# Patient Record
Sex: Male | Born: 1955 | Race: White | Hispanic: No | Smoking: Never smoker
Health system: Southern US, Community
[De-identification: ages and names within clinical notes are randomized; demographics above are authoritative.]

## PROBLEM LIST (undated history)

## (undated) DIAGNOSIS — I219 Acute myocardial infarction, unspecified: Secondary | ICD-10-CM

## (undated) DIAGNOSIS — T148XXA Other injury of unspecified body region, initial encounter: Secondary | ICD-10-CM

## (undated) DIAGNOSIS — J189 Pneumonia, unspecified organism: Secondary | ICD-10-CM

## (undated) DIAGNOSIS — I10 Essential (primary) hypertension: Secondary | ICD-10-CM

## (undated) DIAGNOSIS — I4891 Unspecified atrial fibrillation: Secondary | ICD-10-CM

## (undated) DIAGNOSIS — G4700 Insomnia, unspecified: Secondary | ICD-10-CM

## (undated) DIAGNOSIS — I2510 Atherosclerotic heart disease of native coronary artery without angina pectoris: Secondary | ICD-10-CM

## (undated) DIAGNOSIS — Z8719 Personal history of other diseases of the digestive system: Secondary | ICD-10-CM

## (undated) DIAGNOSIS — I509 Heart failure, unspecified: Secondary | ICD-10-CM

## (undated) DIAGNOSIS — R350 Frequency of micturition: Secondary | ICD-10-CM

## (undated) DIAGNOSIS — J45909 Unspecified asthma, uncomplicated: Secondary | ICD-10-CM

## (undated) DIAGNOSIS — K7460 Unspecified cirrhosis of liver: Secondary | ICD-10-CM

## (undated) DIAGNOSIS — Z950 Presence of cardiac pacemaker: Secondary | ICD-10-CM

## (undated) DIAGNOSIS — N183 Chronic kidney disease, stage 3 unspecified: Secondary | ICD-10-CM

## (undated) DIAGNOSIS — K529 Noninfective gastroenteritis and colitis, unspecified: Secondary | ICD-10-CM

## (undated) DIAGNOSIS — R3915 Urgency of urination: Secondary | ICD-10-CM

## (undated) DIAGNOSIS — M109 Gout, unspecified: Secondary | ICD-10-CM

## (undated) DIAGNOSIS — F329 Major depressive disorder, single episode, unspecified: Secondary | ICD-10-CM

## (undated) DIAGNOSIS — K649 Unspecified hemorrhoids: Secondary | ICD-10-CM

## (undated) DIAGNOSIS — R112 Nausea with vomiting, unspecified: Secondary | ICD-10-CM

## (undated) DIAGNOSIS — F32A Depression, unspecified: Secondary | ICD-10-CM

## (undated) DIAGNOSIS — G629 Polyneuropathy, unspecified: Secondary | ICD-10-CM

## (undated) DIAGNOSIS — N400 Enlarged prostate without lower urinary tract symptoms: Secondary | ICD-10-CM

## (undated) DIAGNOSIS — Z95810 Presence of automatic (implantable) cardiac defibrillator: Secondary | ICD-10-CM

## (undated) DIAGNOSIS — E785 Hyperlipidemia, unspecified: Secondary | ICD-10-CM

## (undated) DIAGNOSIS — Z9889 Other specified postprocedural states: Secondary | ICD-10-CM

## (undated) DIAGNOSIS — K219 Gastro-esophageal reflux disease without esophagitis: Secondary | ICD-10-CM

## (undated) DIAGNOSIS — E11610 Type 2 diabetes mellitus with diabetic neuropathic arthropathy: Secondary | ICD-10-CM

## (undated) DIAGNOSIS — K5909 Other constipation: Secondary | ICD-10-CM

## (undated) DIAGNOSIS — R0602 Shortness of breath: Secondary | ICD-10-CM

## (undated) DIAGNOSIS — I639 Cerebral infarction, unspecified: Secondary | ICD-10-CM

## (undated) DIAGNOSIS — K759 Inflammatory liver disease, unspecified: Secondary | ICD-10-CM

## (undated) DIAGNOSIS — Z87440 Personal history of urinary (tract) infections: Secondary | ICD-10-CM

## (undated) DIAGNOSIS — M1990 Unspecified osteoarthritis, unspecified site: Secondary | ICD-10-CM

## (undated) HISTORY — PX: OTHER SURGICAL HISTORY: SHX169

## (undated) HISTORY — PX: ESOPHAGOGASTRODUODENOSCOPY: SHX1529

## (undated) HISTORY — PX: CARDIAC CATHETERIZATION: SHX172

## (undated) HISTORY — PX: COLONOSCOPY: SHX174

## (undated) HISTORY — PX: TOE AMPUTATION: SHX809

---

## 1998-03-03 ENCOUNTER — Inpatient Hospital Stay (HOSPITAL_COMMUNITY): Admission: EM | Admit: 1998-03-03 | Discharge: 1998-03-08 | Payer: Self-pay | Admitting: Emergency Medicine

## 1998-03-10 ENCOUNTER — Other Ambulatory Visit: Admission: RE | Admit: 1998-03-10 | Discharge: 1998-03-10 | Payer: Self-pay

## 1998-04-08 ENCOUNTER — Other Ambulatory Visit: Admission: RE | Admit: 1998-04-08 | Discharge: 1998-04-08 | Payer: Self-pay

## 1998-04-27 ENCOUNTER — Other Ambulatory Visit: Admission: RE | Admit: 1998-04-27 | Discharge: 1998-04-27 | Payer: Self-pay

## 1998-05-23 ENCOUNTER — Inpatient Hospital Stay (HOSPITAL_COMMUNITY): Admission: EM | Admit: 1998-05-23 | Discharge: 1998-05-26 | Payer: Self-pay | Admitting: Emergency Medicine

## 1998-06-02 ENCOUNTER — Other Ambulatory Visit: Admission: RE | Admit: 1998-06-02 | Discharge: 1998-06-02 | Payer: Self-pay

## 1998-06-14 ENCOUNTER — Other Ambulatory Visit: Admission: RE | Admit: 1998-06-14 | Discharge: 1998-06-14 | Payer: Self-pay

## 1998-08-06 ENCOUNTER — Observation Stay (HOSPITAL_COMMUNITY): Admission: EM | Admit: 1998-08-06 | Discharge: 1998-08-07 | Payer: Self-pay | Admitting: *Deleted

## 1998-08-06 ENCOUNTER — Encounter: Payer: Self-pay | Admitting: *Deleted

## 1999-01-09 ENCOUNTER — Encounter: Payer: Self-pay | Admitting: Emergency Medicine

## 1999-01-09 ENCOUNTER — Inpatient Hospital Stay (HOSPITAL_COMMUNITY): Admission: EM | Admit: 1999-01-09 | Discharge: 1999-01-17 | Payer: Self-pay | Admitting: Emergency Medicine

## 1999-07-01 ENCOUNTER — Inpatient Hospital Stay (HOSPITAL_COMMUNITY): Admission: EM | Admit: 1999-07-01 | Discharge: 1999-07-06 | Payer: Self-pay | Admitting: Emergency Medicine

## 1999-07-01 ENCOUNTER — Encounter: Payer: Self-pay | Admitting: Cardiovascular Disease

## 1999-07-05 ENCOUNTER — Encounter: Payer: Self-pay | Admitting: Cardiovascular Disease

## 1999-09-07 ENCOUNTER — Inpatient Hospital Stay (HOSPITAL_COMMUNITY): Admission: EM | Admit: 1999-09-07 | Discharge: 1999-09-08 | Payer: Self-pay | Admitting: Emergency Medicine

## 1999-09-07 ENCOUNTER — Encounter: Payer: Self-pay | Admitting: Cardiovascular Disease

## 2000-07-23 ENCOUNTER — Encounter: Payer: Self-pay | Admitting: Cardiovascular Disease

## 2000-07-23 ENCOUNTER — Inpatient Hospital Stay (HOSPITAL_COMMUNITY): Admission: EM | Admit: 2000-07-23 | Discharge: 2000-07-28 | Payer: Self-pay | Admitting: Emergency Medicine

## 2001-12-12 ENCOUNTER — Inpatient Hospital Stay (HOSPITAL_COMMUNITY): Admission: EM | Admit: 2001-12-12 | Discharge: 2001-12-13 | Payer: Self-pay | Admitting: *Deleted

## 2003-05-04 ENCOUNTER — Inpatient Hospital Stay (HOSPITAL_COMMUNITY): Admission: EM | Admit: 2003-05-04 | Discharge: 2003-05-13 | Payer: Self-pay | Admitting: Emergency Medicine

## 2003-05-04 ENCOUNTER — Encounter: Payer: Self-pay | Admitting: Emergency Medicine

## 2003-05-05 ENCOUNTER — Encounter: Payer: Self-pay | Admitting: Internal Medicine

## 2003-06-14 ENCOUNTER — Inpatient Hospital Stay (HOSPITAL_COMMUNITY): Admission: EM | Admit: 2003-06-14 | Discharge: 2003-06-15 | Payer: Self-pay | Admitting: *Deleted

## 2003-10-22 ENCOUNTER — Emergency Department (HOSPITAL_COMMUNITY): Admission: EM | Admit: 2003-10-22 | Discharge: 2003-10-22 | Payer: Self-pay | Admitting: Emergency Medicine

## 2003-11-03 ENCOUNTER — Ambulatory Visit (HOSPITAL_COMMUNITY): Admission: RE | Admit: 2003-11-03 | Discharge: 2003-11-03 | Payer: Self-pay | Admitting: Gastroenterology

## 2004-06-06 ENCOUNTER — Inpatient Hospital Stay (HOSPITAL_COMMUNITY): Admission: EM | Admit: 2004-06-06 | Discharge: 2004-06-08 | Payer: Self-pay | Admitting: Emergency Medicine

## 2004-06-10 ENCOUNTER — Inpatient Hospital Stay (HOSPITAL_COMMUNITY): Admission: EM | Admit: 2004-06-10 | Discharge: 2004-06-14 | Payer: Self-pay | Admitting: Emergency Medicine

## 2004-08-02 ENCOUNTER — Inpatient Hospital Stay (HOSPITAL_COMMUNITY): Admission: EM | Admit: 2004-08-02 | Discharge: 2004-08-04 | Payer: Self-pay | Admitting: Emergency Medicine

## 2004-08-17 ENCOUNTER — Inpatient Hospital Stay (HOSPITAL_COMMUNITY): Admission: EM | Admit: 2004-08-17 | Discharge: 2004-08-18 | Payer: Self-pay | Admitting: Emergency Medicine

## 2005-02-22 ENCOUNTER — Encounter: Admission: RE | Admit: 2005-02-22 | Discharge: 2005-02-22 | Payer: Self-pay | Admitting: Cardiovascular Disease

## 2005-02-27 ENCOUNTER — Inpatient Hospital Stay (HOSPITAL_COMMUNITY): Admission: RE | Admit: 2005-02-27 | Discharge: 2005-02-28 | Payer: Self-pay | Admitting: *Deleted

## 2006-04-09 ENCOUNTER — Inpatient Hospital Stay (HOSPITAL_COMMUNITY): Admission: EM | Admit: 2006-04-09 | Discharge: 2006-04-11 | Payer: Self-pay | Admitting: Emergency Medicine

## 2007-01-25 ENCOUNTER — Ambulatory Visit (HOSPITAL_COMMUNITY): Admission: RE | Admit: 2007-01-25 | Discharge: 2007-01-25 | Payer: Self-pay | Admitting: Cardiovascular Disease

## 2007-07-29 ENCOUNTER — Inpatient Hospital Stay (HOSPITAL_COMMUNITY): Admission: EM | Admit: 2007-07-29 | Discharge: 2007-08-01 | Payer: Self-pay | Admitting: Emergency Medicine

## 2007-11-05 ENCOUNTER — Inpatient Hospital Stay (HOSPITAL_COMMUNITY): Admission: EM | Admit: 2007-11-05 | Discharge: 2007-11-08 | Payer: Self-pay | Admitting: Emergency Medicine

## 2007-11-30 ENCOUNTER — Inpatient Hospital Stay (HOSPITAL_COMMUNITY): Admission: EM | Admit: 2007-11-30 | Discharge: 2007-12-05 | Payer: Self-pay | Admitting: *Deleted

## 2009-10-20 ENCOUNTER — Inpatient Hospital Stay (HOSPITAL_COMMUNITY): Admission: EM | Admit: 2009-10-20 | Discharge: 2009-10-25 | Payer: Self-pay | Admitting: Emergency Medicine

## 2009-11-08 ENCOUNTER — Emergency Department (HOSPITAL_COMMUNITY): Admission: EM | Admit: 2009-11-08 | Discharge: 2009-11-08 | Payer: Self-pay | Admitting: Emergency Medicine

## 2010-04-18 ENCOUNTER — Encounter (INDEPENDENT_AMBULATORY_CARE_PROVIDER_SITE_OTHER): Payer: Self-pay | Admitting: Cardiovascular Disease

## 2010-04-19 ENCOUNTER — Encounter (INDEPENDENT_AMBULATORY_CARE_PROVIDER_SITE_OTHER): Payer: Self-pay | Admitting: Cardiovascular Disease

## 2010-10-09 ENCOUNTER — Inpatient Hospital Stay (HOSPITAL_COMMUNITY): Admission: EM | Admit: 2010-10-09 | Discharge: 2010-10-18 | Payer: Self-pay | Admitting: Emergency Medicine

## 2010-10-09 ENCOUNTER — Ambulatory Visit: Payer: Self-pay | Admitting: Internal Medicine

## 2010-11-06 ENCOUNTER — Inpatient Hospital Stay (HOSPITAL_COMMUNITY)
Admission: EM | Admit: 2010-11-06 | Discharge: 2010-11-08 | Payer: Self-pay | Attending: Cardiovascular Disease | Admitting: Cardiovascular Disease

## 2010-11-06 ENCOUNTER — Encounter (INDEPENDENT_AMBULATORY_CARE_PROVIDER_SITE_OTHER): Payer: Self-pay | Admitting: Cardiovascular Disease

## 2011-01-11 ENCOUNTER — Emergency Department (HOSPITAL_COMMUNITY): Payer: MEDICARE

## 2011-01-11 ENCOUNTER — Emergency Department (HOSPITAL_COMMUNITY)
Admission: EM | Admit: 2011-01-11 | Discharge: 2011-01-11 | Disposition: A | Discharge status: Home / Self Care | Payer: MEDICARE | Attending: Emergency Medicine | Admitting: Emergency Medicine

## 2011-01-11 DIAGNOSIS — R0609 Other forms of dyspnea: Secondary | ICD-10-CM | POA: Insufficient documentation

## 2011-01-11 DIAGNOSIS — J45909 Unspecified asthma, uncomplicated: Secondary | ICD-10-CM | POA: Insufficient documentation

## 2011-01-11 DIAGNOSIS — I252 Old myocardial infarction: Secondary | ICD-10-CM | POA: Insufficient documentation

## 2011-01-11 DIAGNOSIS — Z7982 Long term (current) use of aspirin: Secondary | ICD-10-CM | POA: Insufficient documentation

## 2011-01-11 DIAGNOSIS — R0602 Shortness of breath: Secondary | ICD-10-CM | POA: Insufficient documentation

## 2011-01-11 DIAGNOSIS — I2510 Atherosclerotic heart disease of native coronary artery without angina pectoris: Secondary | ICD-10-CM | POA: Insufficient documentation

## 2011-01-11 DIAGNOSIS — E119 Type 2 diabetes mellitus without complications: Secondary | ICD-10-CM | POA: Insufficient documentation

## 2011-01-11 DIAGNOSIS — I10 Essential (primary) hypertension: Secondary | ICD-10-CM | POA: Insufficient documentation

## 2011-01-11 DIAGNOSIS — Z794 Long term (current) use of insulin: Secondary | ICD-10-CM | POA: Insufficient documentation

## 2011-01-11 DIAGNOSIS — Z8673 Personal history of transient ischemic attack (TIA), and cerebral infarction without residual deficits: Secondary | ICD-10-CM | POA: Insufficient documentation

## 2011-01-11 DIAGNOSIS — R5381 Other malaise: Secondary | ICD-10-CM | POA: Insufficient documentation

## 2011-01-11 DIAGNOSIS — Z95810 Presence of automatic (implantable) cardiac defibrillator: Secondary | ICD-10-CM | POA: Insufficient documentation

## 2011-01-11 DIAGNOSIS — R0989 Other specified symptoms and signs involving the circulatory and respiratory systems: Secondary | ICD-10-CM | POA: Insufficient documentation

## 2011-01-11 DIAGNOSIS — Z79899 Other long term (current) drug therapy: Secondary | ICD-10-CM | POA: Insufficient documentation

## 2011-01-11 LAB — DIFFERENTIAL
Basophils Absolute: 0 10*3/uL (ref 0.0–0.1)
Eosinophils Absolute: 0.2 10*3/uL (ref 0.0–0.7)
Lymphocytes Relative: 22 % (ref 12–46)
Lymphs Abs: 1.3 10*3/uL (ref 0.7–4.0)
Monocytes Absolute: 0.5 10*3/uL (ref 0.1–1.0)
Neutro Abs: 3.9 10*3/uL (ref 1.7–7.7)
Neutrophils Relative %: 65 % (ref 43–77)

## 2011-01-11 LAB — CBC
HCT: 40.2 % (ref 39.0–52.0)
Hemoglobin: 13.8 g/dL (ref 13.0–17.0)
MCV: 84.1 fL (ref 78.0–100.0)
Platelets: 226 10*3/uL (ref 150–400)
RDW: 14 % (ref 11.5–15.5)

## 2011-01-11 LAB — GLUCOSE, CAPILLARY
Glucose-Capillary: 97 mg/dL (ref 70–99)
Glucose-Capillary: 112 mg/dL — ABNORMAL HIGH (ref 70–99)

## 2011-01-11 LAB — BASIC METABOLIC PANEL
CO2: 34 mEq/L — ABNORMAL HIGH (ref 19–32)
Chloride: 90 mEq/L — ABNORMAL LOW (ref 96–112)
GFR calc Af Amer: 29 mL/min — ABNORMAL LOW (ref >60)
GFR calc non Af Amer: 24 mL/min — ABNORMAL LOW (ref >60)
Potassium: 3.9 mEq/L (ref 3.5–5.1)

## 2011-01-11 LAB — CK TOTAL AND CKMB (NOT AT ARMC)
Relative Index: 2.8 — ABNORMAL HIGH (ref 0.0–2.5)
Total CK: 435 U/L — ABNORMAL HIGH (ref 7–232)

## 2011-01-11 LAB — BRAIN NATRIURETIC PEPTIDE: Pro B Natriuretic peptide (BNP): 314 pg/mL — ABNORMAL HIGH (ref 0.0–100.0)

## 2011-01-11 LAB — POCT CARDIAC MARKERS: Troponin i, poc: <0.05 ng/mL (ref 0.00–0.09)

## 2011-02-06 LAB — GLUCOSE, CAPILLARY
Glucose-Capillary: 136 mg/dL — ABNORMAL HIGH (ref 70–99)
Glucose-Capillary: 151 mg/dL — ABNORMAL HIGH (ref 70–99)
Glucose-Capillary: 167 mg/dL — ABNORMAL HIGH (ref 70–99)
Glucose-Capillary: 49 mg/dL — ABNORMAL LOW (ref 70–99)
Glucose-Capillary: 67 mg/dL — ABNORMAL LOW (ref 70–99)
Glucose-Capillary: 90 mg/dL (ref 70–99)
Glucose-Capillary: 95 mg/dL (ref 70–99)
Glucose-Capillary: 98 mg/dL (ref 70–99)

## 2011-02-06 LAB — CARDIAC PANEL(CRET KIN+CKTOT+MB+TROPI)
CK, MB: 3.4 ng/mL (ref 0.3–4.0)
CK, MB: 4.2 ng/mL — ABNORMAL HIGH (ref 0.3–4.0)
Relative Index: INVALID (ref 0.0–2.5)
Relative Index: INVALID (ref 0.0–2.5)
Total CK: 60 U/L (ref 7–232)
Total CK: 62 U/L (ref 7–232)
Troponin I: 0.03 ng/mL (ref 0.00–0.06)
Troponin I: 0.04 ng/mL (ref 0.00–0.06)

## 2011-02-06 LAB — COMPREHENSIVE METABOLIC PANEL
ALT: 20 U/L (ref 0–53)
Albumin: 3.7 g/dL (ref 3.5–5.2)
Alkaline Phosphatase: 45 U/L (ref 39–117)
BUN: 22 mg/dL (ref 6–23)
Chloride: 99 mEq/L (ref 96–112)
Glucose, Bld: 85 mg/dL (ref 70–99)
Potassium: 3.5 mEq/L (ref 3.5–5.1)
Sodium: 139 mEq/L (ref 135–145)
Total Bilirubin: 0.8 mg/dL (ref 0.3–1.2)

## 2011-02-06 LAB — MRSA PCR SCREENING: MRSA by PCR: NEGATIVE

## 2011-02-06 LAB — CBC
HCT: 37.3 % — ABNORMAL LOW (ref 39.0–52.0)
MCV: 89.7 fL (ref 78.0–100.0)
Platelets: 211 10*3/uL (ref 150–400)
RBC: 4.16 MIL/uL — ABNORMAL LOW (ref 4.22–5.81)
WBC: 5.6 10*3/uL (ref 4.0–10.5)

## 2011-02-06 LAB — BASIC METABOLIC PANEL
BUN: 27 mg/dL — ABNORMAL HIGH (ref 6–23)
CO2: 32 mEq/L (ref 19–32)
Calcium: 9.8 mg/dL (ref 8.4–10.5)
GFR calc non Af Amer: 37 mL/min — ABNORMAL LOW (ref >60)
Glucose, Bld: 162 mg/dL — ABNORMAL HIGH (ref 70–99)
Sodium: 137 mEq/L (ref 135–145)

## 2011-02-06 LAB — HEMOGLOBIN A1C: Mean Plasma Glucose: 131 mg/dL — ABNORMAL HIGH (ref <117)

## 2011-02-06 LAB — BRAIN NATRIURETIC PEPTIDE: Pro B Natriuretic peptide (BNP): 1642 pg/mL — ABNORMAL HIGH (ref 0.0–100.0)

## 2011-02-07 LAB — GLUCOSE, CAPILLARY
Glucose-Capillary: 101 mg/dL — ABNORMAL HIGH (ref 70–99)
Glucose-Capillary: 112 mg/dL — ABNORMAL HIGH (ref 70–99)
Glucose-Capillary: 114 mg/dL — ABNORMAL HIGH (ref 70–99)
Glucose-Capillary: 114 mg/dL — ABNORMAL HIGH (ref 70–99)
Glucose-Capillary: 120 mg/dL — ABNORMAL HIGH (ref 70–99)
Glucose-Capillary: 126 mg/dL — ABNORMAL HIGH (ref 70–99)
Glucose-Capillary: 136 mg/dL — ABNORMAL HIGH (ref 70–99)
Glucose-Capillary: 142 mg/dL — ABNORMAL HIGH (ref 70–99)
Glucose-Capillary: 143 mg/dL — ABNORMAL HIGH (ref 70–99)
Glucose-Capillary: 145 mg/dL — ABNORMAL HIGH (ref 70–99)
Glucose-Capillary: 169 mg/dL — ABNORMAL HIGH (ref 70–99)
Glucose-Capillary: 170 mg/dL — ABNORMAL HIGH (ref 70–99)
Glucose-Capillary: 184 mg/dL — ABNORMAL HIGH (ref 70–99)
Glucose-Capillary: 199 mg/dL — ABNORMAL HIGH (ref 70–99)
Glucose-Capillary: 242 mg/dL — ABNORMAL HIGH (ref 70–99)
Glucose-Capillary: 245 mg/dL — ABNORMAL HIGH (ref 70–99)
Glucose-Capillary: 283 mg/dL — ABNORMAL HIGH (ref 70–99)
Glucose-Capillary: 301 mg/dL — ABNORMAL HIGH (ref 70–99)
Glucose-Capillary: 59 mg/dL — ABNORMAL LOW (ref 70–99)
Glucose-Capillary: 77 mg/dL (ref 70–99)

## 2011-02-07 LAB — CBC
HCT: 32.6 % — ABNORMAL LOW (ref 39.0–52.0)
HCT: 32.9 % — ABNORMAL LOW (ref 39.0–52.0)
HCT: 34.1 % — ABNORMAL LOW (ref 39.0–52.0)
HCT: 34.2 % — ABNORMAL LOW (ref 39.0–52.0)
HCT: 36.7 % — ABNORMAL LOW (ref 39.0–52.0)
HCT: 36.8 % — ABNORMAL LOW (ref 39.0–52.0)
HCT: 37.9 % — ABNORMAL LOW (ref 39.0–52.0)
HCT: 39.3 % (ref 39.0–52.0)
Hemoglobin: 10.6 g/dL — ABNORMAL LOW (ref 13.0–17.0)
Hemoglobin: 11.1 g/dL — ABNORMAL LOW (ref 13.0–17.0)
Hemoglobin: 11.8 g/dL — ABNORMAL LOW (ref 13.0–17.0)
Hemoglobin: 12.8 g/dL — ABNORMAL LOW (ref 13.0–17.0)
Hemoglobin: 13.5 g/dL (ref 13.0–17.0)
MCH: 30.1 pg (ref 26.0–34.0)
MCH: 30.3 pg (ref 26.0–34.0)
MCH: 30.5 pg (ref 26.0–34.0)
MCH: 30.6 pg (ref 26.0–34.0)
MCH: 30.8 pg (ref 26.0–34.0)
MCH: 31.1 pg (ref 26.0–34.0)
MCH: 31.4 pg (ref 26.0–34.0)
MCHC: 33.1 g/dL (ref 30.0–36.0)
MCHC: 34 g/dL (ref 30.0–36.0)
MCHC: 34 g/dL (ref 30.0–36.0)
MCHC: 34.4 g/dL (ref 30.0–36.0)
MCHC: 34.4 g/dL (ref 30.0–36.0)
MCHC: 34.6 g/dL (ref 30.0–36.0)
MCHC: 34.9 g/dL (ref 30.0–36.0)
MCV: 87.6 fL (ref 78.0–100.0)
MCV: 89.1 fL (ref 78.0–100.0)
MCV: 89.3 fL (ref 78.0–100.0)
MCV: 90 fL (ref 78.0–100.0)
MCV: 90.9 fL (ref 78.0–100.0)
Platelets: 136 10*3/uL — ABNORMAL LOW (ref 150–400)
Platelets: 145 10*3/uL — ABNORMAL LOW (ref 150–400)
Platelets: 162 10*3/uL (ref 150–400)
RBC: 3.54 MIL/uL — ABNORMAL LOW (ref 4.22–5.81)
RBC: 3.8 MIL/uL — ABNORMAL LOW (ref 4.22–5.81)
RBC: 3.8 MIL/uL — ABNORMAL LOW (ref 4.22–5.81)
RBC: 4.52 MIL/uL (ref 4.22–5.81)
RDW: 13.5 % (ref 11.5–15.5)
RDW: 13.6 % (ref 11.5–15.5)
RDW: 13.8 % (ref 11.5–15.5)
RDW: 13.9 % (ref 11.5–15.5)
RDW: 14 % (ref 11.5–15.5)
RDW: 14.4 % (ref 11.5–15.5)
WBC: 5.1 10*3/uL (ref 4.0–10.5)
WBC: 7.4 10*3/uL (ref 4.0–10.5)

## 2011-02-07 LAB — BASIC METABOLIC PANEL
BUN: 18 mg/dL (ref 6–23)
BUN: 23 mg/dL (ref 6–23)
BUN: 24 mg/dL — ABNORMAL HIGH (ref 6–23)
BUN: 25 mg/dL — ABNORMAL HIGH (ref 6–23)
BUN: 33 mg/dL — ABNORMAL HIGH (ref 6–23)
CO2: 22 mEq/L (ref 19–32)
CO2: 25 mEq/L (ref 19–32)
CO2: 26 mEq/L (ref 19–32)
CO2: 27 mEq/L (ref 19–32)
CO2: 31 mEq/L (ref 19–32)
Calcium: 8.5 mg/dL (ref 8.4–10.5)
Calcium: 8.7 mg/dL (ref 8.4–10.5)
Calcium: 8.8 mg/dL (ref 8.4–10.5)
Calcium: 8.8 mg/dL (ref 8.4–10.5)
Calcium: 9 mg/dL (ref 8.4–10.5)
Calcium: 9.1 mg/dL (ref 8.4–10.5)
Calcium: 9.2 mg/dL (ref 8.4–10.5)
Calcium: 9.2 mg/dL (ref 8.4–10.5)
Chloride: 101 mEq/L (ref 96–112)
Chloride: 103 mEq/L (ref 96–112)
Chloride: 105 mEq/L (ref 96–112)
Chloride: 108 mEq/L (ref 96–112)
Creatinine, Ser: 1.46 mg/dL (ref 0.4–1.5)
Creatinine, Ser: 1.51 mg/dL — ABNORMAL HIGH (ref 0.4–1.5)
Creatinine, Ser: 1.6 mg/dL — ABNORMAL HIGH (ref 0.4–1.5)
Creatinine, Ser: 1.62 mg/dL — ABNORMAL HIGH (ref 0.4–1.5)
Creatinine, Ser: 1.63 mg/dL — ABNORMAL HIGH (ref 0.4–1.5)
GFR calc Af Amer: 46 mL/min — ABNORMAL LOW (ref >60)
GFR calc Af Amer: 54 mL/min — ABNORMAL LOW (ref >60)
GFR calc Af Amer: 58 mL/min — ABNORMAL LOW (ref >60)
GFR calc Af Amer: >60 mL/min (ref >60)
GFR calc non Af Amer: 38 mL/min — ABNORMAL LOW (ref >60)
GFR calc non Af Amer: 42 mL/min — ABNORMAL LOW (ref >60)
GFR calc non Af Amer: 45 mL/min — ABNORMAL LOW (ref >60)
GFR calc non Af Amer: 46 mL/min — ABNORMAL LOW (ref >60)
GFR calc non Af Amer: 50 mL/min — ABNORMAL LOW (ref >60)
Glucose, Bld: 113 mg/dL — ABNORMAL HIGH (ref 70–99)
Glucose, Bld: 130 mg/dL — ABNORMAL HIGH (ref 70–99)
Glucose, Bld: 148 mg/dL — ABNORMAL HIGH (ref 70–99)
Glucose, Bld: 148 mg/dL — ABNORMAL HIGH (ref 70–99)
Glucose, Bld: 150 mg/dL — ABNORMAL HIGH (ref 70–99)
Glucose, Bld: 157 mg/dL — ABNORMAL HIGH (ref 70–99)
Glucose, Bld: 191 mg/dL — ABNORMAL HIGH (ref 70–99)
Glucose, Bld: 87 mg/dL (ref 70–99)
Potassium: 3.8 mEq/L (ref 3.5–5.1)
Potassium: 3.8 mEq/L (ref 3.5–5.1)
Potassium: 4 mEq/L (ref 3.5–5.1)
Potassium: 4 mEq/L (ref 3.5–5.1)
Sodium: 136 mEq/L (ref 135–145)
Sodium: 137 mEq/L (ref 135–145)
Sodium: 139 mEq/L (ref 135–145)

## 2011-02-07 LAB — CK TOTAL AND CKMB (NOT AT ARMC)
CK, MB: 8.8 ng/mL (ref 0.3–4.0)
Total CK: 165 U/L (ref 7–232)

## 2011-02-07 LAB — HEPARIN LEVEL (UNFRACTIONATED)
Heparin Unfractionated: 0.14 IU/mL — ABNORMAL LOW (ref 0.30–0.70)
Heparin Unfractionated: 0.17 IU/mL — ABNORMAL LOW (ref 0.30–0.70)
Heparin Unfractionated: 0.2 IU/mL — ABNORMAL LOW (ref 0.30–0.70)
Heparin Unfractionated: 0.3 IU/mL (ref 0.30–0.70)
Heparin Unfractionated: 0.4 IU/mL (ref 0.30–0.70)
Heparin Unfractionated: <0.1 IU/mL — ABNORMAL LOW (ref 0.30–0.70)
Heparin Unfractionated: <0.1 IU/mL — ABNORMAL LOW (ref 0.30–0.70)
Heparin Unfractionated: <0.1 IU/mL — ABNORMAL LOW (ref 0.30–0.70)

## 2011-02-07 LAB — TROPONIN I: Troponin I: 0.12 ng/mL — ABNORMAL HIGH (ref 0.00–0.06)

## 2011-02-07 LAB — URINALYSIS, ROUTINE W REFLEX MICROSCOPIC
Ketones, ur: NEGATIVE mg/dL
Leukocytes, UA: NEGATIVE
Nitrite: NEGATIVE
Protein, ur: 30 mg/dL — AB

## 2011-02-07 LAB — COMPREHENSIVE METABOLIC PANEL
ALT: 22 U/L (ref 0–53)
BUN: 40 mg/dL — ABNORMAL HIGH (ref 6–23)
CO2: 30 mEq/L (ref 19–32)
Calcium: 9 mg/dL (ref 8.4–10.5)
Creatinine, Ser: 1.98 mg/dL — ABNORMAL HIGH (ref 0.4–1.5)
GFR calc non Af Amer: 35 mL/min — ABNORMAL LOW (ref >60)
Glucose, Bld: 215 mg/dL — ABNORMAL HIGH (ref 70–99)
Sodium: 140 mEq/L (ref 135–145)

## 2011-02-07 LAB — CARDIAC PANEL(CRET KIN+CKTOT+MB+TROPI)
CK, MB: 6.4 ng/mL (ref 0.3–4.0)
Total CK: 172 U/L (ref 7–232)
Troponin I: 0.44 ng/mL — ABNORMAL HIGH (ref 0.00–0.06)

## 2011-02-07 LAB — URINE CULTURE
Colony Count: NO GROWTH
Culture: NO GROWTH

## 2011-02-07 LAB — URINE MICROSCOPIC-ADD ON

## 2011-02-07 LAB — PLATELET INHIBITION P2Y12
P2Y12 % Inhibition: 0 %
Platelet Function  P2Y12: 365 [PRU] (ref 194–418)

## 2011-02-07 LAB — BRAIN NATRIURETIC PEPTIDE
Pro B Natriuretic peptide (BNP): 1753 pg/mL — ABNORMAL HIGH (ref 0.0–100.0)
Pro B Natriuretic peptide (BNP): 1897 pg/mL — ABNORMAL HIGH (ref 0.0–100.0)
Pro B Natriuretic peptide (BNP): 2576 pg/mL — ABNORMAL HIGH (ref 0.0–100.0)

## 2011-02-07 LAB — DIFFERENTIAL
Basophils Absolute: 0 10*3/uL (ref 0.0–0.1)
Basophils Absolute: 0 10*3/uL (ref 0.0–0.1)
Basophils Relative: 0 % (ref 0–1)
Eosinophils Absolute: 0.2 10*3/uL (ref 0.0–0.7)
Eosinophils Relative: 2 % (ref 0–5)
Lymphocytes Relative: 11 % — ABNORMAL LOW (ref 12–46)
Monocytes Absolute: 0.5 10*3/uL (ref 0.1–1.0)
Neutro Abs: 6.8 10*3/uL (ref 1.7–7.7)
Neutrophils Relative %: 84 % — ABNORMAL HIGH (ref 43–77)

## 2011-02-07 LAB — POTASSIUM: Potassium: 4.1 mEq/L (ref 3.5–5.1)

## 2011-02-07 LAB — PROTIME-INR
INR: 1.03 (ref 0.00–1.49)
INR: 1.06 (ref 0.00–1.49)
Prothrombin Time: 13.7 seconds (ref 11.6–15.2)

## 2011-02-07 LAB — MAGNESIUM
Magnesium: 1.6 mg/dL (ref 1.5–2.5)
Magnesium: 2 mg/dL (ref 1.5–2.5)

## 2011-02-14 ENCOUNTER — Observation Stay (HOSPITAL_COMMUNITY)
Admission: EM | Admit: 2011-02-14 | Discharge: 2011-02-18 | DRG: 251 | Disposition: A | Discharge status: Home / Self Care | Payer: Medicare Other | Source: Ambulatory Visit | Attending: Cardiovascular Disease | Admitting: Cardiovascular Disease

## 2011-02-14 ENCOUNTER — Inpatient Hospital Stay (HOSPITAL_COMMUNITY): Payer: Medicare Other

## 2011-02-14 ENCOUNTER — Emergency Department (HOSPITAL_COMMUNITY): Payer: Medicare Other

## 2011-02-14 DIAGNOSIS — I252 Old myocardial infarction: Secondary | ICD-10-CM | POA: Insufficient documentation

## 2011-02-14 DIAGNOSIS — I2582 Chronic total occlusion of coronary artery: Secondary | ICD-10-CM | POA: Insufficient documentation

## 2011-02-14 DIAGNOSIS — Z01810 Encounter for preprocedural cardiovascular examination: Secondary | ICD-10-CM | POA: Insufficient documentation

## 2011-02-14 DIAGNOSIS — R0602 Shortness of breath: Secondary | ICD-10-CM | POA: Insufficient documentation

## 2011-02-14 DIAGNOSIS — B1920 Unspecified viral hepatitis C without hepatic coma: Secondary | ICD-10-CM | POA: Insufficient documentation

## 2011-02-14 DIAGNOSIS — Z01818 Encounter for other preprocedural examination: Secondary | ICD-10-CM | POA: Insufficient documentation

## 2011-02-14 DIAGNOSIS — I4729 Other ventricular tachycardia: Principal | ICD-10-CM | POA: Insufficient documentation

## 2011-02-14 DIAGNOSIS — I2510 Atherosclerotic heart disease of native coronary artery without angina pectoris: Secondary | ICD-10-CM | POA: Insufficient documentation

## 2011-02-14 DIAGNOSIS — Z95810 Presence of automatic (implantable) cardiac defibrillator: Secondary | ICD-10-CM | POA: Insufficient documentation

## 2011-02-14 DIAGNOSIS — N183 Chronic kidney disease, stage 3 unspecified: Secondary | ICD-10-CM | POA: Insufficient documentation

## 2011-02-14 DIAGNOSIS — I2589 Other forms of chronic ischemic heart disease: Secondary | ICD-10-CM | POA: Insufficient documentation

## 2011-02-14 DIAGNOSIS — E785 Hyperlipidemia, unspecified: Secondary | ICD-10-CM | POA: Insufficient documentation

## 2011-02-14 DIAGNOSIS — R61 Generalized hyperhidrosis: Secondary | ICD-10-CM | POA: Insufficient documentation

## 2011-02-14 DIAGNOSIS — I129 Hypertensive chronic kidney disease with stage 1 through stage 4 chronic kidney disease, or unspecified chronic kidney disease: Secondary | ICD-10-CM | POA: Insufficient documentation

## 2011-02-14 DIAGNOSIS — I200 Unstable angina: Secondary | ICD-10-CM | POA: Insufficient documentation

## 2011-02-14 DIAGNOSIS — I472 Ventricular tachycardia, unspecified: Principal | ICD-10-CM | POA: Insufficient documentation

## 2011-02-14 DIAGNOSIS — Z8673 Personal history of transient ischemic attack (TIA), and cerebral infarction without residual deficits: Secondary | ICD-10-CM | POA: Insufficient documentation

## 2011-02-14 DIAGNOSIS — E876 Hypokalemia: Secondary | ICD-10-CM | POA: Insufficient documentation

## 2011-02-14 DIAGNOSIS — Z9861 Coronary angioplasty status: Secondary | ICD-10-CM | POA: Insufficient documentation

## 2011-02-14 DIAGNOSIS — Z79899 Other long term (current) drug therapy: Secondary | ICD-10-CM | POA: Insufficient documentation

## 2011-02-14 DIAGNOSIS — R079 Chest pain, unspecified: Secondary | ICD-10-CM | POA: Insufficient documentation

## 2011-02-14 DIAGNOSIS — E119 Type 2 diabetes mellitus without complications: Secondary | ICD-10-CM | POA: Insufficient documentation

## 2011-02-14 DIAGNOSIS — Z01812 Encounter for preprocedural laboratory examination: Secondary | ICD-10-CM | POA: Insufficient documentation

## 2011-02-14 LAB — DIFFERENTIAL
Basophils Absolute: 0 10*3/uL (ref 0.0–0.1)
Basophils Relative: 0 % (ref 0–1)
Eosinophils Relative: 5 % (ref 0–5)
Lymphocytes Relative: 13 % (ref 12–46)
Neutro Abs: 4.3 10*3/uL (ref 1.7–7.7)

## 2011-02-14 LAB — POCT CARDIAC MARKERS
CKMB, poc: 2.2 ng/mL (ref 1.0–8.0)
Myoglobin, poc: 224 ng/mL (ref 12–200)

## 2011-02-14 LAB — POCT I-STAT, CHEM 8
HCT: 38 % — ABNORMAL LOW (ref 39.0–52.0)
Hemoglobin: 12.9 g/dL — ABNORMAL LOW (ref 13.0–17.0)
Potassium: 3.4 mEq/L — ABNORMAL LOW (ref 3.5–5.1)
Sodium: 137 mEq/L (ref 135–145)
TCO2: 30 mmol/L (ref 0–100)

## 2011-02-14 LAB — COMPREHENSIVE METABOLIC PANEL
BUN: 48 mg/dL — ABNORMAL HIGH (ref 6–23)
CO2: 28 mEq/L (ref 19–32)
Calcium: 9.9 mg/dL (ref 8.4–10.5)
Creatinine, Ser: 2.45 mg/dL — ABNORMAL HIGH (ref 0.4–1.5)
GFR calc non Af Amer: 28 mL/min — ABNORMAL LOW (ref >60)
Glucose, Bld: 132 mg/dL — ABNORMAL HIGH (ref 70–99)
Total Protein: 7.2 g/dL (ref 6.0–8.3)

## 2011-02-14 LAB — GLUCOSE, CAPILLARY: Glucose-Capillary: 173 mg/dL — ABNORMAL HIGH (ref 70–99)

## 2011-02-14 LAB — CBC
HCT: 36.6 % — ABNORMAL LOW (ref 39.0–52.0)
Hemoglobin: 12.7 g/dL — ABNORMAL LOW (ref 13.0–17.0)
RDW: 14.4 % (ref 11.5–15.5)
WBC: 5.8 10*3/uL (ref 4.0–10.5)

## 2011-02-14 LAB — PROTIME-INR
INR: 1.08 (ref 0.00–1.49)
Prothrombin Time: 14.2 seconds (ref 11.6–15.2)

## 2011-02-14 LAB — MAGNESIUM: Magnesium: 1.6 mg/dL (ref 1.5–2.5)

## 2011-02-14 LAB — APTT: aPTT: 30 seconds (ref 24–37)

## 2011-02-15 LAB — CARDIAC PANEL(CRET KIN+CKTOT+MB+TROPI)
Relative Index: INVALID (ref 0.0–2.5)
Total CK: 88 U/L (ref 7–232)

## 2011-02-15 LAB — GLUCOSE, CAPILLARY
Glucose-Capillary: 124 mg/dL — ABNORMAL HIGH (ref 70–99)
Glucose-Capillary: 216 mg/dL — ABNORMAL HIGH (ref 70–99)
Glucose-Capillary: 187 mg/dL — ABNORMAL HIGH (ref 70–99)

## 2011-02-15 LAB — BASIC METABOLIC PANEL
BUN: 45 mg/dL — ABNORMAL HIGH (ref 6–23)
Calcium: 9.6 mg/dL (ref 8.4–10.5)
Creatinine, Ser: 2.19 mg/dL — ABNORMAL HIGH (ref 0.4–1.5)
GFR calc Af Amer: 38 mL/min — ABNORMAL LOW (ref >60)

## 2011-02-15 LAB — MRSA PCR SCREENING: MRSA by PCR: NEGATIVE

## 2011-02-16 LAB — BASIC METABOLIC PANEL
BUN: 38 mg/dL — ABNORMAL HIGH (ref 6–23)
Calcium: 9.5 mg/dL (ref 8.4–10.5)
Creatinine, Ser: 2.21 mg/dL — ABNORMAL HIGH (ref 0.4–1.5)
GFR calc non Af Amer: 31 mL/min — ABNORMAL LOW (ref >60)
Glucose, Bld: 197 mg/dL — ABNORMAL HIGH (ref 70–99)
Potassium: 3.5 mEq/L (ref 3.5–5.1)

## 2011-02-16 LAB — CBC
HCT: 37 % — ABNORMAL LOW (ref 39.0–52.0)
MCHC: 34.6 g/dL (ref 30.0–36.0)
MCV: 83.1 fL (ref 78.0–100.0)
Platelets: 158 10*3/uL (ref 150–400)
RDW: 14.3 % (ref 11.5–15.5)
WBC: 4.3 10*3/uL (ref 4.0–10.5)

## 2011-02-16 LAB — CARDIAC PANEL(CRET KIN+CKTOT+MB+TROPI)
CK, MB: 2.7 ng/mL (ref 0.3–4.0)
Relative Index: INVALID (ref 0.0–2.5)
Total CK: 47 U/L (ref 7–232)

## 2011-02-16 LAB — GLUCOSE, CAPILLARY: Glucose-Capillary: 165 mg/dL — ABNORMAL HIGH (ref 70–99)

## 2011-02-17 LAB — BASIC METABOLIC PANEL
BUN: 31 mg/dL — ABNORMAL HIGH (ref 6–23)
CO2: 28 mEq/L (ref 19–32)
Chloride: 99 mEq/L (ref 96–112)
Glucose, Bld: 167 mg/dL — ABNORMAL HIGH (ref 70–99)
Potassium: 3.8 mEq/L (ref 3.5–5.1)

## 2011-02-17 LAB — CBC
HCT: 34.4 % — ABNORMAL LOW (ref 39.0–52.0)
MCV: 82.7 fL (ref 78.0–100.0)
RBC: 4.16 MIL/uL — ABNORMAL LOW (ref 4.22–5.81)
WBC: 4.6 10*3/uL (ref 4.0–10.5)

## 2011-02-17 LAB — GLUCOSE, CAPILLARY
Glucose-Capillary: 227 mg/dL — ABNORMAL HIGH (ref 70–99)
Glucose-Capillary: 267 mg/dL — ABNORMAL HIGH (ref 70–99)

## 2011-02-17 LAB — HEPARIN LEVEL (UNFRACTIONATED)
Heparin Unfractionated: 0.26 IU/mL — ABNORMAL LOW (ref 0.30–0.70)
Heparin Unfractionated: 0.38 IU/mL (ref 0.30–0.70)

## 2011-02-18 LAB — CBC
MCH: 28.6 pg (ref 26.0–34.0)
MCHC: 33.9 g/dL (ref 30.0–36.0)
MCV: 84.3 fL (ref 78.0–100.0)
Platelets: 154 10*3/uL (ref 150–400)
RBC: 4.2 MIL/uL — ABNORMAL LOW (ref 4.22–5.81)
RDW: 14.6 % (ref 11.5–15.5)

## 2011-02-18 LAB — BASIC METABOLIC PANEL
BUN: 29 mg/dL — ABNORMAL HIGH (ref 6–23)
Chloride: 100 mEq/L (ref 96–112)
Creatinine, Ser: 1.93 mg/dL — ABNORMAL HIGH (ref 0.4–1.5)

## 2011-02-18 LAB — GLUCOSE, CAPILLARY: Glucose-Capillary: 113 mg/dL — ABNORMAL HIGH (ref 70–99)

## 2011-02-20 NOTE — H&P (Signed)
NAME:  [name], FEDEWA              ACCOUNT NO.:  [id number]  MEDICAL RECORD NO.:  [id number]           PATIENT TYPE:  E  LOCATION:  [facility]                         FACILITY:  [facility]  PHYSICIAN:  Breann Losano A. Alanda Amass, M.D.DATE OF BIRTH:  October 16, 1956  DATE OF ADMISSION:  02/14/2011 DATE OF DISCHARGE:                             HISTORY & PHYSICAL   CHIEF COMPLAINT:  ICD discharge.  HISTORY OF PRESENT ILLNESS:  [name] is a 55 year old male with known ischemic cardiomyopathy.  His EF is in the 15% range.  He has had coronary disease with multiple PCIs.  He had a large anterior MI in 1998.  His last PCI was in November 2011.  He has a Medtronic ICD.  In the past, he has had problems with atrial fibrillation and the ICD firing and he had go on amiodarone.  He had syncope in December 2011, but there were no ICD discharges at that time.  MI was ruled out and was felt at that time that he had acute on chronic volume overload.  He has done pretty well since then.  Today, he was standing at the sink when he suddenly became weak and sweaty.  He said he slumped over into the sink and then his ICD fired and ended up across the kitchen.  After that, he was anxious having chest pain, shortness of breath, and diaphoresis.  He was brought to the emergency room.  His device was interrogated by the nurse in the emergency room and it appears he did have one shock.  He is admitted now for observation.  CURRENT MEDICATIONS:  As best we can tell are, 1. Corgard 40 mg b.i.d. 2. Metformin 850 t.i.d. 3. Prilosec 40 mg a day. 4. Imdur 90 mg a day. 5. Plavix 75 mg a day. 6. He takes Lasix 80 mg in the morning and 40 in the evening. 7. Aspirin 325 mg a day. 8. Gabapentin 300 mg at bedtime. 9. Potassium 20 mEq a day. 10.TriCor 145 daily. 11.Amiodarone 200 mg b.i.d. 12.Hydralazine 25 mg t.i.d. 13.Lantus 45 units a day.  ALLERGIES:  HE IS ALLERGIC TO PENICILLIN, MORPHINE, AND CEPHALOSPORIN.  SOCIAL  HISTORY:  He lives with his brother.  He is a nonsmoker.  FAMILY HISTORY:  Remarkable as father died in his 43s of an MI.  He had a brother died in his 55s of an MI.  REVIEW OF SYSTEMS:  Essentially unremarkable except for noted above.  He denies any palpitations or tachycardia.  PHYSICAL EXAM:  VITAL SIGNS:  Blood pressure 107/58, pulse 70, respirations 16. GENERAL:  He is a well-developed and well-nourished male, in no acute distress. HEENT:  Normocephalic.  Extraocular movements are intact.  Sclerae are anicteric.  Lids and conjunctiva are within normal limits.  NECK: Without bruit or JVD. CHEST:  Clear to auscultation and percussion. CARDIAC:  Reveals regular rate and rhythm without murmur, rub, or gallop. ABDOMEN:  Nontender.  No hepatosplenomegaly. EXTREMITIES:  Without edema.  Distal pulses are 2+/4 bilaterally. NEURO:  Grossly intact.  He is awake, alert, oriented, and cooperative. Moves all extremities without obvious deficit. SKIN:  Cool and dry.  LABORATORY DATA:  Sodium 137, potassium 3.4, BUN 50, creatinine 2.6. White count 5.8, hemoglobin 12.7, hematocrit 36.6, platelets 168. Troponin is negative.  Chest x-ray shows cardiomegaly with low lung volumes.  EKG is paced.  IMPRESSION: 1. ICD discharge. 2. Known severe ischemic cardiomyopathy with an EF of 15%. 3. Coronary disease with multiple past PCIs and history of remote     large anterior wall myocardial infarction, his last catheterization     was in November 2011. 4. Type 2 insulin-dependent diabetes with neuropathy. 5. Stage III chronic renal insufficiency. 6. Treated hypertension. 7. History of hepatitis C. 8. History of dyslipidemia. 9. Medtronic BiV-ICD implant.  PLAN:  The patient was seen by Dr. Tresa Endo and myself in the emergency room.  Clinically, it sounds like an appropriate device shock.  We will go and admit him overnight for further observation.     [name],  P.A.   ______________________________ Pearletha Furl Alanda Amass, M.D.    Lenard Lance  D:  02/14/2011  T:  02/14/2011  Job:  [id number]  cc:   Gerlene Burdock A. Alanda Amass, M.D.  Electronically Signed by Corine Shelter P.A. on 02/15/2011 04:22:03 PM Electronically Signed by Susa Griffins M.D. on 02/20/2011 01:38:16 PM

## 2011-02-20 NOTE — Procedures (Signed)
NAME:  Brandon, [name] NO.:  [id number]  MEDICAL RECORD NO.:  [id number]           PATIENT TYPE:  LOCATION:                                 FACILITY:  PHYSICIAN:  Nicki Guadalajara, M.D.     DATE OF BIRTH:  10/29/1956  DATE OF PROCEDURE:  02/16/2011 DATE OF DISCHARGE:                           CARDIAC CATHETERIZATION   INDICATIONS:  [name] is a 55 year old gentleman who has a history of an ischemic cardiomyopathy.  In 1998, he suffered a large anterior wall myocardial infarction.  He has undergone multiple interventions.  He also has remote history of CVA in the 1990s.  The patient has a history of diabetes mellitus, stage C renal insufficiency, hypertension, remote history of hepatitis C, history of dyslipidemia. He is status post Medtronic BiV ICD implantation.  In November, he did have recurrent defibrillator discharges.  At that time, he ultimately was found to have an EF of approximately 15%.  Catheterization revealed diffuse in-stent restenosis in his LAD system as well as high-grade RCA stenosis in the PDA.  Subsequently, the patient did feel improved.  The patient had been doing fairly well.  On the day of admission, February 14, 2011, he did notice some increasing weakness, fatigue, and then all of a sudden became diaphoretic and sustained a defibrillator shock which bolted him across the room.  He presented to the emergency room where he was admitted.  Analysis did confirm VT-VF shock with restoration of normal-paced rhythm.  At the time of admission, his creatinine was 2.6. He was hypokalemic and received supplemental potassium.  Due to concerns for potential ischemia-mediated VT-VF leading to defibrillator discharge, following hydration he now is brought to the catheterization laboratory.  The patient's creatinine has stabilized to approximately 2.2.  PROCEDURE:  The patient was prepped and draped in usual fashion.  Right femoral artery was  punctured anteriorly and a 5-French sheath was inserted.  Diagnostic catheterization was done with limited contrast totaling approximately 30 mL with selective angiography into the left and right coronary systems.  The right catheter was used to cross the aortic valve to measure LVEDP.  Although initially the plan was to only perform diagnostic catheterization with minimal contrast load with the subtotal/total LAD occlusion proximally leading to the concern for high-grade ischemia contributing to his VT-VF episode with potential for ongoing ischemia if perfusion not improved.  The decision was made to open his totally occluded LAD presently.  Again, this was planned to be done with minimal contrast.  I did discuss this with [name] who agreed to pursue with the intervention recognizing that this potentially may cause some transient worsening renal function.  Of note, an echo Doppler study done late yesterday did suggest an EF in the 35% to 40% range with severe hypocontractility anteriorly and septally and severe hypo to akinesis apically.  The 5-French sheath was upgraded to a 6-French sheath.  A 6- [place] XB LAD 3.5 guide was used.  Bivalirudin was administered.  Since the patient had developed diffuse restenosis despite being on Plavix, decision was made to change his antiplatelet therapy.  Since he previously had  suffered a remote stroke, I made the decision to treat with Brilinta and he received  100 mg bolus with plans for 90 b.i.d. maintenance dosing.  Following therapeutic anticoagulation, Intuition hydrophilic wire was inserted and this was able to cross the total proximal occlusion and advanced to the distal vessel.  Scout angiography now showed very diffuse in-stent restenosis in the entire LAD stented segment as well as very high-grade stenoses in the very distal LAD beyond the last stented segment.  Initial dilatation was done utilizing a 2.5- x 15-mm apex which did  reestablish flow.  A 2.75- x 15-mm cutting balloon was then inserted.  Multiple dilatations were made throughout the entire stented region extending ostially to the mid LAD but this was not able to traverse further down into the mid LAD where there had been previously placed more distal 2.5 stent.  Following multiple dilatations up to 10 atmospheres proximally, a 2.25- x 15-mm cutting balloon was then inserted.  This was able to be advanced distally beyond the most distally placed stent and very low-level inflations at 2 atmospheres were made in the native LAD corresponding to less than 2.2 mm in this very small diffusely diseased distal LAD segment.  Low-level cutting balloon arthrotomy was performed diffusely in this entire region which was not felt to be stentable.  The cutting balloon was then inserted and positioned in the distal aspect of the most distally placed stent with dilatations up to 8-10 atmospheres at this site.  There was significant attention made to minimize contrast use during the intervention.  Since the proximal lesion still had moderate narrowing, a 3.25- x 20-mm noncompliant TREK was then used with dilatation done ostially throughout the entire stented segment up to 10-12 atmospheres proximally with lower levels of inflation up to 6 atmospheres near the distally placed stent. The patient did receive IC nitroglycerin throughout the procedure.  His blood pressure was low throughout the procedure at the start of the procedure, and he did also receive fluid at 150 mL an hour and had received a 200 mL bolus.  Following intervention throughout almost the entire LAD system, scout angiography confirmed an excellent angiographic result.  There was brisk TIMI 3 flow.  The patient was pain free.  He left the catheterization laboratory with stable hemodynamics with a systolic blood pressure of approximately 95-100 with brisk TIMI 3 flow and no evidence for dissection.  Plans  will be to continue the Angiomax drip until the bag runs out and at least approximately 1-2 hours.  The arterial sheath was sutured in place with plans for sheath removal, post Angiomax cessation.  HEMODYNAMIC DATA:  Central aortic pressure was 80/60.  Left ventricle pressure 80/7.  ANGIOGRAPHIC DATA:  Left main coronary artery was a short vessel which bifurcated into an LAD and left circumflex system.  The LAD was diffusely stented from the ostium all the way to the mid segment.  Stents range from 3.0 down to 2.25.  The LAD was totally occluded in the very proximal segment in the region of the very proximal septal perforating artery.  One other view did show this 99% stenosis and then a subsequent view again showed this 100% occluded proximally with TIMI 0 flow.  The circumflex vessel was a large codominant vessel that was free of significant disease and had mild 10% to 20% mid narrowing and in the posterolateral coronary artery.  The right coronary artery was moderate-sized vessel.  The previously placed PDA ostium stented segment was widely patent.  There was no significant RCA disease.  Following complex intervention at multiple sites throughout the entire LAD, the 100% proximally occluded segment was reduced to 0%.  The entireproximal, mid, and distal stented segments were all opened to approximately 0%.  As noted above, initially, there was diffuse 95 plus percent stenoses in the very small distal LAD beyond the most distally placed stent.  These were treated with very low inflation cutting balloon arthrotomy with the 2.25 cutting balloon.  These were reduced to 20% to 30%.  At the end of the procedure, the LAD wrapped around the apex.  There was brisk TIMI 3 flow.  There was no evidence for dissection.  Total contrast used during the procedure including diagnostic plus complex intervention was approximately 125 mL of contrast.  IMPRESSION: 1. Subtotal/total occlusion of  the proximal left anterior descending     with diffuse in-stent restenosis once the total occlusion was     opened and diffuse distal left anterior descending stenoses of 95%     beyond the previously most distally placed stented segment. 2. A 20% mid atrioventricular groove circumflex narrowing. 3. Normal right coronary artery with no restenosis of prior posterior     descending artery ostium intervention site.  [name] most likely suffered an ischemic-mediated VT-VF event leading to defibrillator discharge 2 days previously.  His left anterior descending is now opened.  The patient did have an ischemic cardiomyopathy with an EF of 15% noted in November which did improve to approximately 35% to 40%.  He will be continued with Brilinta 90 mg b.i.d., baby aspirin 81 mg in addition to his other cardiovascular pharmacotherapy.  He left the laboratory in stable condition.          ______________________________ Nicki Guadalajara, M.D.     TK/MEDQ  D:  02/16/2011  T:  02/17/2011  Job:  [id number]  cc:   Gerlene Burdock A. Alanda Amass, M.D.  Electronically Signed by Nicki Guadalajara M.D. on 02/20/2011 04:01:32 PM

## 2011-02-23 NOTE — Discharge Summary (Signed)
NAME:  BELTON, PEPLINSKI NO.:  [id number]  MEDICAL RECORD NO.:  [id number]           PATIENT TYPE:  I  LOCATION:  3730                         FACILITY:  [facility]  PHYSICIAN:  Nicki Guadalajara, M.D.     DATE OF BIRTH:  01-Apr-1956  DATE OF ADMISSION:  02/14/2011 DATE OF DISCHARGE:  02/18/2011                              DISCHARGE SUMMARY   DISCHARGE DIAGNOSES: 1. Implantable cardioverter defibrillator discharge secondary to     ventricular tachycardia and atrial fibrillation secondary to     ischemic episode. 2. Ischemic cardiomyopathy with previous ejection fraction of 15%, but     now by 2-D echo ejection fraction 35-40%. 3. Unstable angina with negative myocardial infarction but new     coronary disease undergoing.  Percutaneous coronary intervention of     the occluded left anterior descending from a very proximal segment     in the region of the very proximal septal perforating artery with     cutting-balloon atherotomy. 4. Previous coronary artery disease with multiple past percutaneous     coronary interventions and removed large anterior wall myocardial     infarction.  Last intervention in November 2011. 5. Type 2 diabetes mellitus, insulin dependent with neuropathy. 6. Stage III chronic renal insufficiency. 7. Treated hypertension. 8. History of hepatitis C. 9. History of dyslipidemia. 10.The patient has Medtronic biventricular implantable cardioverter     defibrillator.  DISCHARGE CONDITION:  Improved.  PROCEDURES:  Combined left heart catheterization with percutaneous coronary intervention, February 16, 2011 including atherotomy.  DISCHARGE INSTRUCTIONS: 1. No work until cleared by Dr. Alanda Amass. 2. Increase activity slowly.  May shower.  No lifting for 1 week.  No     driving for 1 week. 3. Low-sodium, heart-healthy diabetic diet. 4. Wash cath site with soap and water.  Call if any bleeding,     swelling, or drainage. 5. Follow up with Dr.  Alanda Amass, our office will call with date and     time.  DISCHARGE MEDICATIONS:  See medication reconciliation sheet from [facility] but he has been placed on carvedilol and his nadolol was discontinued, his Plavix was discontinued, and he was placed on Ticagrelor.  They also gave him a month of free medication with the card from the company.  HOSPITAL COURSE:  This 55 year old patient was brought by EMS to [facility] after his defibrillator went off while he was at home.  He had suddenly become weak and sweaty and he slumped over into the sink and his ICD fired and then he ended up across the kitchen.  He was having chest pain, shortness of breath, and diaphoretic.  He came to the emergency room.  His device was interrogated.  He had one shock for V- tach and V-fib.  He was admitted to the CCU unit and was seen by Dr. Tresa Endo in the emergency room.  With an appropriate device shock, Dr. Tresa Endo was concerned it was related to an ischemic episode.  His creatinine was elevated, he was given IV fluids for 2 days, and then planned for cardiac catheterization.  Dr. Tresa Endo also felt the Corgard to be changed  today to Coreg which he did do.  The patient continued to be stable and underwent cardiac catheterization as described.  The LAD was 100% occluded within the stents and he underwent cutting-balloon atherotomy and angioplasty with good results.  By the next day, the patient was stable.  We had him to start ambulating, continued to be stable, and by February 18, 2011, he was stable and ready for discharge home.  LABORATORY DATA:  Chemistry at discharge:  Sodium 136, potassium 3.8, chloride 99, CO2 28, glucose 167, BUN 31, creatinine 1.88, and calcium 8.8.  CKs ranged 88-47 with MBs 4.5-2.7.  Troponin I 0.04-0.02.  BNP was 48 on admission, it did climb to 337.  Stool for Hemoccult was negative. MRSA screen was negative.  Glucose average 173 ended at 272.  The patient had also been on heparin during  his wake time for his cardiac cath.  Hemoglobin was 11.8, hematocrit 34.4, WBC 4.6, and platelets 160.  RADIOLOGIC DATA:  Cardiomegaly and low lung volumes, no acute findings. The patient was AV pacing on EKGs.  At discharge, the patient had no complaints and will call the office to arrange for his outpatient appointment with Dr. Alanda Amass.     Darcella Gasman. Annie Paras, N.P.   ______________________________ Nicki Guadalajara, M.D.    LRI/MEDQ  D:  02/18/2011  T:  02/19/2011  Job:  [id number]  cc:   Gerlene Burdock A. Alanda Amass, M.D.  Electronically Signed by Nada Boozer N.P. on 02/21/2011 11:39:23 AM Electronically Signed by Nicki Guadalajara M.D. on 02/23/2011 05:04:31 PM

## 2011-02-28 LAB — CBC
HCT: 36.7 % — ABNORMAL LOW (ref 39.0–52.0)
MCV: 88.4 fL (ref 78.0–100.0)
Platelets: 181 10*3/uL (ref 150–400)
RDW: 13.4 % (ref 11.5–15.5)

## 2011-02-28 LAB — BASIC METABOLIC PANEL
BUN: 19 mg/dL (ref 6–23)
GFR calc non Af Amer: 59 mL/min — ABNORMAL LOW (ref >60)
Glucose, Bld: 243 mg/dL — ABNORMAL HIGH (ref 70–99)
Potassium: 4.2 mEq/L (ref 3.5–5.1)

## 2011-02-28 LAB — PROTIME-INR: Prothrombin Time: 13.7 seconds (ref 11.6–15.2)

## 2011-02-28 LAB — DIFFERENTIAL
Basophils Absolute: 0 10*3/uL (ref 0.0–0.1)
Basophils Relative: 1 % (ref 0–1)
Eosinophils Absolute: 0.3 10*3/uL (ref 0.0–0.7)
Eosinophils Relative: 7 % — ABNORMAL HIGH (ref 0–5)
Lymphocytes Relative: 29 % (ref 12–46)

## 2011-03-01 LAB — DIFFERENTIAL
Basophils Absolute: 0 10*3/uL (ref 0.0–0.1)
Basophils Relative: 0 % (ref 0–1)
Eosinophils Absolute: 0.2 10*3/uL (ref 0.0–0.7)
Eosinophils Absolute: 0.3 10*3/uL (ref 0.0–0.7)
Eosinophils Relative: 4 % (ref 0–5)
Lymphs Abs: 1.3 10*3/uL (ref 0.7–4.0)
Monocytes Absolute: 0.4 10*3/uL (ref 0.1–1.0)
Monocytes Absolute: 0.5 10*3/uL (ref 0.1–1.0)
Monocytes Relative: 6 % (ref 3–12)
Neutro Abs: 6.3 10*3/uL (ref 1.7–7.7)

## 2011-03-01 LAB — LIPID PANEL
HDL: 33 mg/dL — ABNORMAL LOW (ref >39)
Total CHOL/HDL Ratio: 3.2 RATIO
VLDL: 20 mg/dL (ref 0–40)

## 2011-03-01 LAB — GLUCOSE, CAPILLARY
Glucose-Capillary: 178 mg/dL — ABNORMAL HIGH (ref 70–99)
Glucose-Capillary: 188 mg/dL — ABNORMAL HIGH (ref 70–99)
Glucose-Capillary: 202 mg/dL — ABNORMAL HIGH (ref 70–99)
Glucose-Capillary: 221 mg/dL — ABNORMAL HIGH (ref 70–99)
Glucose-Capillary: 225 mg/dL — ABNORMAL HIGH (ref 70–99)
Glucose-Capillary: 235 mg/dL — ABNORMAL HIGH (ref 70–99)
Glucose-Capillary: 249 mg/dL — ABNORMAL HIGH (ref 70–99)
Glucose-Capillary: 253 mg/dL — ABNORMAL HIGH (ref 70–99)
Glucose-Capillary: 265 mg/dL — ABNORMAL HIGH (ref 70–99)
Glucose-Capillary: 272 mg/dL — ABNORMAL HIGH (ref 70–99)
Glucose-Capillary: 284 mg/dL — ABNORMAL HIGH (ref 70–99)
Glucose-Capillary: 308 mg/dL — ABNORMAL HIGH (ref 70–99)
Glucose-Capillary: 311 mg/dL — ABNORMAL HIGH (ref 70–99)
Glucose-Capillary: 338 mg/dL — ABNORMAL HIGH (ref 70–99)

## 2011-03-01 LAB — COMPREHENSIVE METABOLIC PANEL
ALT: 20 U/L (ref 0–53)
AST: 26 U/L (ref 0–37)
Alkaline Phosphatase: 92 U/L (ref 39–117)
CO2: 29 mEq/L (ref 19–32)
Chloride: 92 mEq/L — ABNORMAL LOW (ref 96–112)
Creatinine, Ser: 1.01 mg/dL (ref 0.4–1.5)
GFR calc Af Amer: >60 mL/min (ref >60)
GFR calc non Af Amer: >60 mL/min (ref >60)
Potassium: 3.7 mEq/L (ref 3.5–5.1)
Sodium: 133 mEq/L — ABNORMAL LOW (ref 135–145)
Total Bilirubin: 1.3 mg/dL — ABNORMAL HIGH (ref 0.3–1.2)

## 2011-03-01 LAB — CARDIAC PANEL(CRET KIN+CKTOT+MB+TROPI)
CK, MB: 2 ng/mL (ref 0.3–4.0)
Total CK: 29 U/L (ref 7–232)

## 2011-03-01 LAB — SEDIMENTATION RATE: Sed Rate: 59 mm/hr — ABNORMAL HIGH (ref 0–16)

## 2011-03-01 LAB — CBC
Hemoglobin: 12.3 g/dL — ABNORMAL LOW (ref 13.0–17.0)
MCHC: 35.3 g/dL (ref 30.0–36.0)
MCV: 86.7 fL (ref 78.0–100.0)
MCV: 87.2 fL (ref 78.0–100.0)
RBC: 3.84 MIL/uL — ABNORMAL LOW (ref 4.22–5.81)
RBC: 3.98 MIL/uL — ABNORMAL LOW (ref 4.22–5.81)
WBC: 6 10*3/uL (ref 4.0–10.5)

## 2011-03-01 LAB — CULTURE, BLOOD (ROUTINE X 2): Culture: NO GROWTH

## 2011-03-01 LAB — BASIC METABOLIC PANEL
CO2: 26 mEq/L (ref 19–32)
Chloride: 96 mEq/L (ref 96–112)
GFR calc Af Amer: >60 mL/min (ref >60)
Sodium: 131 mEq/L — ABNORMAL LOW (ref 135–145)

## 2011-03-01 LAB — TROPONIN I: Troponin I: 0.02 ng/mL (ref 0.00–0.06)

## 2011-03-01 LAB — CK TOTAL AND CKMB (NOT AT ARMC): CK, MB: 1.9 ng/mL (ref 0.3–4.0)

## 2011-04-11 NOTE — H&P (Signed)
[name], [name]             ACCOUNT NO.:  [id number]   MEDICAL RECORD NO.:  [id number]          PATIENT TYPE:  INP   LOCATION:  3737                         FACILITY:  [facility]   PHYSICIAN:  John C. Madilyn Fireman, M.D.    DATE OF BIRTH:  29-May-1956   DATE OF ADMISSION:  06/30/2009  DATE OF DISCHARGE:                              HISTORY & PHYSICAL   CHIEF COMPLAINT:  Abdominal pain, nausea, vomiting.   HISTORY OF PRESENT ILLNESS:  A 55 year old man with a past medical  history of CAD, status post stenting to RCA on December 26, 2007,  hypertension, hyperlipidemia, non-insulin-dependent diabetes mellitus,  mild left ventricular dysfunction with EF of 45%, prior history of  pancreatitis admitted to the [facility] because of abdominal pain,  nausea, and vomiting.  The patient states that the day before admission  he started to vomit.  Had 7-8 episodes.  Mostly food.  No blood.  The  patient also describes abdominal pain epigastric in the right upper  quadrant.  Describes sharp localized 9/10 in intensity, aggravated with  coughing.  Nothing alleviated pain.  Associated with cold sweats.  The  patient then on morning of admission developed chest pain.  The patient  denies fever, diarrhea.  Although, he does report some constipation.  The patient states that symptoms have resolved.  Lasted for 18-20 hours.   MEDICATIONS:  1. Aspirin 325 mg.  2. Metformin 1000 mg b.i.d.  3. Gabapentin.  4. Amlodipine 5 mg daily.  5. Glimepiride 4 mg p.o. daily.  6. Plavix 75 mg p.o. daily.  7. Fenofibrate 180 mg.  8. Ranexa 50 mg p.o. b.i.d.  9. Metoprolol 50 mg p.o. b.i.d.  10.Isosorbide 20 mg p.o. t.i.d.   ALLERGIES:  SIMVASTATIN.   SOCIAL HISTORY:  The patient lives in [place].  Divorced.  No  children.  Denies tobacco use.  Alcohol use only on holidays.   REVIEW OF SYSTEMS:  As per HPI.   PHYSICAL EXAMINATION:  VITAL SIGNS:  Temperature 98.3, blood pressure  106/69, heart rate 74,  respiratory rate 16, O2 saturation 95% on room  air.  GENERAL:  NAD.  HEENT:  EOMI, PERRLA.  No exudates.  NECK:  Supple.  RESPIRATORY:  Clear to auscultation bilaterally.  No wheezes, rhonchi,  or crackles.  CARDIOVASCULAR:  Regular rate and rhythm.  No murmurs, rubs, or gallops.  ABDOMEN:  Bowel sounds positive, soft, mild tender to palpation in the  left lower quadrant and right upper quadrant, nondistended, no guarding,  no rebound.  EXTREMITIES:  No edema.  NEUROLOGIC:  Alert and oriented x3.  Nonfocal.   LABORATORY DATA:  White blood cells 10.4, hemoglobin 15.8, hematocrit  45.1, platelets 286.  Sodium 129, potassium 4, chloride 92, bicarbonate  29, BUN 19, creatinine 1.03, glucose 184.  Total bilirubin 0.7, alk phos  88, AST 22, ALT 12, total protein 6.2, albumin 3.2.  Calcium 8.8.  BNP  50.  D-dimer 0.31, magnesium 2.1, amylase 38, lipase 19.  TSH 0.785, CK  total 31, CK-MB 2, troponin 0.03.  Abdominal ultrasound pancreas  obscured, otherwise no acute intraabdominal pathology.  ASSESSMENT AND PLAN:  1. Abdominal pain/nausea/vomiting.  Symptoms have resolved.  Most      likely secondary to gastroenteritis.  We will continue supportive      treatment.  No diarrhea.  Abdominal ultrasound unrevealing as well      as labs.  We will advance diet.  If symptoms recur we will consider      EGD.  The patient will be started on a bland diet and advance as      tolerated.  2. Chest pain resolved.  Cardiac enzymes negative x1.  A 12-lead EKG      revealed LVH with repolarization changes and J-point elevation      unchanged from his prior EKG.  On telemetry, the patient has been      normal sinus, will continue to monitor by PCP.  3. Hyponatremia may be due to decreased p.o. intake. We will consider      changing IV fluids to normal saline.  We will continue to monitor.      Danne Harbor, MD  Electronically Signed     ______________________________  Everardo All. Madilyn Fireman,  M.D.    RV/MEDQ  D:  07/01/2009  T:  07/02/2009  Job:  [id number]

## 2011-04-11 NOTE — Cardiovascular Report (Signed)
NAME:  [name], [name]              ACCOUNT NO.:  [id number]   MEDICAL RECORD NO.:  [id number]          PATIENT TYPE:  INP   LOCATION:  6532                         FACILITY:  [facility]   PHYSICIAN:  Richard A. Alanda Amass, M.D.DATE OF BIRTH:  1956-10-16   DATE OF PROCEDURE:  11/07/2007  DATE OF DISCHARGE:                            CARDIAC CATHETERIZATION   PROCEDURE:  Retrograde central aortic catheterization, selective  coronary angiography via Judkins technique pre-and-post IC nitroglycerin  administration, LV angiogram RAO, LAO projection, subselective LIMA left  subclavian injection, abdominal aortic angiogram hand injection  midstream PA projection.   DESCRIPTION OF PROCEDURE:  The patient brought to the second floor CP  lab without medication because of past difficulties with Valium.  The  right groin was prepped and draped in usual manner, 1% Xylocaine was  used for local anesthesia and RCFA was entered with single anterior  puncture using an 18 thin-wall needle and a 6-French short Daig sidearm  sheath was inserted without difficulty.  Catheterization was done with 6-  French 4-cm taper preformed Cordis coronary and pigtail catheters with a  Teflon-coated J-tip guidewire exchanged throughout the procedure.  IC  nitroglycerin 200 mcg was given at the left coronary where repeat  injections obtained, and IC nitroglycerin 200 was given at the right  coronary with repeat injections obtained.  The subselective LIMA and  left subclavian injection was done by hand which revealed a widely  patent left subclavian artery, antegrade left vertebral flow without  stenosis, and patent ungrafted LIMA.   Abdominal aortic angiogram in the midstream PA projection by hand  injection above the level of the renal artery so single widely patent  renal arteries with no evidence of any significant stenosis or immediate  infrarenal aortic disease.  Catheter was removed.  Side-arm sheath was  flushed,  pending review of the cine angiograms.   PRESSURES:  LV:  148/4; LVEDP 18 mHg; A equal 23-24 mHg  CA:  143/83 mHg. There was no gradient across the aortic valve on  catheter pullback.   Fluoroscopy demonstrated faintly the proximal LAD stent, and very well  the proximal-mid LAD stent recently placed September 2008.  There was +1  left coronary calcification.   LV angiogram in the RAO projection showed hypo-akinesis of a severe  degree from the proximal third of the anterolateral wall around the apex  to the distal third of the inferior wall.  There was akinesis of a small  area at the apex.  There is no significant MR present.   In the LAO projection there was hypokinesis of the septum from the  proximal third to the apex, but there was some preserved wall motion.   Fluoroscopy showed excellent position of the RV, RA, and his LV lead  which was in the mid posterolateral vein with good LV/RV separation.  The patient was BiV pacing during the procedure.   The left main coronary artery was short with no significant disease.   The bare metal stent in the proximal LAD just beyond the ostia appeared  to have about 60% fairly focal InStent restenosis in the junction  of the  mid-and-distal third.  There was some variability depending on views,  but there was good flow with no thrombus present.   There was a small diagonal without significant disease.  Beyond the  first diagonal and septal perforator proximal to the recently placed  DES/LAD stent was an area of about 60% narrowing possibly higher in some  projections, decreased dye density, but no thrombus seen.  The recently  placed DES Promus 2.5/23 stent was widely patent with no thrombus and  good flow.  Beyond the stent there was an area of approximately 70%  narrowing in the mid-LAD and another area of 50% narrowing in the distal  third of the LAD.  The LAD did look like fairly diffusely diseased  diabetic vessel that did bifurcate  at the apex.   The circumflex artery was nondominant, but much smaller larger.  It gave  off a small DX-1 that bifurcated from the proximal third.  There was a  30% to 40% narrowing beyond the atrial branch in the proximal third of  the AV groove circumflex.  A second OM branch from the mid circumflex  had no significant stenosis and a small OM-3 branch filled faintly  antegrade with a 99% ostial-and-proximal stenosis.  The distal  circumflex bifurcated.  It gave off several branches,. and was large  without significant stenosis.   The right coronary was a dominant vessel and very large.  There is 40%  segmental irregularity and narrowing after the proximal RV branch at the  junction of the proximal third.  There was a second and third RV branch  before the acute margin that were widely patent.  The posterolateral  bifurcated.  It was long, moderately large, and no significant disease.  The PDA branch had an 85% stenosis in the proximal third, fairly focal,  in the area of remote POBA.  He also had 50% to 60% disease of the  remainder of the PDA that was diffusely diseased with a relatively  moderate-to-small vessel.   DISCUSSION:  Linard's history is very complex, and well outlined in his  H&P of November 05, 2007.   Essentially he has IDDM, exogenous obesity, premature coronary disease.  He had a remote CVA without recurrence in 1996.  An AWMI in 1998 treated  with POBA with a reperfusion time of 12 hours.  He had subsequent LV  dysfunction that has been there chronically.  He had restenosis of the  ostial proximal LAD, treated with HSRA and bare metal crown stenting  June 09, 1997.  He had multiple studies following that, over the years,  that showed no restenosis; however, he did develop progression of  disease requiring remote POBA to the PDA several years ago.   He unfortunately presented with an AWMI July 30, 2007, with total  occlusion of the LAD beyond the first diagonal  representing a new area  of stenosis.  This was treated on an urgent basis by Dr. Allyson Sabal, and a  DES Promus 2.5/23 stent was placed in this area.  There was a size  mismatch beyond the stent, but good flow was restored; and the patient  was treated medically.  He is status post BiV ICD implant for symptoms  of heart failure and primary prevention on February 27, 2005.  He had been a  clinical responder to BiV pacing, and has not required recurrent  hospitalizations for heart failure.  He has been semi-compliant with his  medication, but has difficulty getting his  medicines because of costs.  He is disabled, and lives with his twin brother, and is divorced with no  children.  He is a nonsmoker.   He recently stopped his Plavix because he ran out of medicines, and did  not have money to buy it for 5 days prior to being seen in the office,  when he had recurrent episodes of chest pain compatible with angina, and  symptoms of heart failure.  He was admitted to the hospital November 05, 2007, myocardial infarction was ruled out with serial enzymes and EKGs.  He was kept on heparin, reloaded with Plavix, and kept on Plavix  therapy, and elective catheterization was done today   The patient has borderline restenosis of his non-DES/LAD stent.  He has  a lesion proximal to the recently placed DES stent in the LAD with 70%  disease beyond this, possibly greater.  He also has 85% restenosis from  a POBA site of the PDA with significant LV dysfunction.   There is no thrombus evident to suggest SAT (subacute thrombosis) and it  is difficult to tell the exact amount of stenosis.  Obviously, any  improvement in perfusion if there is any hemodynamically significant  stenosis might help his anterior wall.  He might also need to be  considered for redilatation of his PDA.  I have discussed and reviewed  the case with Dr. Nanetta Batty; and we felt since he is heparinized,  and he is on antiplatelet therapy,  that IVUS interrogation may be  helpful to determine if PPI would be needed to be considered in this  setting.   CATHETERIZATION DIAGNOSIS:  1. Unstable angina.  2. Recurrent congestive heart failure.  3. Off of Plavix x5 days with unstable angina, status post AWMI      treated with DES stenting July 30, 2007.  4. Remote LAD PTCA (POBA) associated with AWMI in 1998; restenosis      with HSRA and bare metal stenting June 09, 1997.  5. Remote POBA PDA in 2007.  6. IDDM.  7. Hyperlipidemia.  8. Past history of GI bleed with gastritis July 2005, no recurrence.  9. Hepatitis B under the care of Dr. Matthias Hughs, resolved 2006.  10.Remote CVA with focal seizures, none long-term 1996.  11.Chronic parietal lobe vessel abnormalities on last head CT February      2008.  12.Exogenous obesity.  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      Richard A. Alanda Amass, M.D.  Electronically Signed     RAW/MEDQ  D:  11/07/2007  T:  11/07/2007  Job:  [id number]   cc:   CP Lab  Avelino Leeds  Nanetta Batty, M.D.  Bernette Redbird, M.D.

## 2011-04-11 NOTE — Discharge Summary (Signed)
[name], [name] NO.:  [id number]   MEDICAL RECORD NO.:  [id number]           PATIENT TYPE:   LOCATION:                                 FACILITY:   PHYSICIAN:  Nanetta Batty, M.D.   DATE OF BIRTH:  [birth year]   DATE OF ADMISSION:  DATE OF DISCHARGE:                               DISCHARGE SUMMARY   DISCHARGE DIAGNOSES:  1. Coronary disease with prior right coronary artery Cypher stenting,      patent at catheterization this admission.  2. Chronic recanalized left anterior descending and chronic posterior      descending artery and posterolateral artery disease to be treated      medically.  3. Mild left ventricular dysfunction with an ejection fraction of 45%      at cath this admission.  4. Treated hypertension.  5. Dyslipidemia.  6. Non-insulin-dependant diabetes.  7. History of smoking.  8. Family history of coronary disease.   HOSPITAL COURSE:  The patient is a 55 year old male who has had known  coronary disease with prior RCA Cypher stenting in January 2009.  At  that time, he had a recanalized LAD and distal PDA and PLA disease.  He  presented on May 09, 2008, as a possible STEMI.  He was admitted by Dr.  Domingo Sep.  His initial troponins were negative.  He was put on IV  heparin.  Subsequently troponins were also negative.  He was set up for  diagnostic catheterization which was done on May 11, 2008.  This  revealed patent RCA stent with distal PDA and PLA disease which was  unchanged from previous catheterization.  He has normal left main,  normal circumflex, and normal OM with 95% recanalized LAD which was also  unchanged.  He has a 40% diagonal narrowing.  After review of the films,  it was decided to treat him medically.  The patient was seen by Dr.  Jacinto Halim on May 12, 2008, and felt to be stable for discharge.  He will  follow up with Dr. Allyson Sabal.   DISCHARGE MEDICATIONS:  1. Aspirin 81 mg b.i.d.  2. Metoprolol 50 mg b.i.d.  3. Norvasc  5 mg b.i.d.  4. Isosorbide dinitrate 20 mg t.i.d.  5. Glimepiride 4 mg daily.  6. Glucophage 1 gram b.i.d., this will be held until Friday after      discharge.  7. Plavix 75 mg a day.  8. Simvastatin 20 mg nightly.  9. Benazepril 5 mg a day.  10.Allegra 180 mg daily p.r.n.  11.KCl 10 mEq daily.  12.Multivitamin daily.  13.Fish oil 1000 mg capsule daily.   LABS:  White count 7.4, hemoglobin 50.6, hematocrit 44.1, and platelets  272,000.  Sodium 137, potassium 4.2, BUN 8, and creatinine 0.95.  Liver  functions were normal.  CK-MB and troponins were negative.  LDL was 51,  HDL 28, and magnesium 1.8.  H. pylori negative.  TSH 1.51.  CT angiogram  of the chest negative for acute PE or thoracic aortic dissection, there  were some small nodule opacities in the left upper lobe nonspecific,  possibly infectious.  Follow up CT scan is recommended in 12 months.  Chest x-ray shows no acute disease.  INR is 0.90.  EKG shows sinus  rhythm with poor anterior R-wave progression.   DISPOSITION:  The patient is discharged in stable condition and will  follow up with Dr. Allyson Sabal.  The patient will need a follow up CT scan of  his chest in 12 months.      Abelino Derrick, P.A.      Nanetta Batty, M.D.  Electronically Signed    LKK/MEDQ  D:  05/12/2008  T:  05/12/2008  Job:  [id number]   cc:   Nanetta Batty, M.D.

## 2011-04-11 NOTE — Cardiovascular Report (Signed)
NAME:  [name], [name] NO.:  [id number]   MEDICAL RECORD NO.:  [id number]          PATIENT TYPE:  INP   LOCATION:  2807                         FACILITY:  [facility]   PHYSICIAN:  Nanetta Batty, M.D.   DATE OF BIRTH:  Nov 12, 1956   DATE OF PROCEDURE:  DATE OF DISCHARGE:                            CARDIAC CATHETERIZATION   Mr. Mcdonnell is an unfortunate 55 year old moderately overweight white  male with a history of CAD status post inferior wall myocardial  infarction in 1998.  He has had probe of his PDA in April 2007 by Dr.  Jenne Campus.  He had severe LV dysfunction with ICD placed April 2006.  His  other problems include history of the hepatitis B, hypertension, insulin-  dependent diabetes.  He was admitted August 31 with chest pain.  His EKG  was paced.  His enzymes were negative.  He was placed on IV heparin and  nitroglycerin.  He presents now for diagnostic coronary angiography to  define his anatomy.   PROCEDURE DESCRIPTION:  Patient brought to the second floor [facility] in the postabsorptive state.  He was premedicated with  p.o. Valium as well as IV Valium in the lab.  His right groin was  prepped and shaved in the usual sterile fashion; 1% Xylocaine was used  for local anesthesia.  A 6-French sheath was inserted into the right  femoral artery using standard Seldinger technique.  A 6-French, Judkins  diagnostic catheter as well as pigtail catheter were used for selective  coronary angiography and left ventriculography, respectively.  Visipaque  dye was used for the entirety of the case.  Retrograde left ventricular  __________ pressures were recorded.   HEMODYNAMICS:  Aortic systolic pressure 152, diastolic pressure 92, left  ventricular systolic pressure 145, end-diastolic pressure 35.   SELECTIVE CORONARY ANGIOGRAPHY:  1. Left main normal.  2. LAD; LAD had a 60% proximal stenosis followed by a total occlusion      after the first septal  diagonal branch.  3. Left circumflex; free of significant disease.  4. Right coronary; 40% proximal stenosis.  This is a dominant vessel.      There is a 50-60% stenosis in the proximal PDA at the site of prior      __________ .  There are no right-to-left collaterals noted.  5. Left ventriculography; RAO left ventriculogram was performed using      25 mL of Visipaque dye at 12 mL per second.  The overall LVEF was      estimated at approximately 30% with anteroapical akinesia and      severe inferior hypokinesia.   IMPRESSION:  Mr. Fray has a new occluded proximal LAD with  anteroapical akinesia.  This was widely patent approximately 1 year ago.  I suspect this is the cause of his pain.  I am somewhat baffled at why  his enzymes were negative, however.  We will proceed with PCI and  stenting using drug-eluting stent and Angiomax.   PROCEDURE DESCRIPTION:  Existing 6-French sheath in right femoral artery  was exchanged over wire to 7-French sheath.  The patient was on aspirin  and Plavix an outpatient, received 4 baby aspirin and 300 mg of Plavix  in the lab prior to intervention.  He received Angiomax bolus with an  ACT of 400.   Using a 7-French, FL 3.5 guide catheter along 419 [employer] soft wire and a  2.5/12 Maverick for backup, the lesion was crossed with some difficulty  and verification of intraluminal position was obtained.  Following this  progressively increasing in size balloon inflations were performed of  the proximal LAD beginning at 1.5/20 Voyager, 2.0/20 and eventually  2.5/20 voyager.  Following this, a 2.5/23 P __________  jugular drug-  eluting stent was then carefully positioned under fluoroscopic  angiographic control and deployed at 14-15 atmospheres (2.75 mm)  resulting in reduction of the total occlusion to 0% residual.  There was  TIMI III flow at the end of the case.  It should be noted that there was  diffuse distal disease with a 70-80% stenosis distal to  stent  deployment.   IMPRESSION:  Successful PCI and stenting of the proximal occluded left  anterior descending with a dense wall motion abnormality using a drug-  eluting stent.  The Angiomax was discontinued.  Sheaths will be removed.  Patient will be hydrated carefully given his left ventricular end-  diastolic pressure, and his renal function will be reassessed.  He will  be treated with aspirin and Plavix and most likely discharged home in  the next 48-72 hours.  He left the lab in stable condition.      Nanetta Batty, M.D.  Electronically Signed     JB/MEDQ  D:  07/30/2007  T:  07/30/2007  Job:  [id number]   cc:   [facility]  [facility]  Richard A. Alanda Amass, M.D.

## 2011-04-11 NOTE — Discharge Summary (Signed)
Brandon, [name]             ACCOUNT NO.:  [id number]   MEDICAL RECORD NO.:  [id number]          PATIENT TYPE:  INP   LOCATION:  3737                         FACILITY:  [facility]   PHYSICIAN:  Brandon Mends, MD      DATE OF BIRTH:  Nov 15, 1956   DATE OF ADMISSION:  06/30/2009  DATE OF DISCHARGE:  07/03/2009                               DISCHARGE SUMMARY   DISCHARGE DIAGNOSES:  1. Nausea and vomiting, questionable etiology, viral versus      gastroparesis from his diabetes.  2. Chest pain with negative enzymes this admission, known coronary      artery disease, chest pain thought to be atypical and associated      with his nausea and vomiting problems.  He has a history of Cypher      stenting to his right coronary artery on December 26, 2007, was      known chronically occluded and recanalize mid left anterior      descending, also has posterior descending artery and posterolateral      artery disease.  These were small vessels and not amenable to      intervention.  3. Ischemic cardiomyopathy with an ejection fraction of 45% at the      time of cath in 2009.  4. Non-insulin-dependent diabetes mellitus, not well controlled.  His      hemoglobin A1c is 9 here.  Diabetes educator here recommended he be      put on Lantus insulin.  He follows with Dr. Rosiland Oz.  He says they      have gotten his hemoglobin A1c down to 6.9 without any insulin, and      he has been working on this; however, he has been sick from the      last few months.  Thus, we will have him follow up with Dr.      Rosiland Oz for further treatment of his diabetes and we will not put      him on insulin at this time.  5. Hypertension.  6. Hyperlipidemia.  7. Peripheral neuropathy.   CONSULT:  John C. Madilyn Fireman, MD   RADIOLOGY:  1. On July 01, 2009, he had an abdominal ultrasound.  His pancreas      was obscured, otherwise, no acute intraabdominal pathology.  2. On July 03, 2009, he had an abdominal CT scan without  contrast.      No acute abdominal process, coronary and/or aortic calcifications,      lumbar spine DJD.  He had no acute pelvis process.  3. Chest x-ray on June 30, 2009, showed stable chest radiograph      findings.  No acute disease.   LABORATORIES:  CK-MBs and troponins were all negative.  His hemoglobin  was 13.6, hematocrit 38.4, WBC 6.5, and platelets 250.  Hemoglobin A1c  was 9.  Urine had many bacteria, positive nitrites, and small  leukocytes.  BNP was 50.  D-dimer was 0.31.  Sodium 129, potassium 4.0,  chloride 92, CO2 of 29, glucose 184, BUN 19, and creatinine 1.03.  TSH  was 0.785.  Magnesium was 2.1.  Amylase was 38 and lipase was 19.  D-  dimer was 0.31.   HOSPITAL COURSE:  [name] was seen by Dr. Nanetta Batty on June 30, 2009, in our office.  He came in as a walk-in to be evaluated.  He  complained of abdominal pain, nausea, vomiting, and diaphoresis as well  as bilateral arm and chest pain.  This was thought to be all GI related,  but we admitted him to the hospital.  He was ruled out for an MI.  He  was placed on heparin to begin with.  A GI consult was called.  He was  seen by Dr. Madilyn Fireman.  Abdominal ultrasound was done, it was negative for  any pathology.  He then went on to have a CT of his abdomen and pelvis  without contrast.  This was also negative for any pathology.  Dr. Madilyn Fireman  recommended that he have an outpatient colonoscopy.  He felt he could be  discharged home and he would see him as an outpatient in 3-4 weeks.  He  was seen by Dr. Garen Lah on July 03, 2009.  He is considered stable for  discharge home.   DISCHARGE MEDICATIONS:  1. Aspirin 325 mg once a day.  2. Metformin 1000 mg twice per day.  3. Gabapentin 300 mg at bedtime.  4. Amlodipine 5 mg a day.  5. Glimepiride 4 mg every day.  6. Fexofenadine 180 mg every day.  7. Ranexa 1 g twice a day.  8. Metoprolol 50 mg twice a day.  9. Isosorbide 20 mg 3 times per day.  10.B-50 tablets  daily.  11.Cinnamon Plus Chromium 500 mg twice per day.  12.Potassium 99 mg a day.  13.Plavix 75 mg a day.      [name], N.P.      Brandon Mends, MD  Electronically Signed    BB/MEDQ  D:  07/03/2009  T:  07/03/2009  Job:  [id number]   cc:   Avelino Leeds

## 2011-04-11 NOTE — Discharge Summary (Signed)
NAME:  [name]              ACCOUNT NO.:  [id number]   MEDICAL RECORD NO.:  [id number]          PATIENT TYPE:  INP   LOCATION:  6532                         FACILITY:  [facility]   PHYSICIAN:  Richard A. Alanda Amass, M.D.DATE OF BIRTH:  04/04/1956   DATE OF ADMISSION:  11/05/2007  DATE OF DISCHARGE:  11/08/2007                               DISCHARGE SUMMARY   DISCHARGE DIAGNOSES:  1. Unstable angina, catheterization this admission.  Treated with      angioplasty to instent restenosis of the proximal left anterior      descending stent.  2. Known coronary disease with multiple left anterior descending      interventions, most recently drug-eluting stent placed September      2008.  3. Insulin-dependent diabetes with poor control with hemoglobin A1c      greater than 9.  4. Left ventricular dysfunction with an ejection fraction of less than      25%.  5. History of biventricular automatic implantable cardioverter      defibrillator.  6. Remote cerebrovascular accident.  7. Past history of hepatitis B.  8. History of gastrointestinal bleed in the past.  9. Treated hypertension.  10.Treated dyslipidemia.   HOSPITAL COURSE:  The patient is a 55 year old male well known to Dr.  Alanda Amass.  He has had several interventions in the past.  He had an  anterior MI in 1998.  Recently he was admitted from the office for  recurrent chest pain.  Symptoms were consistent with unstable angina.  The patient was admitted, started on IV heparin, Plavix and nitrates.  Enzymes were negative.  He is set up for diagnostic angiogram which was  done November 07, 2007.  This revealed a 40% RCA, 85% distal PDA  stenosis, 99% OM-3, 70% focal stenosis in the proximal LAD stent and no  restenosis of the mid LAD stent that was placed September 2008.  EF was  less than 25%.  He underwent IVUS to the proximal LAD stent site and  then angioplasty by Dr. Allyson Sabal.  He tolerated the procedure well.  We  feel he can  be discharged November 08, 2007.  We did put him on  antibiotics for bronchitis.  Plavix will be resumed.   DISCHARGE MEDICATIONS:  1. Zithromax 500 mg a day for 2 days.  2. Imdur 60 mg a day.  3. Nadolol 40 mg twice a day.  4. Pravastatin 20 mg a day.  5. Lasix 40 mg a day.  6. Lisinopril 20 mg a day.  7. Prilosec 20 mg twice a day.  8. Plavix 75 mg a day.  9. Aspirin 81 mg a day.  10.Mobic p.r.n.  11.Lantus insulin as directed by his primary care doctor, his most      recent dose was 100 units h.s.  12.Folic acid twice a day.  13.Tricor 145 daily.  14.Elavil 25 mg h.s. p.r.n.  15.Nitroglycerin sublingual p.r.n.   LABORATORY DATA:  White count 5.7, hemoglobin 15.1, hematocrit 134,  sodium 139, potassium 4.2, BUN 16, creatinine 1.26, troponins were  negative x 3, BNP is 118, hemoglobin A1c is  9.4, TSH 1.41, INR is 1.1.   DIAGNOSTICS:  1. Chest x-ray shows no acute disease.  2. EKG shows paced rhythm, bundle branch block.   DISPOSITION:  The patient is discharged in stable condition.   FOLLOW UP:  He will follow up with Dr. Alanda Amass after the first of the  year.      [name], P.A.      Richard A. Alanda Amass, M.D.  Electronically Signed    LKK/MEDQ  D:  11/08/2007  T:  11/08/2007  Job:  [id number]   cc:   Gerlene Burdock A. Alanda Amass, M.D.

## 2011-04-11 NOTE — Cardiovascular Report (Signed)
NAME:  [name], BOCHICCHIO NO.:  [id number]   MEDICAL RECORD NO.:  [id number]          PATIENT TYPE:  INP   LOCATION:  2921                         FACILITY:  [facility]   PHYSICIAN:  Nanetta Batty, M.D.   DATE OF BIRTH:  04-25-1956   DATE OF PROCEDURE:  DATE OF DISCHARGE:                            CARDIAC CATHETERIZATION   Mr. Quiroa is an unfortunate 55 year old mildly overweight single white  male, history of CAD status post multiple coronary interventions in the  past.  He was admitted on January 3 with unstable angina, after being  seen the day before by Dr. Lavonne Chick.  He is two weeks' status post  IVUS-guided proximal LAD PCI and cutting balloon atherectomy by myself.  He has had prior stents placed in his proximal mid-LAD.  He had a mid-  LAD lesion in addition, which was not addressed during his prior  procedure, as well as a segmental proximal PDA lesion with EF of  approximately 20% with anteroapical akinesis.  This is a bi-V ICD.  He  ruled out for myocardial infarction.  He presents now for diagnostic  coronary arteriography and potential multi-vessel PCI and stenting.   DESCRIPTION OF PROCEDURE:  The patient brought to the second floor [facility] cardiac cath lab in a postabsorptive state.  He was pre-medicated  with p.o. Valium, IV Versed and fentanyl in the cath lab.   His right groin was prepped and shaved in  the usual sterile fashion.  One percent Xylocaine was used for local anesthesia.  A 7-French sheath  was inserted to the right femoral artery, using standard Seldinger  technique.  A 6-French right and left Judkins' diagnostic catheters were  used for selective coronary angiography.  Left ventriculography was not  performed.  Visipaque dye was used for the entirety of the case.  Retrograde aortic pressures were monitored during the case.   HEMODYNAMIC RESULTS:  1. Aortic systolic pressure 138, diastolic pressure 81.   SELECTIVE CORONARY  ANGIOGRAPHY:  1. Left main normal.  2. LAD; proximal LAD stented segment looked somewhat hypodense and      mildly narrowed in the 40% to 50% range.  The mid-LAD stented      segment looked widely patent.  The LAD then tapered after the      stented segment to a long area of narrowing.  3. Left circumflex; free of significant disease.  4. Right coronary; large dominant vessel with long 50% to 70% lesion      in the proximal third of the PDA.   DESCRIPTION OF PROCEDURE:  Angiographic ultrasound was performed of the  entire LAD vessel.  Using XB30 7-French guide catheter, along with  014190 Asahi soft wire and a 3.2 [place] Galaxy IVUS catheter,  intravascular ultrasound was performed.  The patient was on aspirin and  Plavix.  He was on Integralin, as well.  ACT was measured at 190 and  additional 3000 units of heparin were administered, with an ending ACT  of 352.  He received an additional 3 mg of p.o. Plavix as well.  Visipaque dye was used entirety  of the case.  Retrograde aortic  pressures were monitored during the case.  The wire was placed in the  distal LAD.  IVUS was performed of the mid-LAD, back to the ostium.  The  luminal area of the mid-LAD measured 2.28 mm squared.  There was  moderate in-stent restenosis within the proximal stented LAD segment,  with significant fibro-intimal hyperplasia and a luminal area that has  narrowest point of  3.23 mm squared.  Following this, a 2.25 x 16 Taxus  drug-eluting stent was then placed in the mid LAD, just mildly  overlapping the distal edge of the mid-LAD stent and deployed a 12  atmospheres resulting in excellent angiographic results..  A 3.0-16  Promus stent was then deployed in the proximal LAD at 14 to 16  atmospheres, resulting in reduction of 50% to 60% lesion and 0%  residual.  Intracoronary coronary nitroglycerin 200 mg was administered,  and completion of angiogram was performed, revealing widely patent  proximal and mid-LAD  with excellent TIMI III flow.   Following this, a JR-4 7-French guide catheter was used to intubate the  right coronary, and the same of 014190 Asahi soft was then placed in the  distal PDA.  A 2 x 2.5 x 20 Taxus drug-eluting stent was then carefully  positioned fluoroscopically and angiographically and deployed at 12  atmospheres (2.43 mm) resulting in reduction of long segmental 60% to  70% lesion to 0% residual, excellent flow.  The patient tolerated the  procedure well.  There was no hemodynamically electrocardiographic  sequelae.   IMPRESSION:  Successful IVUS-guided mid and proximal LAD PCI stenting  and a PCI stenting of the proximal PDA using Taxus and Promus drug-  eluting stents.  The patient tolerated the procedure well.  The  guidewire and catheter were removed.  The sheath was then secured in  place with an ending ACT of approximately 243.  The patient left the lab  in stable condition.  He will be gently hydrated and his renal function  will be assessed.  He will be transferred out to the regular part of the  hospital on telemetry tomorrow and will ambulate and probably will be  hom within the next 48 to 72 hours.  Left lab in stable condition.      Nanetta Batty, M.D.  Electronically Signed     JB/MEDQ  D:  12/03/2007  T:  12/03/2007  Job:  [id number]   cc:   Patient Chart  2nd Floor [facility]  [facility]  Richard A. Alanda Amass, M.D.  Avelino Leeds

## 2011-04-11 NOTE — H&P (Signed)
NAME:  [name], [name]              ACCOUNT NO.:  [id number]   MEDICAL RECORD NO.:  [id number]          PATIENT TYPE:  EMS   LOCATION:  MAJO                         FACILITY:  [facility]   PHYSICIAN:  Richard A. Alanda Amass, M.D.DATE OF BIRTH:  01-Jan-1956   DATE OF ADMISSION:  07/28/2007  DATE OF DISCHARGE:                              HISTORY & PHYSICAL   CHIEF COMPLAINT:  Chest pain.   HISTORY OF PRESENT ILLNESS:  Mr. Hyson is a 55 year old male followed  by Dr. Alanda Amass with a history of coronary disease.  He had a remote  anterior MI treated with PTCA in 1998.  His last catheterization was in  May 2007.  He had patent LAD site and underwent PTCA to a PDA lesion.  He has left ventricular dysfunction with an EF of 25-35% by echo in  January 2006.  He had a BiV ICD implanted in April 2006.  He presents to  emergency room tonight with chest pain.  He said he has actually been  having this for a a couple of days, but tonight it got worse.  He  describes a chest pain that starts in his lower left chest that radiates  up to his mid sternum.  This evening he had some associated diaphoresis.  He did not take nitroglycerin.  He is currently pain free in the  emergency room.   PAST MEDICAL HISTORY:  1. Remarkable for prior CVA without residual.  2. Insulin-dependent diabetes.  3. He has treated hypertension.  4. Dyslipidemia.   CURRENT MEDICATIONS:  1. Lisinopril 20 mg a day.  2. Furosemide 40 mg a day.  3. Prilosec 20 mg b.i.d.  4. Imdur 60 mg a day.  5. Pravastatin 20 mg a day.  6. Plavix 75 mg a day.  7. Nadolol 40 mg b.i.d.  8. Aspirin 81 mg a day.  9. Lantus 70 units a day.  10.Folic acid daily.  11.Fish oil.   ALLERGIES:  MORPHINE, CEPHALOSPORINS, PENICILLIN.   SOCIAL HISTORY:  He is divorced.  He lives with his brother.  He is a  nonsmoker.   FAMILY HISTORY:  Both his brothers have coronary disease.   REVIEW OF SYSTEMS:  Essentially unremarkable except as noted above.   He  does have a history of hepatitis B in the past associated with some GI  bleeding.  He was seen by Dr. Matthias Hughs in 2005.  He has not had recent  melena or hematemesis.   PHYSICAL EXAMINATION:  VITAL SIGNS:  Blood pressure 138/72, pulse 75,  respirations 12.  GENERAL:  Well-developed, morbidly obese male in no acute distress.  HEENT:  Normocephalic.  Extraocular movements intact.  Sclerae  nonicteric.  NECK:  Without JVD and without bruit.  CHEST:  Clear to auscultation and percussion.  CARDIAC:  Regular rate and rhythm without murmur, rub, or gallop.  Normal S1 and S2.  ABDOMEN:  Morbidly obese.  Nontender.  EXTREMITIES:  Without edema.  Distal pulses are 3+/4.  NEUROLOGIC:  Grossly intact.  He is awake, alert, cooperative.  Moves  all extremities without obvious deficit.   LABORATORY DATA:  Sodium 133, potassium 4.2, BUN 17, creatinine 1.  White count 5.7, hemoglobin 15, hematocrit 42.7, platelets 163, troponin  negative.   IMPRESSION:  1. Unstable angina.  2. Known coronary artery disease with prior left anterior descending.  3. Percutaneous transluminal coronary angioplasty in 1998 with      subsequent restenosis, treated with percutaneous transluminal      coronary angioplasty and stenting. This site was patent in May 2007      and at that time he underwent a posterior descending artery      angioplasty.  4. Ischemic cardiomyopathy with ejection fraction of 25-35%, status      post BiV implantable cardioverter defibrillator April 2006.  5. Remote cerebrovascular accident without residual.  6. Insulin-dependent diabetes.  7. Treated hypertension.  8. Treated dyslipidemia.  9. Obesity.   PLAN:  The patient admitted to telemetry to rule out myocardial  infarction.  He was put on intravenous heparin and nitrates.      Abelino Derrick, P.A.      Richard A. Alanda Amass, M.D.  Electronically Signed    LKK/MEDQ  D:  07/29/2007  T:  07/29/2007  Job:  [id number]

## 2011-04-11 NOTE — Discharge Summary (Signed)
[name], [name]              ACCOUNT NO.:  [id number]   MEDICAL RECORD NO.:  [id number]          PATIENT TYPE:  INP   LOCATION:  2024                         FACILITY:  [facility]   PHYSICIAN:  Richard A. Alanda Amass, M.D.DATE OF BIRTH:  [birth year]   DATE OF ADMISSION:  11/30/2007  DATE OF DISCHARGE:  12/05/2007                               DISCHARGE SUMMARY   DISCHARGE DIAGNOSES:  1. Unstable angina, left anterior descending artery and posterior      descending artery.  Intervention with stenting this admission.  2. Known coronary artery disease with recent left anterior descending      artery atherectomy three weeks ago by Dr. Allyson Sabal.  3. Known ischemic cardiomyopathy with an ejection fraction of 20%.  4. History of bivalve automatic implantable cardioverter-defibrillator      (BiV AICD).  5. Non-insulin-dependent diabetes.  6. History of hepatitis C.   HOSPITAL COURSE:  Mr. [name] is a 55 year old male well known to Dr.  Alanda Amass with a long history of coronary disease.  He has had multiple  interventions in the past.  About three weeks ago, he had an LAD cutting  balloon by Dr. Allyson Sabal.  At that time, he had some residual moderate  disease in the PDA and LAD.  He was seen in the office by Dr. Elsie Lincoln on  November 29, 2007.  He had been having recurrent chest pain consistent  with unstable angina.  Dr. Elsie Lincoln suggested that the patient be admitted  on the 2nd for nitroglycerin, heparinization, and cath, but he deferred  and wanted to come in on the 3rd as a same-day admission.  He was seen  by Dr. Allyson Sabal on admission and started on heparin and nitrites.  Enzymes  are negative.   On the 4th, he had some transient hypotension requiring IV fluids.  There were some social issues during this admission regarding the  patient's brother and his power of attorney.  Ultimately, this was  sorted out.  He underwent catheterization on December 03, 2007 and  subsequent Taxus stenting to the mid  LAD and a Promus stent to the  proximal LAD and a Taxus stent to the PDA.  He tolerated this well.   Dr. Allyson Sabal feels he can discharge, and we sent him home on December 05, 2007.  He will follow up with Dr. Alanda Amass.   DISCHARGE MEDICATIONS:  1. Nadolol 40 mg twice daily.  2. Pravastatin 20 mg nightly.  3. Furosemide 40 mg a day.  4. Lisinopril 20 mg a day.  5. Prilosec 20 mg twice daily.  6. Plavix 75 mg a day.  7. Fish oil 1 gm b.i.d.  8. Vitamin B.  9. Aspirin 81 mg a day.  10.Cinnamon 1 gm a day.  11.Mobic 7.5 mg p.r.n.  12.Lantus 100 units daily.  13.Folic acid twice daily.  14.Tricor 80 daily.  15.Amitriptyline nightly p.r.n.  16.Glucophage 500 mg 2 tablets twice daily.  He will resume his      Glucophage on December 06, 2007.  17.Nitroglycerin sublingual p.r.n.   LABS:  White count 5.5, hemoglobin 13.6, hematocrit 39, platelets  142.  Sodium 135, potassium 3.7, BUN 17, creatinine 1.38.  LFTs are normal.  Troponins are negative.  LDL was 77, HDL 28.  Magnesium 2.2.  BNP is 84  at discharge.  TSH is 1.4.   CHEST X-RAY:  Cardiomegaly and pulmonary venous hypertension.   UA was unremarkable.  INR is 1.1.   Telemetry is AV-paced.   DISPOSITION:  Patient is discharged in stable condition and will follow  up with Dr. Alanda Amass in a couple of weeks in the office.  He knows not  to start his Glucophage until tomorrow.      Abelino Derrick, P.A.      Richard A. Alanda Amass, M.D.  Electronically Signed    LKK/MEDQ  D:  12/05/2007  T:  12/05/2007  Job:  [id number]

## 2011-04-11 NOTE — H&P (Signed)
[name], [name] NO.:  [id number]   MEDICAL RECORD NO.:  [id number]          PATIENT TYPE:  INP   LOCATION:  3733                         FACILITY:  [facility]   PHYSICIAN:  Nanetta Batty, M.D.   DATE OF BIRTH:  07-13-1956   DATE OF ADMISSION:  06/30/2009  DATE OF DISCHARGE:                              HISTORY & PHYSICAL   __________  [name] is a 55 year old mildly overweight Caucasian male who I last  saw in the office 2 months ago.  He has history of CAD, status post  Cypher stenting to his RCA on December 26, 2007, with a known chronically  occluded and recanalized mid LAD.  He also had PDA and PLA disease,  which was small and non-recanalizable.  His EF was 45% at the time of  that cath.  His last Myoview performed in 2005, showed scar in the LAD  territory.  His other problems include remote tobacco use abuse, having  stopped 1 year ago, hypertension, hyperlipidemia and insulin-dependent  diabetes mellitus.  I last saw him in May, and stented his proximal and  mid AV groove circumflex with Xience drug-eluting stents.  Over the last  couple days, he has noticed some abdominal pain, nausea, vomiting,  diaphoresis, as well as bilateral arm and chest pain.  He comes in today  as a walk-in to be evaluated.   MEDICATIONS:  1. Aspirin 325.  2. Metformin 1 gram b.i.d.  3. Gabapentin.  4. Amlodipine 5.  5. Glimepiride 4.  6. Plavix 75.  7. Fenofibrate 180.  8. Ranexa 50 b.i.d.  9. Metoprolol 50 b.i.d.  10.Isosorbide 20 t.i.d.   ALLERGIES:  SIMVASTATIN.   REVIEW OF SYSTEMS:  As 12-point review of systems except as already  noted.   PHYSICAL EXAMINATION:  VITAL SIGNS:  Blood pressure is 108/68 with a  pulse of 112.  His weight is down 7 pounds to 157.  GENERAL:  The  patient is alert and oriented, neurologically intact.  LUNGS:  Clear.  Carotids 2+ without bruits.  HEART:  Reveals a regular rate and rhythm, noted to be somewhat  tachycardiac without  murmurs, gallops or rubs.   A 12-lead EKG reveals LVH with repolarization changes and J-point  elevation, unchanged from his prior EKG.   IMPRESSION:  [name] has nausea, vomiting, diaphoresis and chest  pain of unclear etiology.  He has had pancreatitis in the past.  He was  seen at [facility] 2 weeks ago and was diagnosed with irritable  bowel  syndrome.  I am going to admit him, obtain routine lab work, as well as  an abdominal ultrasound to assess his gallbladder.  We will get the  hospitalist to be the attending physicians, we will consult.  He has  also requested Dr. Matthias Hughs as a gastroenterologist.      Nanetta Batty, M.D.  Electronically Signed     JB/MEDQ  D:  06/30/2009  T:  06/30/2009  Job:  [id number]

## 2011-04-11 NOTE — Cardiovascular Report (Signed)
NAME:  [name], [name] NO.:  [id number]   MEDICAL RECORD NO.:  [id number]          PATIENT TYPE:  INP   LOCATION:  6532                         FACILITY:  [facility]   PHYSICIAN:  Nanetta Batty, M.D.   DATE OF BIRTH:  1956/08/23   DATE OF PROCEDURE:  DATE OF DISCHARGE:                            CARDIAC CATHETERIZATION   CUTTING BALLOON ATHERECTOMY   Mr. Bienvenue is a unfortunate 55 year old gentleman, history of ischemic  cardiomyopathy, status post LAD stenting x2.  He has bilateral-V ICD,  insulin-dependent diabetes, hyperlipidemia and history of hepatitis B.  He was admitted by Dr. Alanda Amass on December 9, because of accelerating  angina, after stopping his Plavix as an outpatient.  He ruled out for  myocardial infarction.  He underwent diagnostic coronary arteriography  by Dr. Alanda Amass, revealing question of in-stent restenosis within the  proximal LAD stent, tapered distal LAD and diffuse PDA disease, EF of  about 25%.   PLAN:  Plan now is to perform IVUS-guided proximal LAD PCI.   PROCEDURE DESCRIPTION:  Existing 6-French sheath in the right femoral  artery was exchanged over wire for a 7-French sheath.  Using a 7-French  XB LAD 3.5 guide catheter, along with an 01.4190 Asahi Pro-water wire  and a IVUS catheter, and angiographic ultrasound was performed.  The  patient received 3,000 units of additional heparin with an ACT of  greater than 200.  He received an additional 300 mg of p.o. Plavix.  IVUS of the 60% hyperdense proximal LAD lesion, revealed the luminal  area to be approximately 0.5 mm squared with stent diameter measured  approximately 0.5 x 3.5.  Because of this cutting balloon atherectomy  was performed with a 3.25 x 10 cutter at 9 atmospheres, resulting in  reduction of 60% hypodense lesion to 0% residual.  The patient tolerated  the procedure well.  The guidewire and catheter were removed.  The  sheath was sewn securely in place.  The patient  left the lab in stable  condition.  Plans will be remove the sheath, once the ACT falls below  150.  Integralin will be continued for 19 hours.  The patient will  continue aspirin and Plavix.  He left the lab in stable condition.      Nanetta Batty, M.D.  Electronically Signed     JB/MEDQ  D:  11/07/2007  T:  11/07/2007  Job:  [id number]   cc:   Patient Chart  2nd Floor + [facility]  [facility]  Richard A. Alanda Amass, M.D.

## 2011-04-11 NOTE — Cardiovascular Report (Signed)
NAMEAYDIAN, DIMMICK NO.:  [id number]   MEDICAL RECORD NO.:  [id number]          PATIENT TYPE:  INP   LOCATION:  6532                         FACILITY:  [facility]   PHYSICIAN:  Nanetta Batty, M.D.   DATE OF BIRTH:  1956/09/15   DATE OF PROCEDURE:  05/11/2008  DATE OF DISCHARGE:                            CARDIAC CATHETERIZATION   Mr. Hellinger is a 55 year old white male with history of ischemic  cardiomyopathy with EF of 40%-45% range.  He was last catheterized in  January 2009 and had a Cypher stent to his RCA.  He had 95% LAD,  recanalized which was old.  Hypertension, hyperlipidemia, and diabetes  as well as tobacco abuse.  He developed progressively worse chest pain.  He was admitted with unstable angina.  He was ruled out for myocardial  infarction.  He presents now for diagnostic coronary arteriography to  define his anatomy and to rule out ischemic etiology.   DESCRIPTION OF PROCEDURE:  The patient was brought to the second floor  [facility] in postabsorptive state.  He was  premedicated with p.o. Valium.  His right groin was prepped and shaved  in the usual sterile fashion.  Xylocaine 1% was used for local  anesthesia.  A 6-French sheath was inserted into the right femoral  artery using standard Seldinger technique.  A 6-French right-left  Judkins diagnostic catheter as well as [place] pigtail catheter were used  for selective cholangiography, left ventriculography and subselective  left internal mammary artery angiography.  Visipaque dye was used for  the entirety of the case.  Thoracic aorta, left ventricular, and  pullback pressures were recorded.   HEMODYNAMIC RESULTS:  1. Aortic systolic pressure 173, diastolic pressure 90.  2. Ventricle systolic pressure 177, end-diastolic pressure 18.   SELECTIVE CORONARY ANGIOGRAPHY:  1. Left main normal.  2. LAD; the LAD had 95% recanalized long segmental lesion in the      midportion which was  apparently present during his previous cath      and is old.  3. Left circumflex; free of significant disease.  4. Right coronary; stent in the midportion is widely patent.  He did      have high-grade disease in his mid PDA and ostial PLA.  5. Left ventriculography; RAO left ventriculogram was performed using      25 mL of Visipaque dye at 12 mL per second.  The overall LVEF was      estimated at approximately 40%-45% with severe anteroapical      hypokinesia unchanged from the prior cath.  6. Left internal mammary artery; this vessel is subselectively      visualized and is widely patent.  It is suitable for use during      coronary bypass grafting if needed.   IMPRESSION:  Mr. Batterman stent is widely patent.  He does have  chronically high-grade segmental mid left anterior descending disease  and posterior descending artery and posterolateral artery disease and I  will review his prior angiogram to see if there is any change in his  anatomy, but at this point I  think medical therapy is recommended.   Sheath was removed and pressure was held on the groin to achieve  hemostasis.  The patient left the lab in stable condition.      Nanetta Batty, M.D.  Electronically Signed     JB/MEDQ  D:  05/11/2008  T:  05/12/2008  Job:  [id number]   cc:   [facility]  [facility]

## 2011-04-11 NOTE — Discharge Summary (Signed)
[name], [name]              ACCOUNT NO.:  [id number]   MEDICAL RECORD NO.:  [id number]          PATIENT TYPE:  INP   LOCATION:  2030                         FACILITY:  [facility]   PHYSICIAN:  Raymon Mutton, P.A. DATE OF BIRTH:  March 04, 1956   DATE OF ADMISSION:  07/29/2007  DATE OF DISCHARGE:  08/01/2007                               DISCHARGE SUMMARY   DISCHARGE DIAGNOSES:  1. Unstable angina status post catheterization during this admission      with swelling of the totally occluded left anterior descending,      performed by Dr. Allyson Sabal, drug-eluting stent.  2. A long history of coronary artery disease with previous      percutaneous coronary interventions.  3. Status post biventricular implantable cardioverter defibrillator      secondary to ischemic cardiomyopathy with ejection fraction less      than 30%.  4. Hypertension.  5. Insulin-dependent diabetes mellitus.  6. Hyperlipidemia.  7. Remote cerebrovascular accident.  8. Hepatitis B.  9. History of gastrointestinal bleed in 2005.  10.Obesity.   This is a 55 year old Caucasian gentleman, a patient of Dr. Alanda Amass,  who presented to [facility] with complaints of tightness in the  chest and increasing dyspnea on exertion, as well as diaphoresis.  He  was admitted to the hospital and rule out myocardial infarction.  His  enzymes were negative, but the patient was scheduled for coronary  angiography the following day.   Dr. Allyson Sabal performed coronary angiography, which revealed severely-  occluded proximal portion of the LAD, and he performed intervention  using the drug-eluting stent and reducing the lesion from 100 to 0% and  restoring the TIMI-3 flow.  The patient still has residual disease in  the LAD, 75% distally and 60% proximally to the stent and 40% stenosis  of the proximal RCA, as well as mild 60-70% stenosis in the previous  PTCA site.  The patient tolerated the procedure well.  We kept him in  the  hospital for observation for the next 48 hours.  He refused to  participate in cardiac rehab phase II.  On day August 01, 2007, Dr.  Lynnea Ferrier assessed the patient and deemed him to be stable for discharge  home.   DISCHARGE INSTRUCTIONS:  The patient is to stay on low-fat, low-  cholesterol diet.  Not to drive or lift weights greater than 5 pounds  for 3 days post cath.  He is to report to our office for any problems  with the groin puncture site, such as, swelling, oozing, increased pain,  difficulty with ambulating.   DISCHARGE MEDICATIONS:  1. Plavix 75 mg daily.  2. Aspirin 325 mg daily.  3. Lisinopril 20 mg daily.  4. Furosemide 40 mg daily.  5. Prilosec 20 mg b.i.d.  6. Nitroglycerin 0.4 mg sublingual p.r.n.  7. Pravastatin 20 mg daily.  8. Nadolol 20 mg b.i.d.  9. Lantus 70 units daily.  10.Florical daily.  11.Fish oil 1000 mg b.i.d.   The patient was instructed to discontinue taking Imdur.  He will be seen  by Dr. Alanda Amass in the office on  August 07, 2007, at 10 a.m.      Raymon Mutton, P.A.     MK/MEDQ  D:  08/01/2007  T:  08/01/2007  Job:  [id number]   cc:   Gerlene Burdock A. Alanda Amass, M.D.

## 2011-04-14 NOTE — Op Note (Signed)
NAME:  [name], [name]              ACCOUNT NO.:  [id number]   MEDICAL RECORD NO.:  [id number]          PATIENT TYPE:  INP   LOCATION:  2853                         FACILITY:  [facility]   PHYSICIAN:  Richard A. Alanda Amass, M.D.DATE OF BIRTH:  1956-02-04   DATE OF PROCEDURE:  02/27/2005  DATE OF DISCHARGE:                                 OPERATIVE REPORT   PROCEDURE:  Implantation of permanent DDD/RV biventricular implantable  cardioverter/defibrillator with passive fixation of RV, LV, and active  fixation atrial electrodes, coronary sinus angiogram, hand injection, left  upper extremity venogram, hand injection, VF induction via pacing, R on T  with internal defibrillation.   IMPLANTING PHYSICIANS:  Richard A. Alanda Amass, M.D., Janeece Riggers. Severiano Gilbert, M.D.   COMPLICATIONS:  None.   ESTIMATED BLOOD LOSS:  Approximately 30 cc.   ANESTHESIA:  Local Xylocaine 1%, Valium 5 mg p.o. premedication, vancomycin  1 gm IV, antibiotic prophylaxis, Versed 5 mg IV in divided doses, fentanyl  100 mcg IV in divided doses.   TOTAL CONTRAST:  20 cc.   PREOPERATIVE DIAGNOSES:  1.  Remote anterior wall myocardial infarction, treated with emergency      percutaneous transluminal coronary angioplasty (POBA) on March 23, 1997.      Reperfusion time 12 hours.  2.  Residual left ventricular dysfunction with chronic ejection fraction 25-      30% on optimal medical therapy.  3.  Class III congestive heart failure, congestive and low output symptoms,      symptomatic on optimal medical therapy.  4.  Left bundle branch block.  5.  Septal posterior wall dyssynchrony, moderately severe.  Documented in 2D      echocardiogram on December 08, 2004.  6.  Left anterior descending artery ISR, treated with HSRA and left anterior      descending artery bare-metal stent on June 09, 1997.  7.  No restenosis, left anterior descending artery, on multiple follow-up      studies with noncritical residual coronary disease.  8.   Adult-onset diabetes mellitus, insulin-dependent therapy at present.  9.  Hyperlipidemia therapy.  10. Remote cerebrovascular accident in 1996 with good recovery.  No      sequelae.  Intracerebral abnormality on prior scans, not felt to be      related.  (See remote CT report).  11. Upper gastrointestinal bleed related to small varicosities and gastritis      on June 02, 2004 without recurrence.  12. Past hepatitis B, resolved.  Under the care of Dr. Matthias Hughs.   POSTOPERATIVE DIAGNOSIS:  Same as above.   PROCEDURE:  Patient was brought to the second floor EP lab in the post-  absorptive state after 5 mg of Valium p.o. premedication and 1 gm of Ancef  IV antibiotic prophylaxis.  The left chest was prepped and draped in the  usual manner.  Xylocaine 1% was used for local anesthesia.  A left  infraclavicular transverse incision angled somewhat inferiorly was  performed.  This was brought down to the prepectoral fascia using  electrocautery dissection to control hemostasis.  A pulse generator pocket  was then formed  in the inferomedial direction to accommodate the device in  the prepectoral plane.  A left upper extremity venogram was done to assess  the location of the extrathoracic left subclavian vein.  Under road-mapping  and fluoroscopic control, using an 18 thin-walled needle, the left extra-  thoracic axillary vein/subclavian vein was entered with single anterior  puncture.  Three separate tandem punctures were performed with two #7 French  Safe Sheaths used in the two medial puncture sites, a #9 Database administrator  was utilized in the lateral puncture site for coronary sinus lead placement.  The atrial lead was advanced through the most medial sheath.  The sheath was  pulled back, and the lead was positioned in the right atrial appendage and  active fixation secured under fluoroscopic control.  The sheath was peeled  away.  The RV/ICD lead was then passed through the second 7 [place]  Safe  Sheath and positioned well out into the RV apex with good anatomic position,  and the Safe Sheath removed.  Atrial ventricular and RV threshold testing  was then performed.  The third and most lateral 8 French Safe Sheath was  used for the CS lead.  A Medtronic __________  delivery system with an MB-2  guiding catheter was used through the 8 Database administrator.  The coronary  sinus was intubated under fluoroscopic control.  Hand injections for  coronary sinus angiography were then performed, delineating two good  posterolateral veins.  There was a very small posterolateral vein coming  from the main coronary sinus, but this was felt to be too small for lead  placement.  The guiding catheter was slightly pulled back.  We then were  able to pass the CS lead, Medtronic bipolar lead through the guided catheter  and was using an [employer]ahi 0.014 inch angioplasty guidewire.  The lead was  positioned in the very posterolateral position, confirmed by fluoroscopy in  multiple projections.  LV threshold testing was then performed.  The 8  French Safe Sheath was then pulled away under fluoroscopic control.  The  guiding catheter was pulled back out of the coronary sinus under  fluoroscopic control, and the guiding catheter was then removed, using the  standard sheath-slitting technique.  The lead remained stable.  Repeat  threshold testing of the RV, RA, and LV leads was then performed.  There was  no diaphragmatic stimulation of either electrode with 10 volt output using  the PSA.  Each lead was secured with two interrupted #1 silk sutures that  were in a silicone __________  for each electrode.  The pocket was irrigated  with kanamycin solution, and hemostasis was secured with electrocautery.  The generator was then hooked into proper sequence, identifying each lead.  The single __________  was tightened for each electrode.  The electrodes were looped behind, and the generator was delivered into  the pocket.  It was  then covered with a sterile towel.  DF induction was done with a pacing  train of 400 milliseconds, coupling interval of 188 milliseconds with R on T  shock at 1 joule.  VF was induced and was properly sensed through the ICD,  and the patient was promptly defibrillated with a single internal shock of  20 joules.  Charge time was 3.35 seconds.  High voltage shock impedance was  47 ohm with a B-AX configuration for shock vector.  The patient was  adequately sedated through this and maintained good hemodynamic stability.  The ICD was then  turned off.  Slight electrocautery had to be used for some  superficial bleeders.  The sponge count was correct.  There was good  fluoroscopic position of all leads with no pneumothorax.  The subcutaneous  tissue was closed with two separate running layers of 2-0 Vicryl suture, and  the skin was closed with 4-0 subcuticular Vicryl sutures.  Steri-Strips were  then applied.  The patient was transferred to the holding area for  postoperative programming and was in stable condition.   Atrial electrode, Medtronic steroid eluting 5076-52CM active fixation, screw-  in; ZO:[id number].   RV electrode:  Medtronic [id number]; WJ:[id number] V.   LV-coronary sinus lead; Medtronic 4194-88 cm; AO:[id number] V.   Pulse generator:  InSync Maximo [id number]; ON:[id number].   BIPOLAR THRESHOLDS:  1.  Right atrial threshold:  1 volt at 0.5 milliseconds.  Impedance 696 ohm.      P wave 3.3 mV.  Slew rate 0.8.  V/SEC.  2.  RV threshold bipolar:  0.6 volts at 0.5 milliseconds.  Impedance 764      ohm.  R wave 11.5 mV.  Slew rate 4.  3.  Left ventricle bipolar:  Threshold 1.5 volts at 0.5 milliseconds.      Impedance 1010 ohm.  R 20.6 mV.  Slew rate 4.      RAW/MEDQ  D:  02/27/2005  T:  02/27/2005  Job:  [id number]   cc:   Avelino Leeds  [address]  [place]  [place] [address]  Fax: [phone]   Janeece Riggers. Severiano Gilbert, M.D.  Fax: [phone]   [facility] Lab/Pacing Lab   Dr. Kandis Cocking office

## 2011-04-14 NOTE — Consult Note (Signed)
NAME:  [name], [name] NO.:  [id number]   MEDICAL RECORD NO.:  [id number]                   PATIENT TYPE:  INP   LOCATION:  5533                                 FACILITY:  [facility]   PHYSICIAN:  Bernette Redbird, M.D.                DATE OF BIRTH:  [birth year]   DATE OF CONSULTATION:  05/04/2003  DATE OF DISCHARGE:                                   CONSULTATION   Dr. Lora Havens of the [facility] asked me to see this  55 year old gentleman because of abdominal  pain in the setting of acute  hepatitis.   Mr. Canterbury has multiple medical problems, but had been basically problem-  free from the GI tract standpoint apart from gastroesophageal reflux  disease.   With that background, however, a couple of months ago he began feeling poor  and two weeks ago was hospitalized at [facility] and apparently to  have acute hepatitis B, although at the moment we have only scattered  records.   In any event, he was hospitalized for about four days being released about  10 days ago, during which time a CT scan of the abdomen was performed which  reportedly showed two spots on his pancreas of particular interest given  the fact that his mother recently died of pancreatic cancer.   The patient's chief complaint at this time is diffuse low and mid abdominal  pain across his abdomen more or loss constant in character, not related to  meals or defecation.  I do not believe there is any frank radiation of the  pain.   Concerning the hepatitis, he is apparently positive for hepatitis B although  I do not see confirmation of that on the limited hospital records we have  available at this time.  His transaminases on  May 26 where in the 11 to  1300 range and his bilirubin was 15.5.  When rechecked three days ago at  [facility], they were in the 1300 range with the bilirubin having  risen to 21.6.  The patient's ammonia level was normal  while at the  hospital.   The patient was seen consultatively by Dr. Chales Abrahams, a gastroenterologist in  [place].  Apparently, a CT-directed biopsy of the pancreatic spots was  contemplated but apparently the patient's blood pressure was not  satisfactory for doing the procedure so it was never performed.   The patient, because of his pain, was seen again at [facility]  several evenings ago.  He was treated with pain medication and slept for  several hours and was released.   Because of the absence of a specific diagnosis, the patient was seeking  further care and had himself brought by ambulance from [place] to [facility] last night for further evaluation.   The patient has had a poor appetite in recent months.  Initially, he was  trying to lose weight on the [facility] Diet and lost about 15 pounds but  over the past several weeks he has continued to lose an additional 20 pounds  without trying for a total weight loss of about 35 pounds.   Risk factors for hepatitis B are basically absent.  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  He denies homosexual  activity, IV drug abuse, recent blood transfusions, exposure to blood  products.   In addition to his hepatitis B, the patient has possible risk factors for  fatty infiltration of the liver including hypertriglyceridemia, diabetes of  about eight years duration (poorly controlled with a hemoglobin A1c of 11.5  when checked here at this hospital about a year and a 1/2 ago).  I do not  have baseline liver chemistries on our computer for comparison from that  hospitalization.   PAST MEDICAL HISTORY:  Allergy to PENICILLIN (unknown reaction as a child)  and  MORPHINE (makes me leave this world).   OUTPATIENT MEDICATIONS IMMEDIATELY PRIOR TO ADMISSION:  1. Nexium.  2. Lantus.   Prior to his acute  hepatitis, the patient was on multiple medications  including Avandia (has never been on Resulin) which was switched to Amaryl  and then switched to Lantus when the hepatitis took hold.  He was also on  Lasix, Toprol XL, amitriptyline for restless leg syndrome, Nexium for  gastroesophageal reflux disease, Zocor and also Tricor in the past, Allegra  and at one time Coumadin because of an apparent mural thrombus in his heart  by his report.   OPERATION:  None, other than a percutaneous transluminal coronary  angioplasty approximately seven years ago.   Medical illnesses are numerous and include:  Coronary disease status post  four heart attacks most recently several years ago and also status post  percutaneous transluminal coronary angioplasty as noted above;  cerebrovascular accident affecting his right body in 1995 or 1996, now with  minimal residual and diabetes which was diagnosed about eight years ago at  the time of his cerebrovascular accident.  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  As far as I know, there is no history of hypertension but there is  a history of asthma.   HABITS:  Nonsmoker, nondrinker.   FAMILY HISTORY:  Mother died of pancreatic cancer.  Gallstones in his  sister.  No family history of colon cancer.  Two paternal aunts have  cirrhosis, but I believe both cases were in association with excessive  ethanol consumption.   SOCIAL HISTORY:  The patient lives alone.  He is currently on social  security disability, but is able to work on a very limited part-time basis  as a Marine scientist.   REVIEW OF SYSTEMS:  As noted, there has been significant involuntary weight  loss of probably 20 pounds or so over the past month or so. At baseline, he has a tendency for gastroesophageal reflux disease which is well controlled   by Nexium.  No dysphagia.  No chronic abdominal  pain.  Ordinarily, he has  one regular bowel movement a day but since the hepatitis, and especially  recently he has been having loose stools typically after each meal.  I do  not believe there has been any problem with rectal bleeding or dark stools.  No nausea or vomiting.  He does have restless legs and has felt somewhat  weak recently.  There has also been pruritus associated with the jaundice.   PHYSICAL EXAMINATION:  GENERAL:  This is a jaundiced, but otherwise healthy-  appearing Caucasian male in no evident distress at the time of my  evaluation.  He had some Dilaudid earlier.  He is without frank pallor.  I  did not note overt stigmata of chronic liver disease.  CHEST:  Has a few inspiratory wheezes at both bases. No rales.  HEART:  Normal without gallops, rubs, murmurs, clicks or arrhythmias.  ABDOMEN:  Some left upper quadrant tenderness without rigidity or rebound  and no real guarding.  Liver span by scratch test is actually normal,  probably 8 or 9 cm (although ultrasound shows liver to be enlarged). There  is no right upper quadrant tenderness of any significance at this time.  Overall, this is a fairly benign abdomen.  No obvious ascites although there  is some flank dullness present.  EXTREMITIES:  Have perhaps just a hint of edema.  RECTAL:  By Dr. Beverely Pace in the emergency room  was not repeated and showed  light brown stool.  NEUROLOGIC:  The patient is completely alert, appropriate and coherent at  this time without any obvious encephalopathy.  He seems quite oriented and  appropriate.  He has no obvious focal motor or cranial nerve deficits.   LABORATORY DATA:  White count 6000 with 77 polys, 14 lymphs, hemoglobin 15.1  with an MCV of 88.  Platelets 218,000.  INR is 1.3.  Chemistry panel shows a  potassium of 3.2, BUN 11, creatinine 1.3, glucose 302, total bilirubin in  our lab is 34, up substantially from several days  ago at [facility].  Alk phos 162, AST 1198, ALT 1089, albumin 2.0, lipase normal at 44, CK  normal.   Ultrasound:  Hepatomegaly with roughly 15-cm liver, gallbladder sludge with  a thin wall and no biliary ductal dilatation, small amount of perihepatic  ascites.   IMPRESSION:  1. Nonspecific rather diffuse persistent low to mid abdominal pain.  2. Jaundice and elevated transaminases, apparent acute hepatitis reportedly     due to hepatitis B although we do not have a copy of his hepatitis B     serologies to confirm that diagnosis at the moment.  3. Family history of pancreatic cancer with questionable spots  on the     pancreas on recent CT scan at [facility].  4. Diarrhea and pruritus, possibly related to jaundice and hepatic     dysfunction.  5. Multiple significant medical problems including coronary disease status     post  stenting and apparent previous myocardial infarctions and possible    mural thrombus.  History of asthma.  History of diabetes, restless leg     syndrome.  6. Hypokalemia, mild.  7. Significant weight loss over the past month or so (20 pounds).   DISCUSSION:  The whole picture is somewhat confusing.  First of all, he has  no obvious reason for hepatitis B although given the elevation of his  transaminases and the persistence of their elevation over the past two weeks  or so one would have to assume that this is viral hepatitis or less likely a  sustained drug reaction.  Shock liver, which could cause this degree of  enzyme abnormality, would  not give such persistent abnormalities.   This unfortunate patient has had a large number of medical problems to  content with and as mentioned above is at risk for fatty liver in view of  his history of diabetes and hyperlipidemia.  The degree  to which that  explained in his current hepatitic dysfunction is unknown but probably it is  not a significant factor.   The patient does not have evidence of  cirrhosis in terms of  thrombocytopenia.  The low albumin is probably primarily due to acute  hepatic dysfunction and or poor oral intake with weight loss recently.   I suspect the pain is probably due to referred pain from hepatic capsular  stretching or less likely from pancreatic neoplasia.   PLAN:  1. CT scan of abdomen and pelvis to evaluate pain and the history of     spots on the pancreas.  2. CA-19-9 level as further evidence for or against the presence of     pancreatic cancer.  3. Symptomatic management of abdominal  pain.  4. Atarax for pruritus.  5. Potassium supplementation as you are doing to correct the hypokalemia.  6. For now, I am going to give some additional hydration to the patient     since he has not been eating or drinking much today and since he is     diabetic to reduce risk of nephrotoxicity from the dye during his     abdominal CT scan.  After that, it would probably be best to try to     restrict fluid somewhat.  7. Dr. Julio Sicks of the [facility] hospitalist has kindly consented to admit     this patient with multiple medical problems to the hospitalist service     and I will continue to follow him consultatively while he is in     inpatient.  Thereafter, I would encourage to return to his primary     gastroenterologist in [place].                                                 Bernette Redbird, M.D.    RB/MEDQ  D:  05/04/2003  T:  05/04/2003  Job:  [id number]   cc:   Avelino Leeds  [address]  [place]  [place] [address]  Fax: [phone]   Richard A. Alanda Amass, M.D.  [phone], [address]  [place]  [place] [address]  Fax: [phone]

## 2011-07-17 ENCOUNTER — Inpatient Hospital Stay (HOSPITAL_COMMUNITY): Payer: Medicare Other

## 2011-07-17 ENCOUNTER — Inpatient Hospital Stay (HOSPITAL_COMMUNITY)
Admission: AD | Admit: 2011-07-17 | Discharge: 2011-07-19 | DRG: 617 | Disposition: A | Discharge status: Skilled Nursing Facility | Payer: Medicare Other | Source: Ambulatory Visit | Attending: Family Medicine | Admitting: Family Medicine

## 2011-07-17 DIAGNOSIS — Z8673 Personal history of transient ischemic attack (TIA), and cerebral infarction without residual deficits: Secondary | ICD-10-CM

## 2011-07-17 DIAGNOSIS — E1142 Type 2 diabetes mellitus with diabetic polyneuropathy: Secondary | ICD-10-CM | POA: Diagnosis present

## 2011-07-17 DIAGNOSIS — M9080 Osteopathy in diseases classified elsewhere, unspecified site: Secondary | ICD-10-CM | POA: Diagnosis present

## 2011-07-17 DIAGNOSIS — S98139A Complete traumatic amputation of one unspecified lesser toe, initial encounter: Secondary | ICD-10-CM

## 2011-07-17 DIAGNOSIS — Z794 Long term (current) use of insulin: Secondary | ICD-10-CM

## 2011-07-17 DIAGNOSIS — M86679 Other chronic osteomyelitis, unspecified ankle and foot: Secondary | ICD-10-CM | POA: Diagnosis present

## 2011-07-17 DIAGNOSIS — Z9861 Coronary angioplasty status: Secondary | ICD-10-CM

## 2011-07-17 DIAGNOSIS — E1149 Type 2 diabetes mellitus with other diabetic neurological complication: Secondary | ICD-10-CM | POA: Diagnosis present

## 2011-07-17 DIAGNOSIS — I5022 Chronic systolic (congestive) heart failure: Secondary | ICD-10-CM | POA: Diagnosis present

## 2011-07-17 DIAGNOSIS — I2510 Atherosclerotic heart disease of native coronary artery without angina pectoris: Secondary | ICD-10-CM | POA: Diagnosis present

## 2011-07-17 DIAGNOSIS — Z7982 Long term (current) use of aspirin: Secondary | ICD-10-CM

## 2011-07-17 DIAGNOSIS — E1169 Type 2 diabetes mellitus with other specified complication: Principal | ICD-10-CM | POA: Diagnosis present

## 2011-07-17 DIAGNOSIS — N184 Chronic kidney disease, stage 4 (severe): Secondary | ICD-10-CM | POA: Diagnosis present

## 2011-07-17 DIAGNOSIS — I2589 Other forms of chronic ischemic heart disease: Secondary | ICD-10-CM | POA: Diagnosis present

## 2011-07-17 DIAGNOSIS — K219 Gastro-esophageal reflux disease without esophagitis: Secondary | ICD-10-CM | POA: Diagnosis present

## 2011-07-17 DIAGNOSIS — I252 Old myocardial infarction: Secondary | ICD-10-CM

## 2011-07-17 DIAGNOSIS — E785 Hyperlipidemia, unspecified: Secondary | ICD-10-CM | POA: Diagnosis present

## 2011-07-17 DIAGNOSIS — I129 Hypertensive chronic kidney disease with stage 1 through stage 4 chronic kidney disease, or unspecified chronic kidney disease: Secondary | ICD-10-CM | POA: Diagnosis present

## 2011-07-17 DIAGNOSIS — I509 Heart failure, unspecified: Secondary | ICD-10-CM | POA: Diagnosis present

## 2011-07-17 DIAGNOSIS — Z95810 Presence of automatic (implantable) cardiac defibrillator: Secondary | ICD-10-CM

## 2011-07-17 LAB — COMPREHENSIVE METABOLIC PANEL
Albumin: 3.8 g/dL (ref 3.5–5.2)
BUN: 28 mg/dL — ABNORMAL HIGH (ref 6–23)
Calcium: 9.8 mg/dL (ref 8.4–10.5)
Chloride: 97 mEq/L (ref 96–112)
Creatinine, Ser: 1.86 mg/dL — ABNORMAL HIGH (ref 0.50–1.35)
Total Bilirubin: 0.6 mg/dL (ref 0.3–1.2)
Total Protein: 7.5 g/dL (ref 6.0–8.3)

## 2011-07-17 LAB — APTT: aPTT: 31 seconds (ref 24–37)

## 2011-07-17 LAB — PROTIME-INR
INR: 1.1 (ref 0.00–1.49)
Prothrombin Time: 14.4 seconds (ref 11.6–15.2)

## 2011-07-17 LAB — GLUCOSE, CAPILLARY: Glucose-Capillary: 68 mg/dL — ABNORMAL LOW (ref 70–99)

## 2011-07-17 LAB — MAGNESIUM: Magnesium: 2.2 mg/dL (ref 1.5–2.5)

## 2011-07-17 LAB — SEDIMENTATION RATE: Sed Rate: 25 mm/hr — ABNORMAL HIGH (ref 0–16)

## 2011-07-17 LAB — MRSA PCR SCREENING: MRSA by PCR: NEGATIVE

## 2011-07-18 ENCOUNTER — Inpatient Hospital Stay (HOSPITAL_COMMUNITY): Payer: Medicare Other

## 2011-07-18 ENCOUNTER — Other Ambulatory Visit: Payer: Self-pay

## 2011-07-18 LAB — DIFFERENTIAL
Basophils Absolute: 0 10*3/uL (ref 0.0–0.1)
Eosinophils Relative: 6 % — ABNORMAL HIGH (ref 0–5)
Lymphocytes Relative: 19 % (ref 12–46)
Lymphs Abs: 0.6 10*3/uL — ABNORMAL LOW (ref 0.7–4.0)
Monocytes Absolute: 0.4 10*3/uL (ref 0.1–1.0)
Monocytes Relative: 12 % (ref 3–12)

## 2011-07-18 LAB — CBC
HCT: 32.3 % — ABNORMAL LOW (ref 39.0–52.0)
MCH: 28.6 pg (ref 26.0–34.0)
MCHC: 34.1 g/dL (ref 30.0–36.0)
MCV: 84.1 fL (ref 78.0–100.0)
RDW: 14 % (ref 11.5–15.5)

## 2011-07-18 LAB — BASIC METABOLIC PANEL
BUN: 29 mg/dL — ABNORMAL HIGH (ref 6–23)
Calcium: 9.7 mg/dL (ref 8.4–10.5)
Creatinine, Ser: 2.07 mg/dL — ABNORMAL HIGH (ref 0.50–1.35)
GFR calc Af Amer: 40 mL/min — ABNORMAL LOW (ref >60)
GFR calc non Af Amer: 33 mL/min — ABNORMAL LOW (ref >60)
Glucose, Bld: 121 mg/dL — ABNORMAL HIGH (ref 70–99)

## 2011-07-18 LAB — C-REACTIVE PROTEIN: CRP: 0.7 mg/dL — ABNORMAL HIGH (ref <0.6)

## 2011-07-18 LAB — GLUCOSE, CAPILLARY: Glucose-Capillary: 214 mg/dL — ABNORMAL HIGH (ref 70–99)

## 2011-07-19 LAB — CBC
Hemoglobin: 11 g/dL — ABNORMAL LOW (ref 13.0–17.0)
MCH: 28.9 pg (ref 26.0–34.0)
Platelets: 137 10*3/uL — ABNORMAL LOW (ref 150–400)
RBC: 3.8 MIL/uL — ABNORMAL LOW (ref 4.22–5.81)
WBC: 4.8 10*3/uL (ref 4.0–10.5)

## 2011-07-19 LAB — DIFFERENTIAL
Basophils Relative: 0 % (ref 0–1)
Eosinophils Absolute: 0.2 10*3/uL (ref 0.0–0.7)
Lymphs Abs: 0.5 10*3/uL — ABNORMAL LOW (ref 0.7–4.0)
Monocytes Relative: 12 % (ref 3–12)
Neutro Abs: 3.5 10*3/uL (ref 1.7–7.7)
Neutrophils Relative %: 74 % (ref 43–77)

## 2011-07-19 LAB — GLUCOSE, CAPILLARY: Glucose-Capillary: 210 mg/dL — ABNORMAL HIGH (ref 70–99)

## 2011-07-19 LAB — BASIC METABOLIC PANEL
CO2: 30 mEq/L (ref 19–32)
Calcium: 9.2 mg/dL (ref 8.4–10.5)
Chloride: 98 mEq/L (ref 96–112)
Glucose, Bld: 156 mg/dL — ABNORMAL HIGH (ref 70–99)
Sodium: 136 mEq/L (ref 135–145)

## 2011-07-19 NOTE — Op Note (Signed)
NAMEDELFORD, WINGERT NO.:  [id number]  MEDICAL RECORD NO.:  [id number]  LOCATION:  5029                         FACILITY:  [facility]  PHYSICIAN:  Alvan Dame, D.P.M. DATE OF BIRTH:  [birth year]  DATE OF PROCEDURE:  07/18/2011 DATE OF DISCHARGE:                              OPERATIVE REPORT   SURGEON:  Alvan Dame, DPM  PREOPERATIVE DIAGNOSES:  Suspect chronic osteomyelitis, soft tissue mass or osteolysis, second metatarsophalangeal joint of the left foot, cannot rule out abscess or cellulitis of that toe.  POSTOPERATIVE DIAGNOSES:  Suspect chronic osteomyelitis, soft tissue mass or osteolysis, second metatarsophalangeal joint of the left foot, cannot rule out abscess or cellulitis of that toe.  OPERATIVE PROCEDURE:  Amputation of second toe and second metatarsal head left foot with intraoperative cultures to be taken.  INDICATIONS FOR SURGERY:  The patient has had a several-month history of increasing swelling and discomfort, and enlargement of second toe.  X- rays have confirmed radical osteolysis, degeneration of the MTP joint with complete dislocation and destruction of the metatarsal head.  Based on clinical history and radiographic findings, again there is history possibly the dog bite to that toe some months ago.  At this time, the patient has been intermittently on p.o. antibiotics and per the patient request, my recommendation and surgery proceed as scheduled for appropriate amputation.  ANESTHESIA:  Managed anesthesia care with IV sedation and a regional block or local block total of 10 mL, 50:50 mixture of 2% Xylocaine plain, 0.5% Marcaine plain in a Mayo like fashion.  HEMOSTASIS:  Left ankle tourniquet 250 mmHg.  FINDINGS AND PROCEDURES:  The patient was brought into the OR and placed on the table in a supine position.  IV sedation was established.  Local block was administered.  The foot was prepped with a Betadine prep and appropriate  scrub was performed and at this time, the following procedure was carried out, amputation of second toe and MTP joint to the left foot.  Attention was directed to the dorsum of the left foot overlying the second toe, which was grossly enlarged and deviated.  At this time, a tennis racket-type incision was made circumferentially around the base of the digit and extending along the dorsal surface of the metatarsal shaft.  The incision was deepened via blunt and sharp dissection with hemostasis being acquired as necessary.  Vessels were encountered were Bovied and ligated and at this time, there was good amount of fatty tissue underlying this, overlying the capsule of the second MTP joint.  The large amount of fatty and no necrotic tissue, no purulence, no discharge, no drainage, no odor was encountered during this entire area.  The capsule of the second MTP was left intact and not violated during this amputation.  At this time, the capsule was dissected free from the surrounding subcu tissue and plantar fat pad and the dorsal incision over the capsule of the second metatarsal neck was carried out in a linear fashion.  The capsular tissue reflected medially and laterally and a periosteum window was opened utilizing power instrumentation.  A through-and-through osteotomy was carried out from dorsal, distal to plantar proximal orientation.  A slight oblique cut was  made in the proximal to the metatarsal neck.  At this time, the entirety second MTP joint capsule and second digit was excised from the site in toto, submitted for both pathology and cultures.  At this time, deep culture, swabs of the wound were also carried out for both aerobic and anaerobic cultures.  Again, the tissues were appeared to somewhat yellow fatty in nature.  No purulent discharge, no malodor, no severe necrotic tissue was identified anywhere in the site.  Deeply, this encapsulated within the joint itself, which was not  violated during the procedure.  At this time, the site was cleansed thoroughly with a pulse lavage utilizing gentamicin solution and pulse lavage was carried out. At least a liter or two of solution was utilized.  We thoroughly lavaged the area.  Any loose, necrotic tissue and fibrous tissue was resected from the site.  All vessels and extended tendons were debrided back as far back in the site if possible.  Vascular structures were bovied and at this time, the deep capsular closure was carried out with closure of subcu tissue and the deep capsule over the metatarsal neck utilizing 3-0 Vicryl and then closure of the skin was accomplished utilizing 3-0 nylon in a continuous running interlocking fashion and simple interrupted fashion in a T-fashion.  Closure was accomplished.  A 0.25-inch Penrose drain was placed to help of void the hematoma and minimal void space was encountered once resection of the digit was performed.  Betadine and a dry sterile compressive dressing was applied over the right foot closing over the Penrose drain.  Once the dressing was left in place, ankle tourniquet was deflated.  After initial deflation, it was noted there was a consider amount of bleed noted from the site.  Ankle tourniquet was reinflated and a redressing was applied.  The Penrose drain was repositioned and dry sterile compressive dressing was reapplied to the right forefoot with a Coban as well as an Ace wrap being applied. Tourniquet was again deflated after about 2 minutes of redressing time and it was noted that minimal bleed was noted following that redressing. At this time, the patient was returned from the OR to recovery in satisfactory condition.  Discharge per anesthesia to the fifth floor in Service of Trial Hospitalist team 6, Dr. Janee Morn and the patient will be followed appropriately for at least 24-hour.  The plan is to initiate new IV antibiotic starting postoperatively and possibly send  the patient to his Rehab Postop Facility and IV antibiotics are appropriate, p.o. antibiotics based on culture findings.          ______________________________ Alvan Dame, D.P.M.     RS/MEDQ  D:  07/18/2011  T:  07/19/2011  Job:  [id number]  Electronically Signed by Alvan Dame D.P.M. on 07/19/2011 09:10:46 AM

## 2011-07-20 NOTE — Discharge Summary (Signed)
NAMEABDUR, HOGLUND NO.:  [id number]  MEDICAL RECORD NO.:  [id number]  LOCATION:  5029                         FACILITY:  [facility]  PHYSICIAN:  Pleas Koch, MD        DATE OF BIRTH:  09-03-1956  DATE OF ADMISSION:  07/17/2011 DATE OF DISCHARGE:                              DISCHARGE SUMMARY   DISCHARGE DIAGNOSES: 1. Chronic left foot ulcer, now status post amputation. 2. Extensive coronary artery disease, history of biventricular ICD,     and recent PCI. 3. Diabetes mellitus type 2, A1c pending. 4. Hyperlipidemia. 5. Hypertension. 6. Gastroesophageal reflux disease. 7. History of hepatitis C. 8. History of paroxysmal atrial fibrillation. 9. History of cerebrovascular accident in 1996.  DISCHARGE MEDICATIONS: 1. Magnesium oxide 400 b.i.d. 2. Amiodarone 200 mg 1 tablet b.i.d. 3. Gabapentin 300 one cap t.i.d. 4. Carvedilol 6.25 one and a half tablets b.i.d. with meals. 5. Metoprolol 50 mg 1 tablet b.i.d. 6. Hydralazine 25 mg 1 tablet t.i.d. 7. TriCor 145 mg at bedtime. 8. Doxycycline discontinued. 9. Pravachol 20 mg 1 tablet at bedtime. 10.Lantus 45 units q.a.m. 11.Novolin N 116 units per specialized sliding scale. 12.Lasix 1 tablet b.i.d. 40 mg. 13.Imdur 60 mg one half tablet at bedtime. 14.Nitroglycerin SL 0.4 q.5 p.r.n. below tongue. 15.Vicodin 5/500 one tablet b.i.d. p.r.n. 16.Aspirin 325 mg. 17.Aspirin 81 mg daily. 18.Ticagrelor 90 mg 1 tablet b.i.d. 19.Prilosec 40 mg 1 tablet b.i.d. 20.Cinnamon 1000 mg b.i.d. 21.Potassium chloride 1 tablet b.i.d.  I have discontinued Bactrim. 22.Metolazone 5 mg 1 tablet daily. 23.Vitamin B12 1000 International units 1 tablet daily. 24.Vitamin D5 50 over-the-counter 1 tablet daily. 25.Vitamin D3 1000 units 1 tablet q.a.m.  PERTINENT CONSULTS:  Abelino Derrick, PA, of [employer] for Sherry Ruffing who knows him well, as well as Alvan Dame, DPM, the patient's actual podiatrist.  PROCEDURES DONE:   Amputation of second toe and second metatarsal head. Left foot with intraoperative cultures taken, done on July 18, 2011.  BRIEF HISTORY AND PHYSICAL:  The patient is a 55 year old with multiple medical illnesses who presented as a direct admission for amputation of left second toe secondary to chronic osteomyelitis.  He had foot swelling and pain through since March 2012, and was on Bactrim, subsequently placed on doxycycline, and had an endorsement of chills, but no fever.  He had no nausea, no vomiting, no chest pain, and no other issues.  He was otherwise in stable condition.  The patient has an extensive medical history.  Please see dictation [phone].  HOSPITAL COURSE: 1. The patient was admitted and underwent cardiology clearance for     amputation of the foot which ultimately was done on July 19, 2011.  I personally spoke with Dr. Ralene Cork who mentioned that when     he did the raquette-shaped incision, he did not penetrate the     capsule of the second metatarsophalangeal joint and it was left     intact.  As such, the patient will not need IV or oral antibiotics     as the source of the sepsis has gone.  He has had an ESR and CRP     prior to  surgery, although it would be reasonable to follow up     anaerobic as well as wound cultures.  The source of the infection     once again has gone and the patient will not need antibiotics. 2. He was transiently kept on levofloxacin and Flagyl. 3. Extensive coronary artery disease.  The patient was asymptomatic     and Cardiology followed him while in the hospital. 4. Type 2 diabetes mellitus.  The patient was on half of Lantus prior     to surgery and we will resume the regular dose. 5. Hyperlipidemia.  This is stable during hospital. 6. Chronic kidney disease stage IV.  This was stable during     hospitalization as well. 7. Hypertension.  The patient continue his chronic medications. 8. The patient will be seen in Dr. Dionne Bucy  office tomorrow morning     for drain removal, and at the request of the patient who is     insisting on being discharged today, we have attempted to     coordinate his discharge to the facility.  I have explained to him     the risks and benefits of the same, and I have also explained to     him the rationale behind no antibiotics.  The patient understands     this fully.  I spent less than 30 minutes in discharge process.  It     was a pleasure taking care of the patient.  PHYSICAL EXAMINATION:  VITAL SIGNS:  Temperature 98.4, pulse 76, blood pressure 121-107/66-65, O2 sat 97% on room air. HEENT:  Eyes, extraocular movements intact.  No pallor.  No icterus. NECK:  Soft.  There was no JVD.  No bruit. RESPIRATORY:  CTAB. CARDIOVASCULAR:  S1 and S2.  No murmurs, rubs, or gallops.  He was in sinus rhythm. ABDOMEN:  Soft, nontender, and nondistended. EXTREMITIES:  The left lower extremity was wrapped with an Ace bandage and was examined by a podiatrist earlier today.          ______________________________ Pleas Koch, MD     JS/MEDQ  D:  07/19/2011  T:  07/19/2011  Job:  [id number]  cc:   Alvan Dame, D.P.M. Nanetta Batty, M.D. Thurmon Fair, MD  Electronically Signed by Pleas Koch MD on 07/20/2011 12:34:03 PM

## 2011-07-21 LAB — TISSUE CULTURE
Culture: NO GROWTH
Gram Stain: NONE SEEN

## 2011-07-21 LAB — WOUND CULTURE

## 2011-07-23 LAB — ANAEROBIC CULTURE: Gram Stain: NONE SEEN

## 2011-08-03 NOTE — H&P (Signed)
NAMEMarland Kitchen  MAKAVELI, HOARD NO.:  [id number]  MEDICAL RECORD NO.:  [id number]  LOCATION:  5029                         FACILITY:  [facility]  PHYSICIAN:  Ramiro Harvest, MD    DATE OF BIRTH:  1956-03-22  DATE OF ADMISSION:  07/17/2011 DATE OF DISCHARGE:                             HISTORY & PHYSICAL   PRIMARY CARE PHYSICIAN:  Dr. Ardelle Park, MD, [facility], [place], [place].  PATIENT'S PODIATRIST:  Alvan Dame, DPM  PATIENT'S CARDIOLOGIST:  Nanetta Batty, MD, [facility].  PATIENT'S ELECTROPHYSIOLOGIST:  Dr. Royann Shivers, [facility].  CHIEF COMPLAINT:  Admission for amputation for chronic osteomyelitis.  HISTORY OF PRESENT ILLNESS:  [name] is a pleasant 55 year old Caucasian gentleman with history of multiple medical problems include an extensive cardiac history with recent ICD discharge secondary to V-tach and AFib, secondary to ischemic episode, status post PCTI to the LAD in March 2012, history of coronary artery disease status post multiple interventions in the past, history of insulin-dependent diabetes mellitus, history of chronic kidney disease stage 3, history of hypertension, status post Medtronic biventricular ICD, who presents as a direct admission for amputation of the left second toe secondary to chronic osteomyelitis per his podiatrist.  The patient states that he has had left foot swelling, erythema, and pain since March 2012, has been on Bactrim from March 2012 through June 30, 2011 and subsequently placed on doxycycline from June 30, 2011 up until present.  The patient does endorse some chills 1 week prior to admission.  Denies any fever. No nausea.  No vomiting.  No chest pain.  No abdominal pain.  No diarrhea.  No dysuria.  No focal neurological symptoms.  The patient does endorse some constipation and some fatigue as well.  The patient is otherwise in stable  condition.  ALLERGIES:  PENICILLIN causes rash and nausea.  MORPHINE causes nausea and a significant drop in blood pressure per patient.  CEPHALOSPORINS causes tongue swelling and probable angioedema.  PAST MEDICAL HISTORY: 1. History of ICD, discharged secondary to ventricular tachycardia and     AFib secondary to ischemic episode on February 14, 2011. 2. Ischemic cardiomyopathy with prior EF of 15% but last echo done on     February 15, 2011 with an EF of 35-40%. 3. History of unstable angina with negative MI but a new coronary     disease undergoing, a percutaneous coronary intervention of     occluded LAD from a very proximal segment in the region of the very     proximal septal perforating artery with a cutting balloon     arthrotomy in March 2012. 4. Prior coronary artery disease with multiple past percutaneous     coronary interventions and remote large anterior wall MI.  Last     intervention in November 2011. 5. Type 2 diabetes, insulin-dependent with neuropathy. 6. Chronic kidney disease stage III. 7. Hypertension. 8. History of dyslipidemia. 9. History of Medtronic biventricular implantable cardioverter     defibrillator. 10.History of systolic heart failure. 11.History of remote stroke in 1997. 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 13.History of asthma. 14.History  of GI bleed in the past in 2005. 15.History of portal hypertension with small esophageal varices. 16.Gastroesophageal reflux disease. 17.History of acute hepatitis B infection with recovery. 18.History of AVM. 19.History of esophageal erosions and duodenal ulcers per EGD. 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 21.History of MRSA of the left upper extremity in 2005 per patient. 22.Questionable history of  any gout or arthritis.  HOME MEDICATIONS: 1. Vitamin B50 one tablet daily. 2. Doxycycline 100 mg p.o. daily, started on June 30, 2011. 3. Metoprolol 50 mg p.o. b.i.d. 4. Metolazone 5 mg p.o. daily. 5. Magnesium oxide 400 mg p.o. b.i.d. 6. Vitamin D3 1000 units p.o. q.a.m. 7. Vitamin B12 one tablet p.o. daily. 8. TriCor 145 mg p.o. nightly. 9. ________ 90 mg p.o. b.i.d. 10.Prilosec 40 mg p.o. b.i.d. 11.Pravachol 20 mg p.o. nightly. 12.Potassium chloride 20 mEq p.o. b.i.d. 13.Novolin-N sliding scale insulin. 14.Lantus 45 units q.a.m. 15.Nitroglycerin sublingual 0.4 mg q.5 minutes x3 p.r.n. 16.Isosorbide mononitrate 60 mg 1-1/2 tablet p.o. nightly. 17.Hydralazine 25 mg p.o. t.i.d. 18.Gabapentin 300 mg p.o. t.i.d. 19.Lasix 40 mg p.o. b.i.d. 20.Cinnamon 1000 mg p.o. b.i.d. 21.Coreg 6.25 mg 1-1/2 tablets p.o. b.i.d. 22.Aspirin 325 mg p.o. daily. 23.Amiodarone 200 mg p.o. b.i.d.  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  The patient is divorced, however, ex- wife is in the room with the patient and aiding with his history.  No alcohol use.  No tobacco use.  No IV drug use. FAMILY HISTORY:  Father deceased at age 31 from an acute MI.  Mother deceased at age 23s from a pancreatic cancer.  Has a brother who is alive, who had an MI at age 51 and is status post CABG x6.  REVIEW OF SYSTEMS:  As per HPI, otherwise negative.  PHYSICAL EXAMINATION:  VITAL SIGNS:  Temperature 97.6, pulse of 70, blood pressure 116/67, respirations 20, and satting 100% on room air. GENERAL:  The patient is a well-developed, well-nourished gentleman in no acute cardiopulmonary distress. HEENT:  Normocephalic and atraumatic.  Pupils equal, round, and reactive to light and accommodation.  Extraocular movements intact.  Oropharynx is clear.  No lesions.  No exudates. NECK:  Supple.  No lymphadenopathy. RESPIRATORY:  Lungs are clear to auscultation  bilaterally.  No wheezes, crackles, or rhonchi. CARDIOVASCULAR:  Regular rhythm.  No murmurs, rubs, or gallops. ABDOMEN:  Soft, nontender, nondistended.  Positive bowel sounds. EXTREMITIES:  No clubbing, cyanosis, or edema.  Bilateral lower extremity with changes of chronic venous stasis changes, left second great toe is swollen with no erythema, no warmth, and no drainage and nontender to palpation. NEUROLOGIC:  The patient is alert and oriented x3.  Cranial nerves II- XII are grossly intact with no focal deficits.  ASSESSMENT AND PLAN:  Mr. [name] is a 55 year old gentleman history of insulin-dependent diabetes, history of extensive coronary artery disease, admitted as a direct admit for chronic diabetic left foot osteomyelitis for probable amputation. 1. Chronic diabetic left foot osteomyelitis.  We will admit the     patient to regular floor.  Check CBC with diff.  Check CMET.  Check     coags.  Check magnesium.  Check UA, cultures, and sensitivities.     We will hold off  on any antibiotics at this time until amputation     has been done and cultures and biopsies were done after which we     will place the patient on IV Vancomycin, Levaquin, and Flagyl.  We     will consult Cardiology for preop clearance.  Per his podiatrist     request, took an EKG and we will follow. 2. Extensive coronary artery disease, status post biventricular ICD     and recurrent PCI.  The patient is currently asymptomatic.     However, has multiple complex cardiac issues.  We will resume his     home regimen and consult with Cardiology to manage his cardiac     issues and to follow while the patient is in this hospital. 3. Type 2 diabetes, hemoglobin A1c.  Check a CBG a.c. and h.s.  Place     on half home dose Lantus and anticipation of n.p.o. after midnight     and surgery tomorrow.  Place on sliding scale insulin. 4. Hyperlipidemia.  Check a fasting lipid panel.  Continue home dose      Pravachol. 5. Chronic kidney disease stage V, check a CMET and follow, stable. 6. Hypertension.  Continue home meds. 7. Gastroesophageal reflux disease, proton pump inhibitor. 8. Prophylaxis, Protonix for GI prophylaxis.  Lovenox for DVT     prophylaxis.  It has been a pleasure taking care of Mr. [name].     Ramiro Harvest, MD     DT/MEDQ  D:  07/17/2011  T:  07/17/2011  Job:  [id number]  cc:   Nanetta Batty, M.D. Alvan Dame, D.P.M. Dr. Tilden Dome, MD  Electronically Signed by Ramiro Harvest MD on 08/03/2011 12:23:13 PM

## 2011-08-16 LAB — COMPREHENSIVE METABOLIC PANEL
AST: 22
AST: 28
Albumin: 3.2 — ABNORMAL LOW
Albumin: 3.3 — ABNORMAL LOW
Alkaline Phosphatase: 46
BUN: 13
BUN: 15
BUN: 21
CO2: 26
Calcium: 8.9
Chloride: 104
Chloride: 97
Creatinine, Ser: 1.3
Creatinine, Ser: 1.34
GFR calc Af Amer: >60
GFR calc Af Amer: >60
GFR calc non Af Amer: 58 — ABNORMAL LOW
Glucose, Bld: 490 — ABNORMAL HIGH
Potassium: 4.1
Total Bilirubin: 0.9
Total Bilirubin: 1.3 — ABNORMAL HIGH
Total Protein: 6
Total Protein: 6.2

## 2011-08-16 LAB — CBC
HCT: 38.6 — ABNORMAL LOW
HCT: 39.6
HCT: 42.8
Hemoglobin: 13.9
Hemoglobin: 14.2
MCHC: 36
MCV: 86.7
MCV: 87.7
MCV: 88.1
Platelets: 141 — ABNORMAL LOW
Platelets: 142 — ABNORMAL LOW
Platelets: 159
Platelets: 175
RBC: 4.42
RBC: 4.54
RBC: 4.58
RBC: 4.93
RDW: 13.5
RDW: 13.6
WBC: 5.4
WBC: 5.4
WBC: 5.5
WBC: 5.5
WBC: 5.7

## 2011-08-16 LAB — TROPONIN I: Troponin I: 0.04

## 2011-08-16 LAB — LIPID PANEL
Cholesterol: 139
HDL: 28 — ABNORMAL LOW
Total CHOL/HDL Ratio: 5
VLDL: 34

## 2011-08-16 LAB — BASIC METABOLIC PANEL
Calcium: 8.3 — ABNORMAL LOW
Calcium: 9
Chloride: 101
Creatinine, Ser: 1.38
GFR calc Af Amer: >60
GFR calc Af Amer: >60
GFR calc Af Amer: >60
GFR calc non Af Amer: 53 — ABNORMAL LOW
GFR calc non Af Amer: >60
Glucose, Bld: 194 — ABNORMAL HIGH
Glucose, Bld: 291 — ABNORMAL HIGH
Potassium: 3.3 — ABNORMAL LOW
Potassium: 3.7
Potassium: 3.7
Sodium: 131 — ABNORMAL LOW
Sodium: 135
Sodium: 139

## 2011-08-16 LAB — HEPARIN LEVEL (UNFRACTIONATED)
Heparin Unfractionated: 0.35
Heparin Unfractionated: 0.35

## 2011-08-16 LAB — URINALYSIS, ROUTINE W REFLEX MICROSCOPIC
Bilirubin Urine: NEGATIVE
Glucose, UA: >1000 — AB
Ketones, ur: NEGATIVE
Leukocytes, UA: NEGATIVE
Specific Gravity, Urine: 1.026
pH: 6.5

## 2011-08-16 LAB — PROTIME-INR
INR: 1
INR: 1.1
Prothrombin Time: 14

## 2011-08-16 LAB — POCT CARDIAC MARKERS
CKMB, poc: 2.4
Operator id: 285491
Troponin i, poc: <0.05

## 2011-08-16 LAB — DIFFERENTIAL
Basophils Absolute: 0
Lymphocytes Relative: 25
Neutro Abs: 3.2
Neutrophils Relative %: 59

## 2011-08-16 LAB — B-NATRIURETIC PEPTIDE (CONVERTED LAB)
Pro B Natriuretic peptide (BNP): 122 — ABNORMAL HIGH
Pro B Natriuretic peptide (BNP): 129 — ABNORMAL HIGH
Pro B Natriuretic peptide (BNP): 84

## 2011-08-16 LAB — CARDIAC PANEL(CRET KIN+CKTOT+MB+TROPI)
CK, MB: 2.6
CK, MB: 2.6
CK, MB: 3.2
CK, MB: 3.4
Relative Index: INVALID
Relative Index: INVALID
Relative Index: INVALID
Relative Index: INVALID
Total CK: 122
Total CK: 58
Total CK: 61
Total CK: 78
Troponin I: 0.03
Troponin I: 0.03
Troponin I: 0.04
Troponin I: 0.04
Troponin I: 0.05

## 2011-08-16 LAB — URINE MICROSCOPIC-ADD ON

## 2011-08-16 LAB — CK TOTAL AND CKMB (NOT AT ARMC): CK, MB: 3.8

## 2011-08-16 LAB — HEMOGLOBIN A1C
Hgb A1c MFr Bld: 9.2 — ABNORMAL HIGH
Mean Plasma Glucose: 250

## 2011-08-16 LAB — APTT: aPTT: 80 — ABNORMAL HIGH

## 2011-08-27 NOTE — Consult Note (Signed)
NAMEMarland Kitchen  Brandon, [name] NO.:  [id number]  MEDICAL RECORD NO.:  [id number]  LOCATION:  5029                         FACILITY:  [facility]  PHYSICIAN:  Nanetta Batty, M.D.   DATE OF BIRTH:  1956/10/25  DATE OF CONSULTATION: DATE OF DISCHARGE:                                CONSULTATION   HISTORY OF PRESENT ILLNESS:  [name] is a 55 year old male well- known to our group.  He sees Dr. Allyson Sabal and Dr. Royann Shivers in our office. He has a history of coronary disease and ischemic cardiomyopathy.  He had an anterior MI in 1998.  He had an LAD and PDA intervention in 2009. He had in-stent restenosis with a staged intervention to the sites in November 2011.  His last intervention was March 2012, when he presented with V-tach.  He was found to have an occluded LAD, the PDA was patent. He underwent intervention with cutting balloon and angioplasty at multiple sites in the LAD.  Ultimately, Dr. Tresa Endo achieved a good result.  He does have an ischemic cardiomyopathy and had a Medtronic defibrillator placed in 2006 and this was later upgraded to a Bi-V ICD at [facility] in November 2011.  His last ejection fraction had improved to 35% to 40%, this was by echocardiogram in March 2012.  He has had some problems with defibrillator discharging in the past, some of this was felt to be secondary to atrial fibrillation.  He has chronic liver disease, but ultimately was decided to proceed with amiodarone therapy and he is tolerating this well.  He is admitted now for elective toe amputation for chronic osteomyelitis.  We are asked to see him for preoperative clearance.  Since his last intervention, he has not had unusual chest pain or shortness of breath.  He just saw Dr. Royann Shivers in the office August 30, 2011, see his office note for complete details. Basically, this was an ICD check.  Dr. Royann Shivers did stop his Coreg and put him on metoprolol as the patient was having some orthostatics  type symptoms.  PAST MEDICAL HISTORY:  Otherwise remarkable for type 2 insulin-dependent diabetes, peripheral neuropathy, stage III chronic renal insufficiency, treated hypertension, hepatitis C, prior CVA in 1996, prior left fourth toe amputation in November 2010.  HOME MEDICATIONS: 1. Nitroglycerin p.r.n. 2. Omeprazole 40 mg b.i.d. 3. Lasix 40 mg b.i.d. 4. Metolazone 10 mg alternating with 5 mg every other day. 5. Tradjenta 5 mg daily. 6. Lantus 45 units q.a.m. 7. Novolin sliding scale. 8. Apresoline 25 mg t.i.d. 9. Metoprolol 50 mg b.i.d. 10.Imdur 90 mg daily. 11.Potassium 20 mEq b.i.d. 12.Magnesium oxide 400 mg b.i.d. 13.Neurontin 300 mg b.i.d. with 150 mg at bedtime.  He also takes, 1. Brilinta 90 mg b.i.d. 2. Amiodarone 200 mg b.i.d. 3. Aspirin 325 mg a day. 4. Pravastatin 20 mg a day. 5. TriCor 145 mg a day. 6. Doxycycline was started 100 mg a day on August 3.  ALLERGIES:  Include PENICILLIN, MORPHINE intolerance, and CEPHALOSPORINS.  SOCIAL HISTORY:  He is a nonsmoker.  He is divorced, he lives with his brother in [place].  He works in [employer].  FAMILY HISTORY:  Remarkable for his father dying in  his 19s of an MI, he has one brother that had an MI in his 16s.  REVIEW OF SYSTEMS:  Essentially unremarkable except for above.  PHYSICAL EXAMINATION:  VITAL SIGNS:  Blood pressure 116/67, pulse 70, temperature 97.6. GENERAL:  He is a well-developed, well-nourished male in no acute distress. HEENT:  Normocephalic.  Extraocular movements are intact.  Sclerae are anicteric. NECK:  Without obvious JVD or bruit. CHEST:  Clear lung fields bilaterally. CARDIAC:  Reveals regular rate and rhythm with soft systolic murmur at the aortic valve area and left sternal border. ABDOMEN:  Obese, nontender. EXTREMITIES:  No significant edema, he has multiple excoriations secondary to his dog scratching him.  He has had a prior left fourth toe amputation and his left  second toe is swollen and discolored. NEUROLOGIC:  Grossly intact. SKIN:  Cool and dry.  LABORATORY DATA:  Labs are pending.  IMPRESSION: 1. Preoperative clearance. 2. Known coronary artery disease, last intervention was March 2012 to     in-stent restenosis in LAD. 3. Ischemic cardiomyopathy with an EF of 35% to 40% by echocardiogram     in March 2012. 4. History of severe ischemic cardiomyopathy, ICD implant in 2006,     upgraded to a Bi-V ICD in January 2011 with an improvement in     ejection fraction of 35% to 40%. 5. 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 6. Stage III chronic renal insufficiency. 7. Treated hypertension. 8. History of hepatitis C. 9. History of paroxysmal atrial fibrillation in the past. 10.History of cerebrovascular accident in 1996. 11.Prior left fourth toe amputation, now admitted for left second toe     chronic osteomyelitis.  PLAN:  I discussed Mr. Baumler' situation with Dr. Allyson Sabal on the phone, Dr. Allyson Sabal knows him well.  He feels that he is at relatively low risk for toe amputation from a cardiac standpoint.  We would suggest continuing the Brilinta.  We will cut his aspirin back to 81 mg a day. We will follow him in the perioperative period.     [name], P.A.   ______________________________ Nanetta Batty, M.D.    [name]  D:  07/17/2011  T:  07/17/2011  Job:  [id number]  cc:   Nanetta Batty, M.D.  Electronically Signed by Corine Shelter P.A. on 07/24/2011 09:37:28 AM Electronically Signed by Nanetta Batty M.D. on 08/27/2011 11:40:38 AM

## 2011-09-04 LAB — COMPREHENSIVE METABOLIC PANEL
ALT: 26
BUN: 13
Calcium: 9.3
Glucose, Bld: 283 — ABNORMAL HIGH
Sodium: 135
Total Protein: 7.2

## 2011-09-04 LAB — BASIC METABOLIC PANEL
BUN: 16
BUN: 17
CO2: 25
CO2: 28
CO2: 32
Calcium: 8.6
Calcium: 8.7
Chloride: 101
Chloride: 105
Chloride: 98
Creatinine, Ser: 1.25
Creatinine, Ser: 1.26
Creatinine, Ser: 1.29
GFR calc Af Amer: >60
GFR calc Af Amer: >60
GFR calc Af Amer: >60
GFR calc non Af Amer: >60
GFR calc non Af Amer: >60
Glucose, Bld: 175 — ABNORMAL HIGH
Glucose, Bld: 93
Potassium: 3.3 — ABNORMAL LOW
Potassium: 4.2
Sodium: 135
Sodium: 139
Sodium: 139

## 2011-09-04 LAB — CBC
HCT: 43.3
Hemoglobin: 15.5
Hemoglobin: 16.1
MCHC: 34.6
MCHC: 35.7
MCV: 86.1
Platelets: 134 — ABNORMAL LOW
Platelets: 156
Platelets: 187
RBC: 4.8
RBC: 4.86
RBC: 5.07
RDW: 14.2
WBC: 4.2
WBC: 4.7
WBC: 5.7

## 2011-09-04 LAB — B-NATRIURETIC PEPTIDE (CONVERTED LAB)
Pro B Natriuretic peptide (BNP): 118 — ABNORMAL HIGH
Pro B Natriuretic peptide (BNP): 426 — ABNORMAL HIGH

## 2011-09-04 LAB — HEMOGLOBIN A1C
Hgb A1c MFr Bld: 9.4 — ABNORMAL HIGH
Mean Plasma Glucose: 257

## 2011-09-04 LAB — CARDIAC PANEL(CRET KIN+CKTOT+MB+TROPI)
CK, MB: 3.2
CK, MB: 5.7 — ABNORMAL HIGH
Relative Index: INVALID
Relative Index: INVALID
Relative Index: INVALID
Total CK: 75
Total CK: 78
Troponin I: 0.03
Troponin I: 0.03
Troponin I: 0.03
Troponin I: 0.05

## 2011-09-04 LAB — HEPARIN LEVEL (UNFRACTIONATED)
Heparin Unfractionated: 0.58
Heparin Unfractionated: 0.61
Heparin Unfractionated: 0.74 — ABNORMAL HIGH

## 2011-09-04 LAB — PROTIME-INR
INR: 1
INR: 1.1
Prothrombin Time: 13.8

## 2011-09-04 LAB — LIPID PANEL
Cholesterol: 126
HDL: 30 — ABNORMAL LOW
LDL Cholesterol: 69

## 2011-09-08 LAB — CBC
HCT: 39.9
HCT: 40.3
HCT: 42.7
HCT: 43.3
Hemoglobin: 14.2
Hemoglobin: 15.1
Hemoglobin: 15.1
MCHC: 35.3
MCHC: 35.4
MCV: 86.2
MCV: 86.4
Platelets: 139 — ABNORMAL LOW
Platelets: 144 — ABNORMAL LOW
RBC: 4.61
RBC: 4.65
RBC: 4.92
RDW: 13.4
RDW: 13.6
WBC: 5.8
WBC: 6

## 2011-09-08 LAB — URINALYSIS, ROUTINE W REFLEX MICROSCOPIC
Glucose, UA: >1000 — AB
Ketones, ur: NEGATIVE
Leukocytes, UA: NEGATIVE
Nitrite: NEGATIVE
Protein, ur: NEGATIVE

## 2011-09-08 LAB — PROTIME-INR
INR: 1
INR: 1.1
Prothrombin Time: 13.8
Prothrombin Time: 14.2

## 2011-09-08 LAB — LIPID PANEL
HDL: 28 — ABNORMAL LOW
Triglycerides: 185 — ABNORMAL HIGH
VLDL: 37

## 2011-09-08 LAB — I-STAT 8, (EC8 V) (CONVERTED LAB)
Chloride: 99
Glucose, Bld: 387 — ABNORMAL HIGH
Potassium: 4.2
pH, Ven: 7.39 — ABNORMAL HIGH

## 2011-09-08 LAB — DIFFERENTIAL
Basophils Absolute: 0
Eosinophils Relative: 8 — ABNORMAL HIGH
Lymphocytes Relative: 29
Lymphs Abs: 1.5
Neutrophils Relative %: 54

## 2011-09-08 LAB — COMPREHENSIVE METABOLIC PANEL
BUN: 11
BUN: 16
CO2: 27
CO2: 30
Calcium: 9.1
Chloride: 104
Chloride: 97
Creatinine, Ser: 1.06
Creatinine, Ser: 1.08
GFR calc non Af Amer: >60
GFR calc non Af Amer: >60
Glucose, Bld: 315 — ABNORMAL HIGH
Total Bilirubin: 0.8
Total Bilirubin: 1.1

## 2011-09-08 LAB — BASIC METABOLIC PANEL
BUN: 11
BUN: 16
CO2: 30
Calcium: 8.9
Chloride: 103
Creatinine, Ser: 0.99
GFR calc non Af Amer: >60
Glucose, Bld: 254 — ABNORMAL HIGH
Potassium: 4.3

## 2011-09-08 LAB — POCT I-STAT CREATININE
Creatinine, Ser: 1
Operator id: 277751

## 2011-09-08 LAB — URINE MICROSCOPIC-ADD ON

## 2011-09-08 LAB — CARDIAC PANEL(CRET KIN+CKTOT+MB+TROPI)
CK, MB: 3.4
CK, MB: 3.9
Relative Index: INVALID
Total CK: 52
Total CK: 60
Troponin I: 0.01

## 2011-09-08 LAB — HEPARIN LEVEL (UNFRACTIONATED)
Heparin Unfractionated: 0.51
Heparin Unfractionated: 0.68

## 2011-09-08 LAB — CK TOTAL AND CKMB (NOT AT ARMC)
CK, MB: 5.2 — ABNORMAL HIGH
Total CK: 97

## 2011-09-08 LAB — POCT CARDIAC MARKERS
CKMB, poc: 2.1
Troponin i, poc: <0.05

## 2011-09-08 LAB — TSH: TSH: 2.281

## 2011-09-08 LAB — APTT: aPTT: 30

## 2011-09-08 LAB — TROPONIN I: Troponin I: 0.02

## 2011-09-30 ENCOUNTER — Inpatient Hospital Stay (HOSPITAL_COMMUNITY): Payer: Medicare Other

## 2011-09-30 ENCOUNTER — Inpatient Hospital Stay (HOSPITAL_COMMUNITY)
Admission: AD | Admit: 2011-09-30 | Discharge: 2011-09-30 | DRG: 310 | Discharge status: Against Medical Advice | Payer: Medicare Other | Source: Other Acute Inpatient Hospital | Attending: Internal Medicine | Admitting: Internal Medicine

## 2011-09-30 DIAGNOSIS — I4729 Other ventricular tachycardia: Principal | ICD-10-CM | POA: Diagnosis present

## 2011-09-30 DIAGNOSIS — I472 Ventricular tachycardia, unspecified: Principal | ICD-10-CM | POA: Diagnosis present

## 2011-09-30 DIAGNOSIS — Z95810 Presence of automatic (implantable) cardiac defibrillator: Secondary | ICD-10-CM

## 2011-09-30 LAB — GLUCOSE, CAPILLARY

## 2011-09-30 LAB — CBC
HCT: 33.2 % — ABNORMAL LOW (ref 39.0–52.0)
Hemoglobin: 11.3 g/dL — ABNORMAL LOW (ref 13.0–17.0)
MCH: 29.8 pg (ref 26.0–34.0)
MCHC: 34 g/dL (ref 30.0–36.0)
MCV: 87.6 fL (ref 78.0–100.0)
Platelets: 109 K/uL — ABNORMAL LOW (ref 150–400)
RBC: 3.79 MIL/uL — ABNORMAL LOW (ref 4.22–5.81)
RDW: 13.8 % (ref 11.5–15.5)
WBC: 2.7 K/uL — ABNORMAL LOW (ref 4.0–10.5)

## 2011-09-30 LAB — BASIC METABOLIC PANEL WITH GFR
BUN: 31 mg/dL — ABNORMAL HIGH (ref 6–23)
CO2: 26 meq/L (ref 19–32)
Calcium: 9.4 mg/dL (ref 8.4–10.5)
Chloride: 101 meq/L (ref 96–112)
Creatinine, Ser: 2.19 mg/dL — ABNORMAL HIGH (ref 0.50–1.35)
GFR calc Af Amer: 37 mL/min — ABNORMAL LOW
GFR calc non Af Amer: 32 mL/min — ABNORMAL LOW
Glucose, Bld: 138 mg/dL — ABNORMAL HIGH (ref 70–99)
Potassium: 3.3 meq/L — ABNORMAL LOW (ref 3.5–5.1)
Sodium: 140 meq/L (ref 135–145)

## 2011-09-30 LAB — CARDIAC PANEL(CRET KIN+CKTOT+MB+TROPI)
CK, MB: 5.7 ng/mL — ABNORMAL HIGH (ref 0.3–4.0)
Relative Index: 3.1 — ABNORMAL HIGH (ref 0.0–2.5)
Total CK: 181 U/L (ref 7–232)
Troponin I: <0.3 ng/mL

## 2011-09-30 LAB — MAGNESIUM: Magnesium: 1.9 mg/dL (ref 1.5–2.5)

## 2011-09-30 MED ORDER — FENOFIBRATE 160 MG PO TABS
160.0000 mg | ORAL_TABLET | Freq: Every day | ORAL | Status: DC
  Filled 2011-09-30 (×2): qty 1

## 2011-09-30 MED ORDER — HYDRALAZINE HCL 25 MG PO TABS
25.0000 mg | ORAL_TABLET | Freq: Three times a day (TID) | ORAL | Status: DC
  Filled 2011-09-30 (×5): qty 1

## 2011-09-30 MED ORDER — DOXYCYCLINE HYCLATE 100 MG PO TABS
100.0000 mg | ORAL_TABLET | Freq: Two times a day (BID) | ORAL | Status: DC
  Filled 2011-09-30 (×3): qty 1

## 2011-09-30 MED ORDER — VITAMIN D3 25 MCG (1000 UNIT) PO TABS
1000.0000 [IU] | ORAL_TABLET | Freq: Every day | ORAL | Status: DC
  Filled 2011-09-30: qty 1

## 2011-09-30 MED ORDER — POTASSIUM CHLORIDE CRYS ER 20 MEQ PO TBCR: 20.0000 meq | EXTENDED_RELEASE_TABLET | Freq: Two times a day (BID) | ORAL | Status: DC

## 2011-09-30 MED ORDER — PRAVASTATIN SODIUM 40 MG PO TABS
20.0000 mg | ORAL_TABLET | Freq: Every day | ORAL | Status: DC
  Filled 2011-09-30 (×2): qty 0.5

## 2011-09-30 MED ORDER — ASPIRIN EC 325 MG PO TBEC: 325.0000 mg | DELAYED_RELEASE_TABLET | Freq: Every day | ORAL | Status: DC

## 2011-09-30 MED ORDER — GABAPENTIN 600 MG PO TABS: 300.0000 mg | ORAL_TABLET | Freq: Three times a day (TID) | ORAL | Status: DC

## 2011-09-30 MED ORDER — ISOSORBIDE MONONITRATE ER 60 MG PO TB24
60.0000 mg | ORAL_TABLET | Freq: Every day | ORAL | Status: DC
  Filled 2011-09-30: qty 1

## 2011-09-30 MED ORDER — ACETAMINOPHEN 325 MG PO TABS: 650.0000 mg | ORAL_TABLET | ORAL | Status: DC | PRN

## 2011-09-30 MED ORDER — AMIODARONE HCL 200 MG PO TABS
200.0000 mg | ORAL_TABLET | Freq: Two times a day (BID) | ORAL | Status: DC
  Filled 2011-09-30 (×3): qty 1

## 2011-09-30 MED ORDER — ALUM & MAG HYDROXIDE-SIMETH 200-200-20 MG/5ML PO SUSP: 15.0000 mL | ORAL | Status: DC | PRN

## 2011-09-30 MED ORDER — GABAPENTIN 300 MG PO CAPS
300.0000 mg | ORAL_CAPSULE | Freq: Three times a day (TID) | ORAL | Status: DC
  Filled 2011-09-30 (×4): qty 1

## 2011-09-30 MED ORDER — PANTOPRAZOLE SODIUM 40 MG PO TBEC: 40.0000 mg | DELAYED_RELEASE_TABLET | Freq: Two times a day (BID) | ORAL | Status: DC

## 2011-09-30 MED ORDER — FUROSEMIDE 40 MG PO TABS
40.0000 mg | ORAL_TABLET | Freq: Two times a day (BID) | ORAL | Status: DC
  Filled 2011-09-30 (×4): qty 1

## 2011-09-30 MED ORDER — VITAMIN B-12 1000 MCG PO TABS
1000.0000 ug | ORAL_TABLET | Freq: Every day | ORAL | Status: DC
  Filled 2011-09-30: qty 1

## 2011-09-30 MED ORDER — TICAGRELOR 90 MG PO TABS
90.0000 mg | ORAL_TABLET | Freq: Two times a day (BID) | ORAL | Status: DC
  Filled 2011-09-30 (×3): qty 1

## 2011-09-30 MED ORDER — ASPIRIN EC 81 MG PO TBEC
81.0000 mg | DELAYED_RELEASE_TABLET | Freq: Every day | ORAL | Status: DC
  Filled 2011-09-30: qty 1

## 2011-09-30 MED ORDER — NITROGLYCERIN 0.4 MG SL SUBL
0.4000 mg | SUBLINGUAL_TABLET | SUBLINGUAL | Status: DC | PRN
Start: 2011-09-30 — End: 2011-09-30

## 2011-09-30 MED ORDER — METOLAZONE 5 MG PO TABS
5.0000 mg | ORAL_TABLET | ORAL | Status: DC
  Filled 2011-09-30 (×2): qty 1

## 2011-09-30 MED ORDER — ZOLPIDEM TARTRATE 5 MG PO TABS: 5.0000 mg | ORAL_TABLET | Freq: Every evening | ORAL | Status: DC | PRN

## 2011-09-30 MED ORDER — CLINDAMYCIN HCL 300 MG PO CAPS
300.0000 mg | ORAL_CAPSULE | Freq: Three times a day (TID) | ORAL | Status: DC
  Filled 2011-09-30 (×4): qty 1

## 2011-09-30 MED ORDER — INSULIN ASPART 100 UNIT/ML ~~LOC~~ SOLN: 0.0000 [IU] | Freq: Three times a day (TID) | SUBCUTANEOUS | Status: DC

## 2011-09-30 MED ORDER — CARVEDILOL 3.125 MG PO TABS
9.3750 mg | ORAL_TABLET | Freq: Two times a day (BID) | ORAL | Status: DC
  Filled 2011-09-30 (×4): qty 3

## 2011-09-30 MED ORDER — INSULIN GLARGINE 100 UNIT/ML ~~LOC~~ SOLN: 45.0000 [IU] | SUBCUTANEOUS | Status: DC

## 2011-09-30 MED ORDER — HYDROCODONE-ACETAMINOPHEN 5-325 MG PO TABS: 1.0000 | ORAL_TABLET | Freq: Two times a day (BID) | ORAL | Status: DC

## 2011-10-04 NOTE — Discharge Summary (Signed)
NAMEDUSTON, SMOLENSKI NO.:  [id number]  MEDICAL RECORD NO.:  [id number]  LOCATION:  4704                         FACILITY:  [facility]  PHYSICIAN:  Dr. Zoila Shutter      DATE OF BIRTH:  [birth year]  DATE OF ADMISSION:  09/30/2011 DATE OF DISCHARGE:  09/30/2011                              DISCHARGE SUMMARY   DISCHARGE DIAGNOSES: 1. Multiple implantable cardioverter-defibrillator shocks.  Three     according to the patient. 2. Ischemic cardiomyopathy with ejection fraction 35-40%. 3. Hypokalemia. 4. Diabetes mellitus. 5. Hyperlipidemia. 6. Hypertension. 7. History of gastroesophageal reflux disease. 8. History of paroxysmal atrial fibrillation. 9. History of hepatitis C. 10.Cerebrovascular accident in 1996. 11.Coronary artery disease with recent percutaneous coronary     intervention in March 2012.  HOSPITAL COURSE:  [name] is a 55 year old obese Caucasian male with a history of ischemic cardiomyopathy.  He has had numerous appropriate defibrillator discharges in the past for ventricular tachycardia and ventricular fibrillation.  His history also includes diabetes mellitus, peripheral neuropathy, stage III chronic renal insufficiency, hypertension, hepatitis C, cerebrovascular accident in 1996, left 4th toe amputation in November 2010.  He presented originally to [facility] after his ICD discharged.  The patient stated that he had become weak all of the sudden and then felt like he was going to pass out, then within seconds after that felt his ICD shock him.  Reported that he had noticed a 10-pound weight gain in the last 10 days.  The patient was admitted to telemetry unit.  Cycles cardiac enzymes.  Also check a PA and lateral x-ray of the chest and BNP.  He is hypokalemic, so we replete his potassium.  I was called of Medtronic patent interrogate his ICD device.  Following day his admission, the patient stated that he really wanted to go home.   Medtronic had been called to interrogate his device and just after that device was interrogated, the patient left the hospital.  One rounding on the patient earlier that day the patient had gotten very tearful.  He did not disclose what the issue well as but stated that he really needed to leave.  He called his family approximately 3 o'clock in the afternoon and they the arrived approximately 4 o'clock and took him home.  DISCHARGE LABS:  WBC 2.7, hemoglobin 11.3, hematocrit 33.2, platelets 109, sodium 140, potassium 3.3, chloride 101, carbon dioxide side 26, glucose 138, BUN 31, creatinine 2.19, calcium 9.4, magnesium 9.1, creatine kinase total 181, CK-MB is 5.7, relative index of 3.1. Troponin less than 0.30 and a BNP of 503.4.  STUDIES/PROCEDURES: 1. Chest x-ray showed cardiomegaly without CHF or pneumonia. 2. Interrogation of Medtronic devices showed 1 ventricular     fibrillation episode treated with 30 joule shock, rate was 273/200     beats per minute with successful.  DISCHARGE MEDICATIONS: 1. Lantus 45 units every morning. 2. Carvedilol 6.25 mg 1.5 caps twice daily. 3. Clindamycin 300 mg 2 times a day. 4. Doxycycline 100 mg twice daily. 5. Cinnamon 1000 mg over-the-counter twice daily. 6. Ticagrelor 90 mg 1 tab twice daily. 7. Vitamin D3 1000 units 1 tab every morning. 8. Vitamin D 50 over-the-counter  1 tab daily. 9. Vitamin B12 1000 international units over-the-counter daily. 10.Vicodin 5/500, 1 tab twice daily. 11.TriCor 145 mg daily. 12.Prilosec 40 mg 1 tab twice daily. 13.Pravachol 20 mg 1 tab daily at bedtime. 14.Potassium chloride 20 mEq 1 tab twice daily. 15.Novolin 1-16 units daily. 16.Nitroglycerin sublingual 0.4 mg 1 tab every 5 minutes as needed, up     to 3 doses for chest pain. 17.Imdur 60 mg 1/2 tablets daily at bedtime. 18.Metolazone 5 mg 1 tab every morning. 19.Hydralazine 25 mg 1 tab 2 times a day. 20.Gabapentin 300 mg 1 capsule 3 times a  day. 21.Furosemide 40 mg 1 tab twice daily. 22.Aspirin 81 mg 1 tab daily. 23.Amiodarone 200 mg 1 tab twice daily.  DISPOSITION:  [name] left against medical advice.  We will have the office call him and set up with outpatient follow up.    ______________________________ Wilburt Finlay, PA   ______________________________ Dr. Zoila Shutter    BH/MEDQ  D:  10/04/2011  T:  10/04/2011  Job:  [id number]  cc:   [facility].

## 2011-11-09 IMAGING — CR DG CHEST 2V
2 series · 2 of 2 positions shown · non-contrast
Comparison: 10/10/2010

CLINICAL DATA: ICD firing

CHEST - 2 VIEW

[w chest pa]
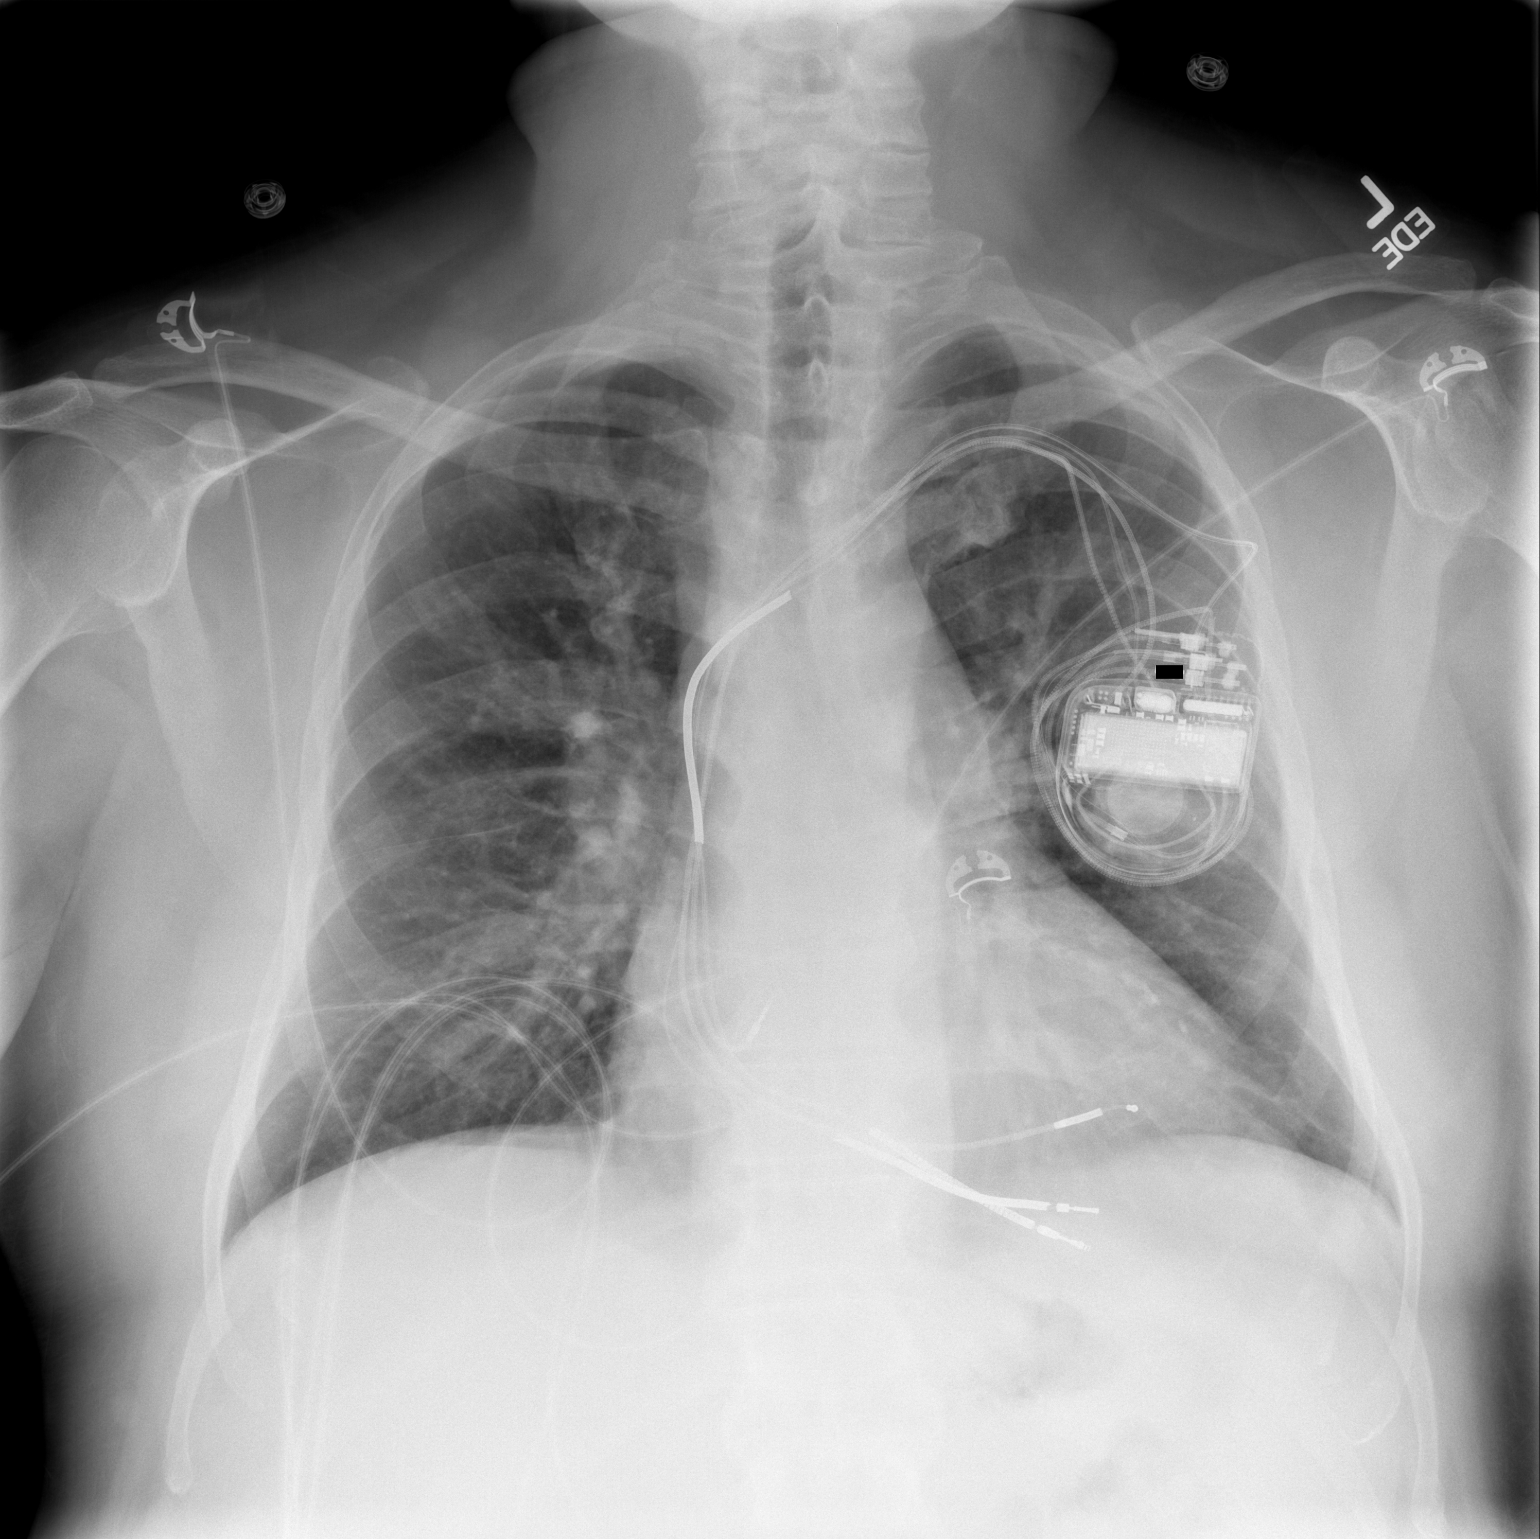

[w chest lat]
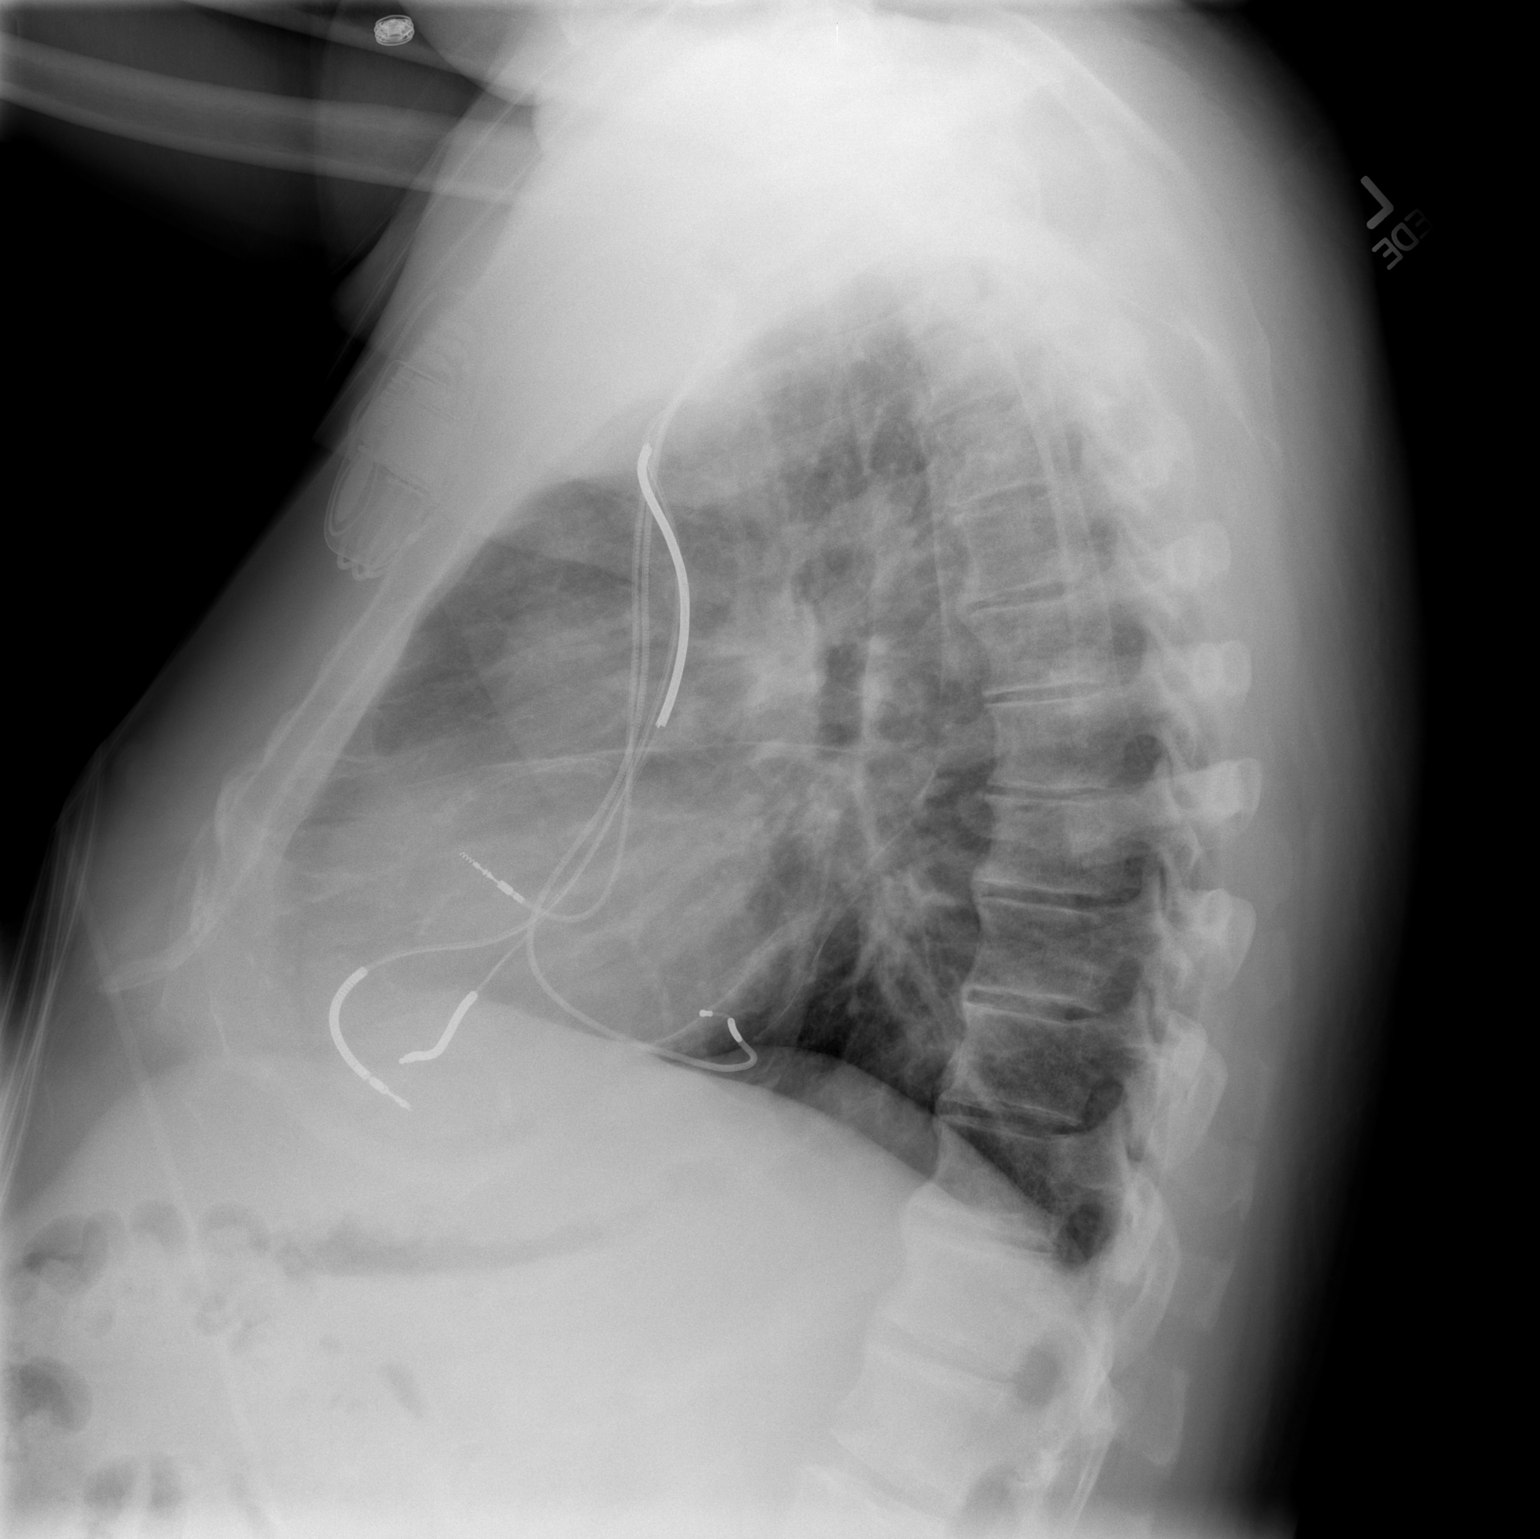

[2 of 2 positions shown; findings below may reference images not displayed]

FINDINGS: Left subclavian AICD stable position.  Heart size upper
limits normal.  Coronary calcifications noted.  Lungs are clear.
Spurring in the lower thoracic spine.
IMPRESSION: Stable chest.  No acute disease.

## 2012-03-14 ENCOUNTER — Encounter (HOSPITAL_COMMUNITY): Payer: Self-pay | Admitting: *Deleted

## 2012-03-14 ENCOUNTER — Emergency Department (HOSPITAL_COMMUNITY)
Admission: EM | Admit: 2012-03-14 | Discharge: 2012-03-15 | Disposition: A | Discharge status: Home / Self Care | Payer: Medicare Other | Attending: Emergency Medicine | Admitting: Emergency Medicine

## 2012-03-14 ENCOUNTER — Emergency Department (HOSPITAL_COMMUNITY): Payer: Medicare Other

## 2012-03-14 DIAGNOSIS — E785 Hyperlipidemia, unspecified: Secondary | ICD-10-CM | POA: Insufficient documentation

## 2012-03-14 DIAGNOSIS — E119 Type 2 diabetes mellitus without complications: Secondary | ICD-10-CM | POA: Insufficient documentation

## 2012-03-14 DIAGNOSIS — M79672 Pain in left foot: Secondary | ICD-10-CM

## 2012-03-14 DIAGNOSIS — I129 Hypertensive chronic kidney disease with stage 1 through stage 4 chronic kidney disease, or unspecified chronic kidney disease: Secondary | ICD-10-CM | POA: Insufficient documentation

## 2012-03-14 DIAGNOSIS — Z79899 Other long term (current) drug therapy: Secondary | ICD-10-CM | POA: Insufficient documentation

## 2012-03-14 DIAGNOSIS — Z794 Long term (current) use of insulin: Secondary | ICD-10-CM | POA: Insufficient documentation

## 2012-03-14 DIAGNOSIS — N189 Chronic kidney disease, unspecified: Secondary | ICD-10-CM | POA: Insufficient documentation

## 2012-03-14 DIAGNOSIS — M25579 Pain in unspecified ankle and joints of unspecified foot: Secondary | ICD-10-CM | POA: Insufficient documentation

## 2012-03-14 HISTORY — DX: Essential (primary) hypertension: I10

## 2012-03-14 LAB — DIFFERENTIAL
Basophils Absolute: 0 10*3/uL (ref 0.0–0.1)
Basophils Relative: 1 % (ref 0–1)
Eosinophils Absolute: 0.2 10*3/uL (ref 0.0–0.7)
Monocytes Relative: 12 % (ref 3–12)
Neutro Abs: 2.3 10*3/uL (ref 1.7–7.7)
Neutrophils Relative %: 67 % (ref 43–77)

## 2012-03-14 LAB — CBC
MCH: 29.6 pg (ref 26.0–34.0)
MCHC: 34.9 g/dL (ref 30.0–36.0)
Platelets: 168 10*3/uL (ref 150–400)
RDW: 14.6 % (ref 11.5–15.5)

## 2012-03-14 LAB — GLUCOSE, CAPILLARY: Glucose-Capillary: 213 mg/dL — ABNORMAL HIGH (ref 70–99)

## 2012-03-14 MED ORDER — ONDANSETRON HCL 4 MG/2ML IJ SOLN: 4.0000 mg | INTRAMUSCULAR | Status: DC | PRN

## 2012-03-14 MED ORDER — MORPHINE SULFATE 4 MG/ML IJ SOLN
6.0000 mg | Freq: Once | INTRAMUSCULAR | Status: DC
  Filled 2012-03-14: qty 2

## 2012-03-14 NOTE — ED Notes (Signed)
Patient transported to X-ray 

## 2012-03-14 NOTE — ED Notes (Signed)
Provider at bedside

## 2012-03-14 NOTE — ED Notes (Signed)
Patient with left foot swelling since Friday 4/12 and saw his MD on Monday.  Patient was recommended a CT scan but he cannot since he has a pacemaker.  Patient got a call from the Podiatrist and was told to come in for evaluation.  Patient is wearing the ortho boot so he can walk.

## 2012-03-14 NOTE — ED Provider Notes (Signed)
History     CSN: [id number]  Arrival date & time 03/14/12  2050   First MD Initiated Contact with Patient 03/14/12 2317      Chief Complaint  Patient presents with  . Foot Pain    (Consider location/radiation/quality/duration/timing/severity/associated sxs/prior treatment) HPI Comments: Patient with a history of diabetes (2 toe amputation), hypertension, hyperlipidemia, and chronic kidney disease presents emergency department with chief complaint of left leg swelling and foot pain.  Symptom onset began Friday, April 12.  The patient was evaluated by his primary care physician 3 days ago on Monday.  At that time patient was advised to get an x-ray done and to followup with his podiatrist.  Patient was unable to make a podiatrist appointment D2 unavailability.  Patient received a call from his PCP yesterday recommended the patient get an MRI of his foot however the patient is unable to get her MRI do to his pacemaker.  Patient states his pain has worsened and is a 10/10 in severity.  The history is provided by the patient.    Past Medical History  Diagnosis Date  . Diabetes mellitus   . Hypertension     Past Surgical History  Procedure Date  . Internal pacemaker     History reviewed. No pertinent family history.  History  Substance Use Topics  . Smoking status: Never Smoker   . Smokeless tobacco: Not on file  . Alcohol Use: No      Review of Systems  Constitutional: Negative for fever, chills and appetite change.  HENT: Negative for congestion.   Eyes: Negative for visual disturbance.  Respiratory: Negative for shortness of breath.   Cardiovascular: Negative for chest pain and leg swelling.  Gastrointestinal: Negative for abdominal pain.  Genitourinary: Negative for dysuria, urgency and frequency.  Musculoskeletal: Positive for gait problem.  Skin: Positive for color change. Negative for wound.  Neurological: Negative for dizziness, syncope, weakness,  light-headedness, numbness and headaches.  Psychiatric/Behavioral: Negative for confusion.  All other systems reviewed and are negative.    Allergies  Cephalosporins; Morphine and related; and Penicillins  Home Medications   Current Outpatient Rx  Name Route Sig Dispense Refill  . AMIODARONE HCL 200 MG PO TABS Oral Take 200 mg by mouth 2 (two) times daily.      . ASPIRIN EC 81 MG PO TBEC Oral Take 81 mg by mouth daily.      Marland Kitchen CARVEDILOL 6.25 MG PO TABS Oral Take 9.375 mg by mouth 2 (two) times daily with a meal.      . VITAMIN D 1000 UNITS PO TABS Oral Take 1,000 Units by mouth daily.    Marland Kitchen CINNAMON 500 MG PO CAPS Oral Take 1,000 mg by mouth 2 (two) times daily.      Marland Kitchen DOXYCYCLINE HYCLATE 100 MG PO TABS Oral Take 100 mg by mouth 2 (two) times daily.    . FENOFIBRATE 145 MG PO TABS Oral Take 145 mg by mouth daily.    . FUROSEMIDE 40 MG PO TABS Oral Take 40 mg by mouth 2 (two) times daily.      Marland Kitchen GABAPENTIN 300 MG PO CAPS Oral Take 300 mg by mouth 3 (three) times daily.      Marland Kitchen GLIPIZIDE 5 MG PO TABS Oral Take 5 mg by mouth 2 (two) times daily before a meal.    . HYDRALAZINE HCL 25 MG PO TABS Oral Take 25 mg by mouth 3 (three) times daily.      . INSULIN ASPART  100 UNIT/ML [place] SOLN Subcutaneous Inject 1-12 Units into the skin 3 (three) times daily before meals.    . INSULIN GLARGINE 100 UNIT/ML [place] SOLN Subcutaneous Inject 55 Units into the skin daily.     . ISOSORBIDE MONONITRATE ER 60 MG PO TB24 Oral Take 90 mg by mouth at bedtime.      Marland Kitchen MAGNESIUM 30 MG PO TABS Oral Take 30 mg by mouth 2 (two) times daily.    Marland Kitchen OMEPRAZOLE 20 MG PO CPDR Oral Take 40 mg by mouth 2 (two) times daily.    Marland Kitchen RIVASTIGMINE 4.6 MG/24HR TD PT24 Transdermal Place 1 patch onto the skin daily.    Marland Kitchen SPIRONOLACTONE 25 MG PO TABS Oral Take 25 mg by mouth 2 (two) times daily.    Marland Kitchen TICAGRELOR 90 MG PO TABS Oral Take 90 mg by mouth daily.    Marland Kitchen VITAMIN B-12 1000 MCG PO TABS Oral Take 1,000 mcg by mouth daily.    Marland Kitchen  NITROGLYCERIN 0.4 MG SL SUBL Sublingual Place 0.4 mg under the tongue every 5 (five) minutes as needed. For chest pain       BP 120/65  Pulse 74  Temp(Src) 98 F (36.7 C) (Oral)  Resp 18  SpO2 93%  Physical Exam  Nursing note and vitals reviewed. Constitutional: He is oriented to person, place, and time. He appears well-developed and well-nourished. No distress.  HENT:  Head: Normocephalic and atraumatic.  Eyes: Conjunctivae and EOM are normal.  Neck: Normal range of motion.  Cardiovascular: Normal rate, regular rhythm, normal heart sounds and intact distal pulses.   Pulmonary/Chest: Effort normal. No respiratory distress.  Musculoskeletal: Normal range of motion.       Left foot with 2 toe amputation, ttp   Neurological: He is alert and oriented to person, place, and time.  Skin: Skin is warm and dry. No rash noted. He is not diaphoretic.       Warmth of LLE, but no erythema. 2 + pitting edema & ttp of anterior leg down to toes.   Psychiatric: He has a normal mood and affect. His behavior is normal.    ED Course  Procedures (including critical care time)  Labs Reviewed - No data to display No results found.   No diagnosis found.    MDM  Foot pain  Pt to ED for evaluation of his week long, gradually worsening, atraumatic left foot pain/swelling. Question diabetic foot infection vs. Gout. Pt care resumed by Dr. Ethelda Chick. Pt re-evaluated prior to transfer and is hemodynamically stable in NAD with pain managed.         Jaci Carrel, [place] 03/18/12 [phone]

## 2012-03-15 ENCOUNTER — Emergency Department (HOSPITAL_COMMUNITY): Payer: Medicare Other

## 2012-03-15 LAB — BASIC METABOLIC PANEL
BUN: 51 mg/dL — ABNORMAL HIGH (ref 6–23)
GFR calc Af Amer: 32 mL/min — ABNORMAL LOW (ref >90)
GFR calc non Af Amer: 28 mL/min — ABNORMAL LOW (ref >90)
Potassium: 3.6 mEq/L (ref 3.5–5.1)
Sodium: 133 mEq/L — ABNORMAL LOW (ref 135–145)

## 2012-03-15 LAB — GLUCOSE, CAPILLARY: Glucose-Capillary: 200 mg/dL — ABNORMAL HIGH (ref 70–99)

## 2012-03-15 MED ORDER — SPIRONOLACTONE 25 MG PO TABS
25.0000 mg | ORAL_TABLET | Freq: Two times a day (BID) | ORAL | Status: DC
  Administered 2012-03-15: 25 mg via ORAL
  Filled 2012-03-15 (×2): qty 1

## 2012-03-15 MED ORDER — ONDANSETRON HCL 4 MG/2ML IJ SOLN
4.0000 mg | Freq: Once | INTRAMUSCULAR | Status: AC
  Administered 2012-03-15: 4 mg via INTRAVENOUS

## 2012-03-15 MED ORDER — GLIPIZIDE 5 MG PO TABS
5.0000 mg | ORAL_TABLET | Freq: Two times a day (BID) | ORAL | Status: DC
  Administered 2012-03-15: 5 mg via ORAL
  Filled 2012-03-15 (×2): qty 1

## 2012-03-15 MED ORDER — PANTOPRAZOLE SODIUM 40 MG PO TBEC
40.0000 mg | DELAYED_RELEASE_TABLET | Freq: Every day | ORAL | Status: DC
  Administered 2012-03-15: 40 mg via ORAL
  Filled 2012-03-15: qty 1

## 2012-03-15 MED ORDER — CARVEDILOL 6.25 MG PO TABS
9.3750 mg | ORAL_TABLET | Freq: Two times a day (BID) | ORAL | Status: DC
  Administered 2012-03-15: 9.375 mg via ORAL
  Filled 2012-03-15 (×2): qty 1

## 2012-03-15 MED ORDER — ONDANSETRON 4 MG PO TBDP
ORAL_TABLET | ORAL | Status: AC
  Administered 2012-03-15: 11:00:00
  Filled 2012-03-15: qty 1

## 2012-03-15 MED ORDER — HYDROCODONE-ACETAMINOPHEN 5-500 MG PO TABS
1.0000 | ORAL_TABLET | Freq: Four times a day (QID) | ORAL | Status: AC | PRN
Start: 2012-03-15 — End: 2012-03-25

## 2012-03-15 MED ORDER — INSULIN ASPART 100 UNIT/ML ~~LOC~~ SOLN
2.0000 [IU] | Freq: Once | SUBCUTANEOUS | Status: AC
  Administered 2012-03-15: 2 [IU] via SUBCUTANEOUS

## 2012-03-15 MED ORDER — HYDRALAZINE HCL 25 MG PO TABS
25.0000 mg | ORAL_TABLET | Freq: Three times a day (TID) | ORAL | Status: DC
  Administered 2012-03-15: 25 mg via ORAL
  Filled 2012-03-15 (×2): qty 1

## 2012-03-15 MED ORDER — INSULIN ASPART 100 UNIT/ML ~~LOC~~ SOLN
2.0000 [IU] | Freq: Once | SUBCUTANEOUS | Status: DC
  Filled 2012-03-15: qty 1

## 2012-03-15 MED ORDER — FUROSEMIDE 20 MG PO TABS: 40.0000 mg | ORAL_TABLET | Freq: Two times a day (BID) | ORAL | Status: DC

## 2012-03-15 MED ORDER — INSULIN ASPART 100 UNIT/ML ~~LOC~~ SOLN: 1.0000 [IU] | Freq: Three times a day (TID) | SUBCUTANEOUS | Status: DC

## 2012-03-15 MED ORDER — ONDANSETRON 4 MG PO TBDP
4.0000 mg | ORAL_TABLET | Freq: Once | ORAL | Status: AC
  Administered 2012-03-15: 4 mg via ORAL

## 2012-03-15 MED ORDER — GABAPENTIN 300 MG PO CAPS
300.0000 mg | ORAL_CAPSULE | Freq: Three times a day (TID) | ORAL | Status: DC
  Administered 2012-03-15: 300 mg via ORAL
  Filled 2012-03-15 (×2): qty 1

## 2012-03-15 MED ORDER — TECHNETIUM TC 99M MEDRONATE IV KIT
25.0000 | PACK | Freq: Once | INTRAVENOUS | Status: AC | PRN
  Administered 2012-03-15: 25 via INTRAVENOUS

## 2012-03-15 MED ORDER — OXYCODONE-ACETAMINOPHEN 5-325 MG PO TABS
1.0000 | ORAL_TABLET | Freq: Once | ORAL | Status: AC
  Administered 2012-03-15: 1 via ORAL
  Filled 2012-03-15: qty 1

## 2012-03-15 MED ORDER — AMIODARONE HCL 200 MG PO TABS
200.0000 mg | ORAL_TABLET | Freq: Two times a day (BID) | ORAL | Status: DC
  Administered 2012-03-15: 200 mg via ORAL
  Filled 2012-03-15: qty 1

## 2012-03-15 MED ORDER — ONDANSETRON HCL 4 MG/2ML IJ SOLN
INTRAMUSCULAR | Status: AC
  Filled 2012-03-15: qty 2

## 2012-03-15 MED ORDER — ASPIRIN EC 81 MG PO TBEC
81.0000 mg | DELAYED_RELEASE_TABLET | Freq: Every day | ORAL | Status: DC
  Administered 2012-03-15: 81 mg via ORAL
  Filled 2012-03-15: qty 1

## 2012-03-15 NOTE — ED Notes (Signed)
Pa aware of pt blood sugar and nausea. Will hold lunch time insulin and medicate pt for nausea

## 2012-03-15 NOTE — ED Notes (Signed)
Complaining of swollen, painful foot for several days. Rates pain as 9/10

## 2012-03-15 NOTE — ED Notes (Signed)
Spoke with nuclear medicine and they are still waiting for dose to arrive to do study

## 2012-03-15 NOTE — ED Provider Notes (Signed)
Medical screening examination/treatment/procedure(s) were conducted as a shared visit with non-physician practitioner(s) and myself.  I personally evaluated the patient during the encounter  Doug Sou, MD 03/15/12 [phone]

## 2012-03-15 NOTE — ED Provider Notes (Addendum)
Patient signed out from Dr. Ethelda Chick. Patient is sitting comfortably in bed awaiting bone scan of left foot to rule out acute osteo. Patient is afebrile and complaining of "a little pain in foot." Patient is eating breakfast. Requesting another percocet for pain. Good pedal pulse and cap refill of 3 remaining toes with prior amputation of toes 2 and 4.   Jenness Corner, PA 03/15/12 0725  Jenness Corner, PA 03/15/12 [phone]  The bone scan shows changes most consistent with inflammation such as gout without evidence of obvious infection such as osteomyelitis. Superficially patient has no signs or symptoms of an infected foot. He has mild soft tissue swelling but there is no erythema or heat. There is no drainage. Patient has a scheduled appointment with his podiatrist next week, on Thursday. Patient states that his podiatrist does not work on Friday and therefore we would not go to contact him today to discuss the bone scan. We discussed changes, or worsening signs or symptoms that should warned immediate return to emergency department. Patient voices understanding is agreeable we'll plan. Good pedal pulse and cap refill of remaining toes.   Jenness Corner, [place] 03/15/12 1521

## 2012-03-15 NOTE — ED Provider Notes (Signed)
Complains of Left foot swelling and pain onset 03/08/2012, no other complaint no shortness of breath no fever. No treatment prior to coming here On exam alert awake Glasgow Coma Score 15 left lower extremity is swollen, tender and ecchymotic foot , medial aspect. Mildly tender at [facility], no calf or thigh tenderness. DP pulse 2+  Per discussion with radiologist x-ray is consistent with osteomyelitis acute versus chronic Patient is unable to have MRI scan to have a pacemaker Bone scan was suggested to determine whether osteomyelitis is acute. Patient reports no history of osteomyelitis or infection in his foot Will obtain a bone scan in the morning  Doug Sou, MD 03/15/12 [phone]

## 2012-03-15 NOTE — ED Notes (Signed)
CBG 187 Rn notified [address]

## 2012-03-15 NOTE — Discharge Instructions (Signed)
Important to followup with your podiatrist at your already rescheduled appointment next week for further evaluation and management of ongoing foot pain and likely origin of pain being inflammation. However at any time if you have increasing pain, changes of skin such as redness or swelling, fevers, or any other emergent changing or worsening symptoms then you should return to emergency department immediately for further evaluation. You may continue to take Vicodin as needed for pain. Do not drive or operate machinery Vicodin use. May ice and elevate for additional pain relief.

## 2012-03-22 NOTE — ED Provider Notes (Signed)
Medical screening examination/treatment/procedure(s) were conducted as a shared visit with non-physician practitioner(s) and myself.  I personally evaluated the patient during the encounter  Doug Sou, MD 03/22/12 [phone]

## 2012-03-22 NOTE — ED Provider Notes (Signed)
Medical screening examination/treatment/procedure(s) were conducted as a shared visit with non-physician practitioner(s) and myself.  I personally evaluated the patient during the encounter  Doug Sou, MD 03/22/12 [phone]

## 2012-06-17 ENCOUNTER — Emergency Department (HOSPITAL_COMMUNITY): Payer: Medicare Other

## 2012-06-17 ENCOUNTER — Inpatient Hospital Stay (HOSPITAL_COMMUNITY)
Admission: EM | Admit: 2012-06-17 | Discharge: 2012-06-20 | DRG: 057 | Discharge status: Against Medical Advice | Payer: Medicare Other | Attending: Internal Medicine | Admitting: Internal Medicine

## 2012-06-17 ENCOUNTER — Encounter (HOSPITAL_COMMUNITY): Payer: Self-pay

## 2012-06-17 DIAGNOSIS — K219 Gastro-esophageal reflux disease without esophagitis: Secondary | ICD-10-CM | POA: Diagnosis present

## 2012-06-17 DIAGNOSIS — I509 Heart failure, unspecified: Secondary | ICD-10-CM

## 2012-06-17 DIAGNOSIS — R531 Weakness: Secondary | ICD-10-CM

## 2012-06-17 DIAGNOSIS — E1169 Type 2 diabetes mellitus with other specified complication: Secondary | ICD-10-CM | POA: Diagnosis present

## 2012-06-17 DIAGNOSIS — I472 Ventricular tachycardia, unspecified: Secondary | ICD-10-CM

## 2012-06-17 DIAGNOSIS — I10 Essential (primary) hypertension: Secondary | ICD-10-CM

## 2012-06-17 DIAGNOSIS — T827XXA Infection and inflammatory reaction due to other cardiac and vascular devices, implants and grafts, initial encounter: Secondary | ICD-10-CM

## 2012-06-17 DIAGNOSIS — G8190 Hemiplegia, unspecified affecting unspecified side: Principal | ICD-10-CM | POA: Diagnosis present

## 2012-06-17 DIAGNOSIS — I5022 Chronic systolic (congestive) heart failure: Secondary | ICD-10-CM | POA: Diagnosis present

## 2012-06-17 DIAGNOSIS — B999 Unspecified infectious disease: Secondary | ICD-10-CM

## 2012-06-17 DIAGNOSIS — I255 Ischemic cardiomyopathy: Secondary | ICD-10-CM

## 2012-06-17 DIAGNOSIS — I69998 Other sequelae following unspecified cerebrovascular disease: Secondary | ICD-10-CM

## 2012-06-17 DIAGNOSIS — I480 Paroxysmal atrial fibrillation: Secondary | ICD-10-CM | POA: Diagnosis present

## 2012-06-17 DIAGNOSIS — Z950 Presence of cardiac pacemaker: Secondary | ICD-10-CM

## 2012-06-17 DIAGNOSIS — I2510 Atherosclerotic heart disease of native coronary artery without angina pectoris: Secondary | ICD-10-CM | POA: Diagnosis present

## 2012-06-17 DIAGNOSIS — Y849 Medical procedure, unspecified as the cause of abnormal reaction of the patient, or of later complication, without mention of misadventure at the time of the procedure: Secondary | ICD-10-CM | POA: Diagnosis present

## 2012-06-17 DIAGNOSIS — R509 Fever, unspecified: Secondary | ICD-10-CM

## 2012-06-17 DIAGNOSIS — R1110 Vomiting, unspecified: Secondary | ICD-10-CM | POA: Diagnosis present

## 2012-06-17 DIAGNOSIS — R5383 Other fatigue: Secondary | ICD-10-CM | POA: Diagnosis present

## 2012-06-17 DIAGNOSIS — I4891 Unspecified atrial fibrillation: Secondary | ICD-10-CM

## 2012-06-17 DIAGNOSIS — R5381 Other malaise: Secondary | ICD-10-CM | POA: Diagnosis present

## 2012-06-17 DIAGNOSIS — I129 Hypertensive chronic kidney disease with stage 1 through stage 4 chronic kidney disease, or unspecified chronic kidney disease: Secondary | ICD-10-CM | POA: Diagnosis present

## 2012-06-17 DIAGNOSIS — Z8249 Family history of ischemic heart disease and other diseases of the circulatory system: Secondary | ICD-10-CM

## 2012-06-17 DIAGNOSIS — R197 Diarrhea, unspecified: Secondary | ICD-10-CM | POA: Diagnosis present

## 2012-06-17 DIAGNOSIS — E785 Hyperlipidemia, unspecified: Secondary | ICD-10-CM

## 2012-06-17 DIAGNOSIS — E11649 Type 2 diabetes mellitus with hypoglycemia without coma: Secondary | ICD-10-CM

## 2012-06-17 DIAGNOSIS — Z794 Long term (current) use of insulin: Secondary | ICD-10-CM

## 2012-06-17 DIAGNOSIS — I2589 Other forms of chronic ischemic heart disease: Secondary | ICD-10-CM | POA: Diagnosis present

## 2012-06-17 DIAGNOSIS — N183 Chronic kidney disease, stage 3 unspecified: Secondary | ICD-10-CM

## 2012-06-17 HISTORY — DX: Unspecified atrial fibrillation: I48.91

## 2012-06-17 HISTORY — DX: Chronic kidney disease, stage 3 unspecified: N18.30

## 2012-06-17 HISTORY — DX: Heart failure, unspecified: I50.9

## 2012-06-17 HISTORY — DX: Chronic kidney disease, stage 3 (moderate): N18.3

## 2012-06-17 HISTORY — DX: Hyperlipidemia, unspecified: E78.5

## 2012-06-17 HISTORY — DX: Pneumonia, unspecified organism: J18.9

## 2012-06-17 HISTORY — DX: Cerebral infarction, unspecified: I63.9

## 2012-06-17 HISTORY — DX: Gastro-esophageal reflux disease without esophagitis: K21.9

## 2012-06-17 HISTORY — DX: Unspecified asthma, uncomplicated: J45.909

## 2012-06-17 LAB — POCT I-STAT, CHEM 8
BUN: 35 mg/dL — ABNORMAL HIGH (ref 6–23)
Calcium, Ion: 1.19 mmol/L (ref 1.12–1.23)
Chloride: 97 mEq/L (ref 96–112)
Creatinine, Ser: 2.2 mg/dL — ABNORMAL HIGH (ref 0.50–1.35)
Glucose, Bld: 213 mg/dL — ABNORMAL HIGH (ref 70–99)

## 2012-06-17 LAB — COMPREHENSIVE METABOLIC PANEL
ALT: 33 U/L (ref 0–53)
Alkaline Phosphatase: 70 U/L (ref 39–117)
CO2: 28 mEq/L (ref 19–32)
GFR calc Af Amer: 34 mL/min — ABNORMAL LOW (ref >90)
Glucose, Bld: 219 mg/dL — ABNORMAL HIGH (ref 70–99)
Potassium: 4.1 mEq/L (ref 3.5–5.1)
Sodium: 131 mEq/L — ABNORMAL LOW (ref 135–145)
Total Protein: 7.4 g/dL (ref 6.0–8.3)

## 2012-06-17 LAB — DIFFERENTIAL
Eosinophils Absolute: 0.1 10*3/uL (ref 0.0–0.7)
Lymphocytes Relative: 8 % — ABNORMAL LOW (ref 12–46)
Lymphs Abs: 0.3 10*3/uL — ABNORMAL LOW (ref 0.7–4.0)
Neutrophils Relative %: 81 % — ABNORMAL HIGH (ref 43–77)

## 2012-06-17 LAB — URINALYSIS, ROUTINE W REFLEX MICROSCOPIC
Bilirubin Urine: NEGATIVE
Glucose, UA: 100 mg/dL — AB
Hgb urine dipstick: NEGATIVE
Ketones, ur: NEGATIVE mg/dL
Protein, ur: NEGATIVE mg/dL

## 2012-06-17 LAB — RAPID URINE DRUG SCREEN, HOSP PERFORMED
Amphetamines: NOT DETECTED
Benzodiazepines: NOT DETECTED
Cocaine: NOT DETECTED

## 2012-06-17 LAB — CBC
Platelets: 113 10*3/uL — ABNORMAL LOW (ref 150–400)
RBC: 3.83 MIL/uL — ABNORMAL LOW (ref 4.22–5.81)
WBC: 3.3 10*3/uL — ABNORMAL LOW (ref 4.0–10.5)

## 2012-06-17 LAB — PROTIME-INR
INR: 1.17 (ref 0.00–1.49)
Prothrombin Time: 15.1 seconds (ref 11.6–15.2)

## 2012-06-17 LAB — TROPONIN I: Troponin I: <0.3 ng/mL (ref <0.30)

## 2012-06-17 LAB — GLUCOSE, CAPILLARY: Glucose-Capillary: 70 mg/dL (ref 70–99)

## 2012-06-17 LAB — APTT: aPTT: 30 seconds (ref 24–37)

## 2012-06-17 MED ORDER — VITAMIN B-12 250 MCG PO TABS: 250.0000 ug | ORAL_TABLET | Freq: Every day | ORAL | Status: DC

## 2012-06-17 MED ORDER — DEXTROSE-NACL 5-0.9 % IV SOLN
INTRAVENOUS | Status: DC
  Administered 2012-06-17: 22:00:00 via INTRAVENOUS

## 2012-06-17 MED ORDER — CYANOCOBALAMIN 250 MCG PO TABS
250.0000 ug | ORAL_TABLET | Freq: Every day | ORAL | Status: DC
  Administered 2012-06-18 – 2012-06-20 (×3): 250 ug via ORAL
  Filled 2012-06-17 (×4): qty 1

## 2012-06-17 MED ORDER — FENOFIBRATE 160 MG PO TABS
160.0000 mg | ORAL_TABLET | Freq: Every day | ORAL | Status: DC
  Administered 2012-06-18 – 2012-06-20 (×3): 160 mg via ORAL
  Filled 2012-06-17 (×4): qty 1

## 2012-06-17 MED ORDER — AMIODARONE HCL 200 MG PO TABS
200.0000 mg | ORAL_TABLET | Freq: Two times a day (BID) | ORAL | Status: DC
  Administered 2012-06-18 – 2012-06-20 (×5): 200 mg via ORAL
  Filled 2012-06-17 (×7): qty 1

## 2012-06-17 MED ORDER — SODIUM CHLORIDE 0.9 % IV BOLUS (SEPSIS)
250.0000 mL | Freq: Once | INTRAVENOUS | Status: AC
  Administered 2012-06-17: 250 mL via INTRAVENOUS

## 2012-06-17 MED ORDER — COLCHICINE 0.6 MG PO TABS
0.6000 mg | ORAL_TABLET | Freq: Every day | ORAL | Status: DC
  Administered 2012-06-18 – 2012-06-20 (×3): 0.6 mg via ORAL
  Filled 2012-06-17 (×4): qty 1

## 2012-06-17 MED ORDER — ISOSORBIDE MONONITRATE ER 60 MG PO TB24
90.0000 mg | ORAL_TABLET | Freq: Every day | ORAL | Status: DC
  Administered 2012-06-18 – 2012-06-19 (×2): 90 mg via ORAL
  Filled 2012-06-17 (×4): qty 1

## 2012-06-17 MED ORDER — HYDRALAZINE HCL 25 MG PO TABS
25.0000 mg | ORAL_TABLET | Freq: Three times a day (TID) | ORAL | Status: DC
  Administered 2012-06-18 – 2012-06-20 (×6): 25 mg via ORAL
  Filled 2012-06-17 (×10): qty 1

## 2012-06-17 MED ORDER — INSULIN ASPART 100 UNIT/ML ~~LOC~~ SOLN: 0.0000 [IU] | Freq: Three times a day (TID) | SUBCUTANEOUS | Status: DC

## 2012-06-17 MED ORDER — CARVEDILOL 6.25 MG PO TABS
9.3750 mg | ORAL_TABLET | Freq: Two times a day (BID) | ORAL | Status: DC
  Administered 2012-06-18 – 2012-06-20 (×4): 9.375 mg via ORAL
  Filled 2012-06-17 (×8): qty 1

## 2012-06-17 MED ORDER — HYDROCODONE-ACETAMINOPHEN 5-325 MG PO TABS
1.0000 | ORAL_TABLET | Freq: Every day | ORAL | Status: DC
  Administered 2012-06-19: 1 via ORAL
  Filled 2012-06-17 (×2): qty 1

## 2012-06-17 MED ORDER — FEBUXOSTAT 40 MG PO TABS
40.0000 mg | ORAL_TABLET | Freq: Every day | ORAL | Status: DC
  Administered 2012-06-18 – 2012-06-20 (×3): 40 mg via ORAL
  Filled 2012-06-17 (×4): qty 1

## 2012-06-17 MED ORDER — TICAGRELOR 90 MG PO TABS
90.0000 mg | ORAL_TABLET | Freq: Two times a day (BID) | ORAL | Status: DC
  Administered 2012-06-18 – 2012-06-20 (×5): 90 mg via ORAL
  Filled 2012-06-17 (×7): qty 1

## 2012-06-17 MED ORDER — ENOXAPARIN SODIUM 40 MG/0.4ML ~~LOC~~ SOLN
40.0000 mg | SUBCUTANEOUS | Status: DC
  Administered 2012-06-17 – 2012-06-18 (×2): 40 mg via SUBCUTANEOUS
  Filled 2012-06-17 (×4): qty 0.4

## 2012-06-17 MED ORDER — GABAPENTIN 300 MG PO CAPS
300.0000 mg | ORAL_CAPSULE | Freq: Three times a day (TID) | ORAL | Status: DC
  Administered 2012-06-18 – 2012-06-20 (×7): 300 mg via ORAL
  Filled 2012-06-17 (×10): qty 1

## 2012-06-17 MED ORDER — DONEPEZIL HCL 5 MG PO TABS
5.0000 mg | ORAL_TABLET | Freq: Every day | ORAL | Status: DC
  Administered 2012-06-18 – 2012-06-19 (×2): 5 mg via ORAL
  Filled 2012-06-17 (×4): qty 1

## 2012-06-17 MED ORDER — MAGNESIUM OXIDE 400 (241.3 MG) MG PO TABS
400.0000 mg | ORAL_TABLET | Freq: Two times a day (BID) | ORAL | Status: DC
  Administered 2012-06-18 – 2012-06-20 (×5): 400 mg via ORAL
  Filled 2012-06-17 (×7): qty 1

## 2012-06-17 MED ORDER — INSULIN ASPART 100 UNIT/ML ~~LOC~~ SOLN: 0.0000 [IU] | SUBCUTANEOUS | Status: DC | PRN

## 2012-06-17 MED ORDER — NITROGLYCERIN 0.4 MG SL SUBL: 0.4000 mg | SUBLINGUAL_TABLET | SUBLINGUAL | Status: DC | PRN

## 2012-06-17 MED ORDER — SODIUM CHLORIDE 0.9 % IV SOLN
INTRAVENOUS | Status: DC
  Administered 2012-06-17: 50 mL/h via INTRAVENOUS

## 2012-06-17 MED ORDER — SENNOSIDES-DOCUSATE SODIUM 8.6-50 MG PO TABS: 1.0000 | ORAL_TABLET | Freq: Every evening | ORAL | Status: DC | PRN

## 2012-06-17 MED ORDER — TAMSULOSIN HCL 0.4 MG PO CAPS
0.4000 mg | ORAL_CAPSULE | Freq: Every day | ORAL | Status: DC
  Administered 2012-06-18 – 2012-06-20 (×3): 0.4 mg via ORAL
  Filled 2012-06-17 (×4): qty 1

## 2012-06-17 MED ORDER — PANTOPRAZOLE SODIUM 40 MG PO TBEC
40.0000 mg | DELAYED_RELEASE_TABLET | Freq: Every day | ORAL | Status: DC
  Administered 2012-06-18 – 2012-06-19 (×2): 40 mg via ORAL
  Filled 2012-06-17 (×3): qty 1

## 2012-06-17 MED ORDER — MAGNESIUM OXIDE 400 MG PO TABS: 400.0000 mg | ORAL_TABLET | Freq: Two times a day (BID) | ORAL | Status: DC

## 2012-06-17 NOTE — ED Notes (Signed)
Per ems- Pt states at 0430 began feeling generally weak with n/v. At 0830 pt noted right sided weakness, right sided facial droop, slurred speech, and a left sided temporal headache. Pt has left sided deficit from previous stroke, so pt has bilateral weakness. BP- 126/72, HR- 106, R-16, o2- 98%, CBG- 239. ABCs intact. 22g IV right lateral forearm.

## 2012-06-17 NOTE — ED Notes (Signed)
Spoke to Dr. Lavera Guise and informed him that the patient has a bed in [phone] and that the patient does not have admission orders yet. Informed then nurse that he is going to meet the patient in the room.

## 2012-06-17 NOTE — Code Documentation (Signed)
Pt LSN was 2200 on 06/16/12.  Code stroke called: 1112, Arrival to ED: 1119, Stroke team arrived: 1121, EDP exam: 1119, Phlebotomist arrived: 119, Pt arrived in CT: 1122. Code stroke was cancelled at 1150 as pt was not a TPA candidate.

## 2012-06-17 NOTE — ED Notes (Signed)
As pt took sip of water, he began to clear throat and cough. Swallow screen ended.

## 2012-06-17 NOTE — ED Notes (Signed)
Pt is alert, delayed speech noted, slurred speech, pt has right sided facial droop, new onset of right sided weakness. Pt has positive drift on right arm and leg. Pt is resting comfortably, denies nausea currently, denies headache.

## 2012-06-17 NOTE — Progress Notes (Addendum)
[name], is a 56 y.o. male,   MRN: [id number]  -  DOB - 1956/06/28  Outpatient Primary MD for the patient is HAGUE, Myrene Galas, MD  Chief Complaint:    Chief Complaint  Patient presents with  . Code Stroke     Blood pressure 113/65, pulse 71, temperature 97.7 F (36.5 C), temperature source Oral, resp. rate 14, SpO2 98.00%.  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   Reports he awakened in the middle of the night with vomiting x 4 episodes and diarrhea.  He ate dinner at home last night and denies any sick contacts.  This am he felt numb and weak on the right side.  Code Stroke called.  TPA not able to be administered.  Neuro saw patient in ED and would like for him to be admitted.  CT shows no evidence of acute stroke.  Unable to do MRI due to ICD.  Now in the ED he is sleepy, and his BP is soft (102/70) given that he may have had a cerebral event.   I will order some fluids.  250 bolus then 50/ml/hour and hourly BP checks.  He will require careful monitoring given his CHF.  Will request admission to neuro tele.  CKD.  Creatinine appears stable.  Algis Downs, PA-C [facility] Pager: [phone]

## 2012-06-17 NOTE — Consult Note (Signed)
Reason for Consult: Code stroke Referring Physician: Anitra Lauth  CC: Right sided numbness and weakness  HPI: [name] is an 56 y.o. male presenting with reports that on yesterday before going to bed he felt weak.  When he awakened in the middle of the night he felt nauseous and had some vomiting.  Was stumbling as he attempted to go to the bathroom.  When he awakened this morning felt numb and weak on the right side.  Patient has deficits on the left secondary to an old infarct.  EMS was called at that time and patient was brought in as a code stroke.    LSN:  [id number] on 06/16/2012 tPA Given:  No;  Outside time window  Past Medical History  Diagnosis Date  . Diabetes mellitus   . Hypertension   . GERD (gastroesophageal reflux disease)   . Dyslipidemia   . Stroke   . CHF (congestive heart failure)   . Atrial fibrillation   . Asthma   . Pneumonia     Past Surgical History  Procedure Date  . Internal pacemaker     History reviewed. No pertinent family history.  Social History:  reports that he has never smoked. He does not have any smokeless tobacco history on file. He reports that he does not drink alcohol or use illicit drugs.  Allergies  Allergen Reactions  . Cephalosporins Nausea Only  . Morphine And Related Other (See Comments)    hypotension  . Penicillins Other (See Comments)    Reaction as a child -unknown    Medications: I have reviewed the patient's current medications. Prior to Admission:  Amiodarone (PACERONE) 200 MG tablet, Take 200 mg by mouth 2 (two) times daily.  , Disp: , Rfl: ;  aspirin EC 81 MG tablet, Take 81 mg by mouth daily.  , Disp: , Rfl: ;  carvedilol (COREG) 6.25 MG tablet, Take 9.375 mg by mouth 2 (two) times daily with a meal.  , Disp: , Rfl: ;  cholecalciferol (VITAMIN D) 1000 UNITS tablet, Take 1,000 Units by mouth daily., Disp: , Rfl:  Cinnamon (HM CINNAMON) 500 MG capsule, Take 1,000 mg by mouth 2 (two) times daily.  , Disp: , Rfl: ;   doxycycline (VIBRA-TABS) 100 MG tablet, Take 100 mg by mouth 2 (two) times daily., Disp: , Rfl: ;  fenofibrate (TRICOR) 145 MG tablet, Take 145 mg by mouth daily., Disp: , Rfl: ;  furosemide (LASIX) 40 MG tablet, Take 40 mg by mouth 2 (two) times daily.  , Disp: , Rfl:  gabapentin (NEURONTIN) 300 MG capsule, Take 300 mg by mouth 3 (three) times daily.  , Disp: , Rfl: ;  glipiZIDE (GLUCOTROL) 5 MG tablet, Take 5 mg by mouth 2 (two) times daily before a meal., Disp: , Rfl: ;  hydrALAZINE (APRESOLINE) 25 MG tablet, Take 25 mg by mouth 3 (three) times daily.  , Disp: , Rfl: ;  insulin aspart (NOVOLOG) 100 UNIT/ML injection, Inject 1-12 Units into the skin 3 (three) times daily before meals., Disp: , Rfl:  insulin glargine (LANTUS) 100 UNIT/ML injection, Inject 55 Units into the skin daily. , Disp: , Rfl: ;  isosorbide mononitrate (IMDUR) 60 MG 24 hr tablet, Take 90 mg by mouth at bedtime.  , Disp: , Rfl: ;  magnesium 30 MG tablet, Take 30 mg by mouth 2 (two) times daily., Disp: , Rfl: ;  nitroGLYCERIN (NITROSTAT) 0.4 MG SL tablet, Place 0.4 mg under the tongue every 5 (five) minutes as  needed. For chest pain , Disp: , Rfl:  omeprazole (PRILOSEC) 20 MG capsule, Take 40 mg by mouth 2 (two) times daily., Disp: , Rfl: ;  rivastigmine (EXELON) 4.6 mg/24hr, Place 1 patch onto the skin daily., Disp: , Rfl: ;  spironolactone (ALDACTONE) 25 MG tablet, Take 25 mg by mouth 2 (two) times daily., Disp: , Rfl: ;  Ticagrelor (BRILINTA) 90 MG TABS tablet, Take 90 mg by mouth daily., Disp: , Rfl:  vitamin B-12 (CYANOCOBALAMIN) 1000 MCG tablet, Take 1,000 mcg by mouth daily., Disp: , Rfl:   ROS: History obtained from the patient  General ROS: negative for - chills, fatigue, fever, night sweats, weight gain or weight loss Psychological ROS: negative for - behavioral disorder, hallucinations, memory difficulties, mood swings or suicidal ideation Ophthalmic ROS: negative for - blurry vision, double vision, eye pain or loss of  vision ENT ROS: negative for - epistaxis, nasal discharge, oral lesions, sore throat, tinnitus or vertigo Allergy and Immunology ROS: negative for - hives or itchy/watery eyes Hematological and Lymphatic ROS: negative for - bleeding problems, bruising or swollen lymph nodes Endocrine ROS: negative for - galactorrhea, hair pattern changes, polydipsia/polyuria or temperature intolerance Respiratory ROS: negative for - cough, hemoptysis, shortness of breath or wheezing Cardiovascular ROS: negative for - chest pain, dyspnea on exertion, edema or irregular heartbeat Gastrointestinal ROS: as per HPI Genito-Urinary ROS: negative for - dysuria, hematuria, incontinence or urinary frequency/urgency Musculoskeletal ROS: negative for - joint swelling or muscular weakness Neurological ROS: as noted in HPI Dermatological ROS: negative for rash and skin lesion changes Physical Examination: Blood pressure 116/66, pulse 70, temperature 97.7 F (36.5 C), temperature source Oral, resp. rate 15, SpO2 98.00%.  Neurologic Examination Mental Status: Alert, oriented.  Speech slurred but fluent.  Able to follow 3 step commands without difficulty. Cranial Nerves: II: visual fields grossly normal, pupils equal, round, reactive to light and accommodation III,IV, VI: ptosis not present, extra-ocular motions intact bilaterally V,VII: Reports unable to smile but with continued encouragement at conversation shows no signs of facial asymmetry, facial light touch sensation decreased on the right and splitting midline VIII: hearing normal bilaterally IX,X: gag reflex present XI: trapezius strength/neck flexion strength normal bilaterally XII: tongue strength normal  Motor: Right : Upper extremity   4/5 most attributable to give-way weakness   Left:     Upper extremity   5/5  Lower extremity   4+/5 most attributable to give-way weakness    Lower extremity   5-/5 Tone and bulk:normal tone throughout; no atrophy  noted Sensory: Pinprick and light touch decreased on the right splitting the midline on the trunk Deep Tendon Reflexes: 2+ in the upper extremities and absent in the lower extremities Plantars: Right: mute   Left: mute Cerebellar: normal finger-to-nose and normal heel-to-shin test  Results for orders placed during the hospital encounter of 06/17/12 (from the past 48 hour(s))  PROTIME-INR     Status: Normal   Collection Time   06/17/12 11:38 AM      Component Value Range Comment   Prothrombin Time 15.1  11.6 - 15.2 seconds    INR 1.17  0.00 - 1.49   APTT     Status: Normal   Collection Time   06/17/12 11:38 AM      Component Value Range Comment   aPTT 30  24 - 37 seconds   CBC     Status: Abnormal   Collection Time   06/17/12 11:38 AM      Component Value  Range Comment   WBC 3.3 (*) 4.0 - 10.5 K/uL    RBC 3.83 (*) 4.22 - 5.81 MIL/uL    Hemoglobin 11.5 (*) 13.0 - 17.0 g/dL    HCT 16.1 (*) 09.6 - 52.0 %    MCV 86.7  78.0 - 100.0 fL    MCH 30.0  26.0 - 34.0 pg    MCHC 34.6  30.0 - 36.0 g/dL    RDW 04.5  40.9 - 81.1 %    Platelets 113 (*) 150 - 400 K/uL PLATELET COUNT CONFIRMED BY SMEAR  DIFFERENTIAL     Status: Abnormal   Collection Time   06/17/12 11:38 AM      Component Value Range Comment   Neutrophils Relative 81 (*) 43 - 77 %    Neutro Abs 2.7  1.7 - 7.7 K/uL    Lymphocytes Relative 8 (*) 12 - 46 %    Lymphs Abs 0.3 (*) 0.7 - 4.0 K/uL    Monocytes Relative 9  3 - 12 %    Monocytes Absolute 0.3  0.1 - 1.0 K/uL    Eosinophils Relative 2  0 - 5 %    Eosinophils Absolute 0.1  0.0 - 0.7 K/uL    Basophils Relative 0  0 - 1 %    Basophils Absolute 0.0  0.0 - 0.1 K/uL   COMPREHENSIVE METABOLIC PANEL     Status: Abnormal   Collection Time   06/17/12 11:38 AM      Component Value Range Comment   Sodium 131 (*) 135 - 145 mEq/L    Potassium 4.1  3.5 - 5.1 mEq/L    Chloride 93 (*) 96 - 112 mEq/L    CO2 28  19 - 32 mEq/L    Glucose, Bld 219 (*) 70 - 99 mg/dL    BUN 33 (*) 6 - 23  mg/dL    Creatinine, Ser 9.14 (*) 0.50 - 1.35 mg/dL    Calcium 9.3  8.4 - 78.2 mg/dL    Total Protein 7.4  6.0 - 8.3 g/dL    Albumin 3.9  3.5 - 5.2 g/dL    AST 43 (*) 0 - 37 U/L    ALT 33  0 - 53 U/L    Alkaline Phosphatase 70  39 - 117 U/L    Total Bilirubin 0.7  0.3 - 1.2 mg/dL    GFR calc non Af Amer 29 (*) >90 mL/min    GFR calc Af Amer 34 (*) >90 mL/min   CK TOTAL AND CKMB     Status: Abnormal   Collection Time   06/17/12 11:38 AM      Component Value Range Comment   Total CK 147  7 - 232 U/L    CK, MB 4.3 (*) 0.3 - 4.0 ng/mL    Relative Index 2.9 (*) 0.0 - 2.5   TROPONIN I     Status: Normal   Collection Time   06/17/12 11:38 AM      Component Value Range Comment   Troponin I <0.30  <0.30 ng/mL   GLUCOSE, CAPILLARY     Status: Abnormal   Collection Time   06/17/12 11:45 AM      Component Value Range Comment   Glucose-Capillary 205 (*) 70 - 99 mg/dL   POCT I-STAT, CHEM 8     Status: Abnormal   Collection Time   06/17/12 11:56 AM      Component Value Range Comment   Sodium 134 (*) 135 - 145  mEq/L    Potassium 4.2  3.5 - 5.1 mEq/L    Chloride 97  96 - 112 mEq/L    BUN 35 (*) 6 - 23 mg/dL    Creatinine, Ser 4.09 (*) 0.50 - 1.35 mg/dL    Glucose, Bld 811 (*) 70 - 99 mg/dL    Calcium, Ion 9.14  7.82 - 1.23 mmol/L    TCO2 26  0 - 100 mmol/L    Hemoglobin 11.9 (*) 13.0 - 17.0 g/dL    HCT 95.6 (*) 21.3 - 52.0 %     No results found for this or any previous visit (from the past 240 hour(s)).  Ct Head Wo Contrast  06/17/2012  *RADIOLOGY REPORT*  Clinical Data: Code stroke  CT HEAD WITHOUT CONTRAST  Technique:  Contiguous axial images were obtained from the base of the skull through the vertex without contrast.  Comparison: 11/06/2010  Findings: There is no evidence for acute hemorrhage, hydrocephalus, mass lesion, or abnormal extra-axial fluid collection.  No definite CT evidence for acute infarction.  Patchy low attenuation in the deep hemispheric and periventricular white  matter is nonspecific, but likely reflects chronic microvascular ischemic demyelination. Right inferior cerebellar encephalomalacia is stable.  The visualized paranasal sinuses and mastoid air cells are clear.  IMPRESSION: Stable.  No acute intracranial abnormality.  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  I discussed these findings by telephone with Dr. Thad Ranger at approximately 1140 hours on 06/17/2012.  Original Report Authenticated By: ERIC A. MANSELL, M.D.     Assessment/Plan: 56 yo male presenting as a code stoke with complaints of right sided numbness and weakness.  Some of the patient's finding on exam appear to be functional but patient has multiple risk factors that inclue DM, HTN, hyperlipidemia, atrial fibrillation and stroke in the past.  Is on an aspirin a day at home for stroke prophylaxis.  CT shows no evidence of acute changes and MRI unable to be performed secondary to the patient's defibrillator.  1. HgbA1c, fasting lipid panel 2. PT consult, OT consult, Speech consult 3. Echocardiogram 4. Carotid dopplers 5.  Repeat head CT in 2-3 days 6. Prophylactic therapy-Continue ASA 7. Risk factor modification 8. Telemetry monitoring 9. Frequent neuro checks  Thana Farr, MD [facility] [phone] 06/17/2012, 12:45 PM

## 2012-06-17 NOTE — H&P (Signed)
[facility] History and Physical  [name] ZOX:[id number] DOB: 1956/06/08 DOA: 06/17/2012   PCP: Galvin Proffer, MD   Chief Complaint:  Chief Complaint  Patient presents with  . Code Stroke     HPI:  [name] is an 56 y.o. male with history of stroke, DM, HTN presenting with reports that yesterday before going to bed he felt weak. When he awakened in the middle of the night he felt nauseous and had some vomiting. Was stumbling as he attempted to go to the bathroom. When he awakened this morning felt numb and weak on the right side. Patient has deficits on the left secondary to an old infarct. EMS was called at that time and patient was brought in as a code stroke. She was seen in the ED by neurology and no tPa was given. He was referred for medical admission.    Review of Systems:  Positive for chronic pain and difficulty with ambulation - uses cane, positive for depression,  Patient also c/o chest wall pain at the site of a pacemaker.  All other systems reviewed and negative or as per HPI  Past Medical History  Diagnosis Date  . Diabetes mellitus   . Hypertension   . GERD (gastroesophageal reflux disease)   . Dyslipidemia   . Stroke   . CHF (congestive heart failure)   . Atrial fibrillation   . Asthma   . Pneumonia   . Chronic kidney disease, stage 3, mod decreased GFR    Past Surgical History  Procedure Date  . Internal pacemaker    Social History:  reports that he has never smoked. He does not have any smokeless tobacco history on file. He reports that he does not drink alcohol or use illicit drugs. Patient lives alone   Allergies  Allergen Reactions  . Cephalosporins Nausea Only  . Morphine And Related Other (See Comments)    hypotension  . Penicillins Other (See Comments)    Reaction as a child -unknown    Family History  Problem Relation Age of Onset  . Hypertension Mother   . Diabetes Mother   . Diabetes Brother   . Hypertension  Brother     Prior to Admission medications   Medication Sig Start Date End Date Taking? Authorizing Provider  amiodarone (PACERONE) 200 MG tablet Take 200 mg by mouth 2 (two) times daily.     Yes Historical Provider, MD  carvedilol (COREG) 6.25 MG tablet Take 9.375 mg by mouth 2 (two) times daily with a meal.     Yes Historical Provider, MD  colchicine 0.6 MG tablet Take 0.6 mg by mouth daily.   Yes Historical Provider, MD  donepezil (ARICEPT) 5 MG tablet Take 5 mg by mouth at bedtime.   Yes Historical Provider, MD  febuxostat (ULORIC) 40 MG tablet Take 40 mg by mouth daily.   Yes Historical Provider, MD  fenofibrate (TRICOR) 145 MG tablet Take 145 mg by mouth daily.   Yes Historical Provider, MD  furosemide (LASIX) 40 MG tablet Take 40 mg by mouth 2 (two) times daily.     Yes Historical Provider, MD  gabapentin (NEURONTIN) 300 MG capsule Take 300 mg by mouth 3 (three) times daily.     Yes Historical Provider, MD  glipiZIDE (GLUCOTROL) 5 MG tablet Take 5 mg by mouth 2 (two) times daily before a meal.   Yes Historical Provider, MD  hydrALAZINE (APRESOLINE) 25 MG tablet Take 25 mg by mouth 3 (three) times  daily.     Yes Historical Provider, MD  HYDROcodone-acetaminophen (VICODIN) 5-500 MG per tablet Take 1 tablet by mouth at bedtime.   Yes Historical Provider, MD  insulin aspart (NOVOLOG) 100 UNIT/ML injection Inject 1-12 Units into the skin 3 (three) times daily before meals.   Yes Historical Provider, MD  insulin glargine (LANTUS) 100 UNIT/ML injection Inject 45 Units into the skin daily.    Yes Historical Provider, MD  isosorbide mononitrate (IMDUR) 60 MG 24 hr tablet Take 90 mg by mouth at bedtime.     Yes Historical Provider, MD  linezolid (ZYVOX) 600 MG tablet Take 600 mg by mouth 2 (two) times daily.   Yes Historical Provider, MD  magnesium oxide (MAG-OX) 400 MG tablet Take 400 mg by mouth 2 (two) times daily.   Yes Historical Provider, MD  omeprazole (PRILOSEC) 20 MG capsule Take 40 mg by  mouth 2 (two) times daily.   Yes Historical Provider, MD  spironolactone (ALDACTONE) 25 MG tablet Take 25 mg by mouth 2 (two) times daily.   Yes Historical Provider, MD  Tamsulosin HCl (FLOMAX) 0.4 MG CAPS Take 0.4 mg by mouth daily.   Yes Historical Provider, MD  Ticagrelor (BRILINTA) 90 MG TABS tablet Take 90 mg by mouth 2 (two) times daily.    Yes Historical Provider, MD  vitamin B-12 (CYANOCOBALAMIN) 250 MCG tablet Take 250 mcg by mouth daily.   Yes Historical Provider, MD  nitroGLYCERIN (NITROSTAT) 0.4 MG SL tablet Place 0.4 mg under the tongue every 5 (five) minutes as needed. For chest pain     Historical Provider, MD   Physical Exam: Filed Vitals:   06/18/12 0045 06/18/12 0218 06/18/12 0422 06/18/12 0620  BP: 113/61 115/56 124/76 110/62  Pulse: 70 70 70 74  Temp: 98.3 F (36.8 C) 98 F (36.7 C) 98 F (36.7 C) 98.3 F (36.8 C)  TempSrc: Oral Oral Oral Oral  Resp: 20 20 18 20   SpO2: 100% 100% 100% 96%     General:  Alert and oriented x3  Eyes: has lid lag, also has left eye deviated to the midline  ENT: normal ext ears, clear pahrynx  Neck: no JVD  Cardiovascular: RRR, no M, R,G  Respiratory: left upper chest has a mass - represented by pacemaker - ? Migrated to the left , lungs are clear   Abdomen: soft, nt, BS present  Skin: pale, dry, no rashes   Musculoskeletal: intact   Psychiatric: depressed, speaks in a soft voice Neurologic:  Cranial Nerves: 2-12 intact,  l Motor: Right :4/5 - mainly effort related left 5/5   Labs on Admission:  Basic Metabolic Panel:  Lab 06/17/12 1610 06/17/12 1138  NA 134* 131*  K 4.2 4.1  CL 97 93*  CO2 -- 28  GLUCOSE 213* 219*  BUN 35* 33*  CREATININE 2.20* 2.36*  CALCIUM -- 9.3  MG -- --  PHOS -- --   Liver Function Tests:  Lab 06/17/12 1138  AST 43*  ALT 33  ALKPHOS 70  BILITOT 0.7  PROT 7.4  ALBUMIN 3.9   No results found for this basename: LIPASE:5,AMYLASE:5 in the last 168 hours No results found for this  basename: AMMONIA:5 in the last 168 hours CBC:  Lab 06/17/12 1156 06/17/12 1138  WBC -- 3.3*  NEUTROABS -- 2.7  HGB 11.9* 11.5*  HCT 35.0* 33.2*  MCV -- 86.7  PLT -- 113*   Cardiac Enzymes:  Lab 06/17/12 1138  CKTOTAL 147  CKMB 4.3*  CKMBINDEX --  TROPONINI <0.30    BNP (last 3 results)  Basename 09/30/11 1419  PROBNP 503.4*   CBG:  Lab 06/18/12 0043 06/17/12 2139 06/17/12 1616 06/17/12 1145  GLUCAP 86 70 114* 205*    Radiological Exams on Admission: Ct Head Wo Contrast  06/17/2012  *RADIOLOGY REPORT*  Clinical Data: Code stroke  CT HEAD WITHOUT CONTRAST  Technique:  Contiguous axial images were obtained from the base of the skull through the vertex without contrast.  Comparison: 11/06/2010  Findings: There is no evidence for acute hemorrhage, hydrocephalus, mass lesion, or abnormal extra-axial fluid collection.  No definite CT evidence for acute infarction.  Patchy low attenuation in the deep hemispheric and periventricular white matter is nonspecific, but likely reflects chronic microvascular ischemic demyelination. Right inferior cerebellar encephalomalacia is stable.  The visualized paranasal sinuses and mastoid air cells are clear.  IMPRESSION: Stable.  No acute intracranial abnormality.  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  I discussed these findings by telephone with Dr. Thad Ranger at approximately 1140 hours on 06/17/2012.  Original Report Authenticated By: ERIC A. MANSELL, M.D.      Assessment/Plan Principal Problem:  *Hemiplegia, unspecified, affecting dominant side Active Problems:  Acute right-sided weakness  Atrial fibrillation  CHF (congestive heart failure)  Dyslipidemia  Hypertension  Chronic kidney disease, stage 3, mod decreased GFR  Vomiting  Diarrhea  S/P placement of cardiac pacemaker   1. Hemiplegia - ? Stroke vs functional vs due to hypoglycemia - plan to observe overnight and monitor  closely CBG once insulin resumed and patient eating. Needs PT/OT eval . No MRI due to presence of pacemaker. 2. S/p pacemaker - consulted cards due to patient concern about pacemaker pocket  3. CKD stage 3 - at baseline 4. CAD s/p multiple stents on Brilinta , no symptoms of acute ischemia    [name] [facility] Pager [phone]  If 7PM-7AM, please contact night-coverage [email or web address] Password [facility] 06/18/2012, 6:36 AM

## 2012-06-17 NOTE — ED Notes (Signed)
CBG 114 

## 2012-06-17 NOTE — ED Provider Notes (Addendum)
History     CSN: [id number]  Arrival date & time 06/17/12  1119   First MD Initiated Contact with Patient 06/17/12 1139      No chief complaint on file.   (Consider location/radiation/quality/duration/timing/severity/associated sxs/prior treatment) Patient is a 56 y.o. male presenting with weakness. The history is provided by the patient and the EMS personnel.  Weakness The primary symptoms include headaches, paresthesias, focal weakness, nausea and vomiting. Primary symptoms do not include loss of consciousness or fever. The symptoms began 6 to 12 hours ago. Episode duration: Waxing and waning but symptoms were occurring at 4 AM. The symptoms are waxing and waning. The neurological symptoms are focal.  The headache is associated with paresthesias and weakness. The headache is not associated with loss of balance.  Affected locations include the: right side of the face, right shoulder, right forearm and right distal leg.  There is weakness in these regions/motions: facial muscles. There is impairment of the following actions: articulating words.  The vomiting began today. Vomiting occurs 2 to 5 times per day. The emesis contains stomach contents.  Additional symptoms include weakness. Additional symptoms do not include loss of balance. Medical issues also include cerebral vascular accident. Medical issues do not include seizures.    Past Medical History  Diagnosis Date  . Diabetes mellitus   . Hypertension     Past Surgical History  Procedure Date  . Internal pacemaker     No family history on file.  History  Substance Use Topics  . Smoking status: Never Smoker   . Smokeless tobacco: Not on file  . Alcohol Use: No      Review of Systems  Constitutional: Negative for fever.  Respiratory: Negative for cough and shortness of breath.   Cardiovascular: Negative for chest pain and leg swelling.  Gastrointestinal: Positive for nausea and vomiting.  Neurological: Positive for  focal weakness, weakness, headaches and paresthesias. Negative for loss of consciousness and loss of balance.  All other systems reviewed and are negative.    Allergies  Cephalosporins; Morphine and related; and Penicillins  Home Medications   Current Outpatient Rx  Name Route Sig Dispense Refill  . AMIODARONE HCL 200 MG PO TABS Oral Take 200 mg by mouth 2 (two) times daily.      . ASPIRIN EC 81 MG PO TBEC Oral Take 81 mg by mouth daily.      Marland Kitchen CARVEDILOL 6.25 MG PO TABS Oral Take 9.375 mg by mouth 2 (two) times daily with a meal.      . VITAMIN D 1000 UNITS PO TABS Oral Take 1,000 Units by mouth daily.    Marland Kitchen CINNAMON 500 MG PO CAPS Oral Take 1,000 mg by mouth 2 (two) times daily.      Marland Kitchen DOXYCYCLINE HYCLATE 100 MG PO TABS Oral Take 100 mg by mouth 2 (two) times daily.    . FENOFIBRATE 145 MG PO TABS Oral Take 145 mg by mouth daily.    . FUROSEMIDE 40 MG PO TABS Oral Take 40 mg by mouth 2 (two) times daily.      Marland Kitchen GABAPENTIN 300 MG PO CAPS Oral Take 300 mg by mouth 3 (three) times daily.      Marland Kitchen GLIPIZIDE 5 MG PO TABS Oral Take 5 mg by mouth 2 (two) times daily before a meal.    . HYDRALAZINE HCL 25 MG PO TABS Oral Take 25 mg by mouth 3 (three) times daily.      . INSULIN ASPART 100  UNIT/ML [place] SOLN Subcutaneous Inject 1-12 Units into the skin 3 (three) times daily before meals.    . INSULIN GLARGINE 100 UNIT/ML  SOLN Subcutaneous Inject 55 Units into the skin daily.     . ISOSORBIDE MONONITRATE ER 60 MG PO TB24 Oral Take 90 mg by mouth at bedtime.      Marland Kitchen MAGNESIUM 30 MG PO TABS Oral Take 30 mg by mouth 2 (two) times daily.    Marland Kitchen NITROGLYCERIN 0.4 MG SL SUBL Sublingual Place 0.4 mg under the tongue every 5 (five) minutes as needed. For chest pain     . OMEPRAZOLE 20 MG PO CPDR Oral Take 40 mg by mouth 2 (two) times daily.    Marland Kitchen RIVASTIGMINE 4.6 MG/24HR TD PT24 Transdermal Place 1 patch onto the skin daily.    Marland Kitchen SPIRONOLACTONE 25 MG PO TABS Oral Take 25 mg by mouth 2 (two) times daily.    Marland Kitchen  TICAGRELOR 90 MG PO TABS Oral Take 90 mg by mouth daily.    Marland Kitchen VITAMIN B-12 1000 MCG PO TABS Oral Take 1,000 mcg by mouth daily.      There were no vitals taken for this visit.  Physical Exam  Nursing note and vitals reviewed. Constitutional: He is oriented to person, place, and time. He appears well-developed and well-nourished. No distress.  HENT:  Head: Normocephalic and atraumatic.  Mouth/Throat: Oropharynx is clear and moist.  Eyes: Conjunctivae and EOM are normal. Pupils are equal, round, and reactive to light.  Neck: Normal range of motion. Neck supple.  Cardiovascular: Normal rate, regular rhythm and intact distal pulses.   No murmur heard. Pulmonary/Chest: Effort normal and breath sounds normal. No respiratory distress. He has no wheezes. He has no rales.  Abdominal: Soft. He exhibits no distension. There is no tenderness. There is no rebound and no guarding.  Musculoskeletal: Normal range of motion. He exhibits no edema and no tenderness.  Neurological: He is alert and oriented to person, place, and time. A cranial nerve deficit and sensory deficit is present.       Weakness in bilateral right and left upper and lower extremities.  Decreased sensation over the right side of the face, upper and lower extremity.  Difficulty with finger to nose with pass pointing with both fingers  Skin: Skin is warm and dry. No rash noted. No erythema.  Psychiatric: He has a normal mood and affect. His behavior is normal.    ED Course  Procedures (including critical care time)  Labs Reviewed  CBC - Abnormal; Notable for the following:    WBC 3.3 (*)     RBC 3.83 (*)     Hemoglobin 11.5 (*)     HCT 33.2 (*)     Platelets 113 (*)  PLATELET COUNT CONFIRMED BY SMEAR   All other components within normal limits  DIFFERENTIAL - Abnormal; Notable for the following:    Neutrophils Relative 81 (*)     Lymphocytes Relative 8 (*)     Lymphs Abs 0.3 (*)     All other components within normal limits    COMPREHENSIVE METABOLIC PANEL - Abnormal; Notable for the following:    Sodium 131 (*)     Chloride 93 (*)     Glucose, Bld 219 (*)     BUN 33 (*)     Creatinine, Ser 2.36 (*)     AST 43 (*)     GFR calc non Af Amer 29 (*)     GFR calc  Af Amer 34 (*)     All other components within normal limits  CK TOTAL AND CKMB - Abnormal; Notable for the following:    CK, MB 4.3 (*)     Relative Index 2.9 (*)     All other components within normal limits  GLUCOSE, CAPILLARY - Abnormal; Notable for the following:    Glucose-Capillary 205 (*)     All other components within normal limits  POCT I-STAT, CHEM 8 - Abnormal; Notable for the following:    Sodium 134 (*)     BUN 35 (*)     Creatinine, Ser 2.20 (*)     Glucose, Bld 213 (*)     Hemoglobin 11.9 (*)     HCT 35.0 (*)     All other components within normal limits  PROTIME-INR  APTT  TROPONIN I  URINE RAPID DRUG SCREEN (HOSP PERFORMED)  URINALYSIS, ROUTINE W REFLEX MICROSCOPIC   Ct Head Wo Contrast  06/17/2012  *RADIOLOGY REPORT*  Clinical Data: Code stroke  CT HEAD WITHOUT CONTRAST  Technique:  Contiguous axial images were obtained from the base of the skull through the vertex without contrast.  Comparison: 11/06/2010  Findings: There is no evidence for acute hemorrhage, hydrocephalus, mass lesion, or abnormal extra-axial fluid collection.  No definite CT evidence for acute infarction.  Patchy low attenuation in the deep hemispheric and periventricular white matter is nonspecific, but likely reflects chronic microvascular ischemic demyelination. Right inferior cerebellar encephalomalacia is stable.  The visualized paranasal sinuses and mastoid air cells are clear.  IMPRESSION: Stable.  No acute intracranial abnormality.  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  I discussed these findings by telephone with Dr. Thad Ranger at approximately 1140 hours on 06/17/2012.  Original Report Authenticated By:  ERIC A. MANSELL, M.D.    Date: 06/17/2012  Rate: 70  Rhythm: Paced rhythm  QRS Axis: indeterminate  Intervals: Paced rhythm  ST/T Wave abnormalities: Paced rhythm  Conduction Disutrbances:nonspecific intraventricular conduction delay  Narrative Interpretation:   Old EKG Reviewed: unchanged    1. Hemiplegia, unspecified, affecting dominant side       MDM   Patient presented as a code stroke the complaint of left-sided temporal headache as well as right-sided weakness, facial droop and speech difficulty. Patient reported initially as 3 hours prior to arrival. However on further investigating patient states that he woke up about 4 AM today and had several episodes of vomiting and was not feeling well and having some difficulty walking. Patient has a history of her prior left-sided stroke. Stroke team was at bedside they given his unclear last seen normal he is not a TPA candidate. The code stroke was canceled. Feel that he will need admission for further evaluation. Patient denies any abdominal or chest pain but states he still feels nauseated.   EKG shows an unchanged paced rhythm.  Head CT showed no acute findings. CBC, CMP, cardiac enzymes, UA, chest x-ray pending.  12:44 PM Labs unrevealing with stable renal insufficiency, negative cardiac enzymes and normal CBC. Will admit patient for stroke rule out.      Gwyneth Sprout, MD 06/17/12 1245  Gwyneth Sprout, MD 06/17/12 1343

## 2012-06-18 DIAGNOSIS — I255 Ischemic cardiomyopathy: Secondary | ICD-10-CM | POA: Diagnosis present

## 2012-06-18 DIAGNOSIS — I472 Ventricular tachycardia: Secondary | ICD-10-CM | POA: Diagnosis present

## 2012-06-18 DIAGNOSIS — E1169 Type 2 diabetes mellitus with other specified complication: Secondary | ICD-10-CM

## 2012-06-18 DIAGNOSIS — I2589 Other forms of chronic ischemic heart disease: Secondary | ICD-10-CM

## 2012-06-18 DIAGNOSIS — E11649 Type 2 diabetes mellitus with hypoglycemia without coma: Secondary | ICD-10-CM | POA: Diagnosis present

## 2012-06-18 DIAGNOSIS — T827XXA Infection and inflammatory reaction due to other cardiac and vascular devices, implants and grafts, initial encounter: Secondary | ICD-10-CM | POA: Diagnosis present

## 2012-06-18 LAB — GLUCOSE, CAPILLARY
Glucose-Capillary: 127 mg/dL — ABNORMAL HIGH (ref 70–99)
Glucose-Capillary: 275 mg/dL — ABNORMAL HIGH (ref 70–99)

## 2012-06-18 LAB — LIPID PANEL
HDL: 26 mg/dL — ABNORMAL LOW (ref >39)
Total CHOL/HDL Ratio: 3.6 RATIO
VLDL: 46 mg/dL — ABNORMAL HIGH (ref 0–40)

## 2012-06-18 LAB — HEMOGLOBIN A1C: Mean Plasma Glucose: 169 mg/dL — ABNORMAL HIGH (ref <117)

## 2012-06-18 MED ORDER — INSULIN ASPART 100 UNIT/ML ~~LOC~~ SOLN: 0.0000 [IU] | Freq: Every day | SUBCUTANEOUS | Status: DC

## 2012-06-18 MED ORDER — INSULIN ASPART 100 UNIT/ML ~~LOC~~ SOLN
0.0000 [IU] | Freq: Three times a day (TID) | SUBCUTANEOUS | Status: DC
  Administered 2012-06-18: 2 [IU] via SUBCUTANEOUS
  Administered 2012-06-18: 3 [IU] via SUBCUTANEOUS
  Administered 2012-06-19 (×2): 2 [IU] via SUBCUTANEOUS
  Administered 2012-06-19: 1 [IU] via SUBCUTANEOUS
  Administered 2012-06-20: 2 [IU] via SUBCUTANEOUS

## 2012-06-18 MED ORDER — INSULIN ASPART 100 UNIT/ML ~~LOC~~ SOLN
4.0000 [IU] | Freq: Three times a day (TID) | SUBCUTANEOUS | Status: DC
  Administered 2012-06-18 – 2012-06-20 (×6): 4 [IU] via SUBCUTANEOUS

## 2012-06-18 MED ORDER — BIOTENE DRY MOUTH MT LIQD
15.0000 mL | Freq: Two times a day (BID) | OROMUCOSAL | Status: DC
  Administered 2012-06-18 – 2012-06-19 (×4): 15 mL via OROMUCOSAL

## 2012-06-18 MED ORDER — INSULIN GLARGINE 100 UNIT/ML ~~LOC~~ SOLN
20.0000 [IU] | Freq: Every day | SUBCUTANEOUS | Status: DC
  Administered 2012-06-18: 20 [IU] via SUBCUTANEOUS
  Administered 2012-06-19: 23:00:00 via SUBCUTANEOUS

## 2012-06-18 NOTE — Progress Notes (Addendum)
[facility] PROGRESS NOTE  [name]:[id number] DOB: 1956/04/16 DOA: 06/17/2012 PCP: Galvin Proffer, MD  Assessment/Plan: Principal Problem:  *Hemiplegia, unspecified, affecting dominant side Active Problems:  Acute right-sided weakness  Atrial fibrillation  Dyslipidemia  Hypertension  Chronic kidney disease, stage 3, mod decreased GFR  Vomiting  Diarrhea  S/P placement of cardiac pacemaker  Hypoglycemia associated with diabetes  ICD (implantable cardiac defibrillator) infection  Ventricular tachyarrhythmia  Ischemic cardiomyopathy  Systolic CHF, chronic  1. Hypoglycemia due to sulfonylurea and probably lantus - suspect may be the cause for subjective weakness on the right side. - DC glipizide and we do NOT plan to resume at discharge - due to decrease GFR. Reduced dose of lantus at 20 QHS for now 2. S/p remote pacemaker placement - patient c/o pacer pocket being swollen - defer to cards 3. Still completing the stroke w/u per neuro  4. Chronic systolic CHF/CAD/HTN - continue Brilinta . C/w BB, Imdur, Hydralazine. Not an ACEI candidate due to renal failure. Stopped aldactone due to renal failure.   5. Could go home tomorrow if no new events  Code Status: full Family Communication: patient is alone Disposition Plan: ? home   Consultants:  Neurology  Procedures:  Ct head   HPI/Subjective: Feels better   Objective: Filed Vitals:   06/18/12 0218 06/18/12 0422 06/18/12 0620 06/18/12 1112  BP: 115/56 124/76 110/62 115/60  Pulse: 70 70 74 77  Temp: 98 F (36.7 C) 98 F (36.7 C) 98.3 F (36.8 C) 98.3 F (36.8 C)  TempSrc: Oral Oral Oral   Resp: 20 18 20 20   SpO2: 100% 100% 96% 98%    Intake/Output Summary (Last 24 hours) at 06/18/12 1703 Last data filed at 06/18/12 1407  Gross per 24 hour  Intake    300 ml  Output    850 ml  Net   -550 ml    Exam:   General:  axox3  Cardiovascular: RRR  Respiratory: CTAB  Abdomen: soft, NT  Data  Reviewed: Basic Metabolic Panel:  Lab 06/17/12 4401 06/17/12 1138  NA 134* 131*  K 4.2 4.1  CL 97 93*  CO2 -- 28  GLUCOSE 213* 219*  BUN 35* 33*  CREATININE 2.20* 2.36*  CALCIUM -- 9.3  MG -- --  PHOS -- --   Liver Function Tests:  Lab 06/17/12 1138  AST 43*  ALT 33  ALKPHOS 70  BILITOT 0.7  PROT 7.4  ALBUMIN 3.9   No results found for this basename: LIPASE:5,AMYLASE:5 in the last 168 hours No results found for this basename: AMMONIA:5 in the last 168 hours CBC:  Lab 06/17/12 1156 06/17/12 1138  WBC -- 3.3*  NEUTROABS -- 2.7  HGB 11.9* 11.5*  HCT 35.0* 33.2*  MCV -- 86.7  PLT -- 113*   Cardiac Enzymes:  Lab 06/17/12 1138  CKTOTAL 147  CKMB 4.3*  CKMBINDEX --  TROPONINI <0.30   BNP (last 3 results)  Basename 09/30/11 1419  PROBNP 503.4*   CBG:  Lab 06/18/12 1632 06/18/12 1227 06/18/12 0757 06/18/12 0425 06/18/12 0043  GLUCAP 153* 275* 127* 101* 86    No results found for this or any previous visit (from the past 240 hour(s)).   Studies: Ct Head Wo Contrast  06/17/2012  *RADIOLOGY REPORT*  Clinical Data: Code stroke  CT HEAD WITHOUT CONTRAST  Technique:  Contiguous axial images were obtained from the base of the skull through the vertex without contrast.  Comparison: 11/06/2010  Findings: There is  no evidence for acute hemorrhage, hydrocephalus, mass lesion, or abnormal extra-axial fluid collection.  No definite CT evidence for acute infarction.  Patchy low attenuation in the deep hemispheric and periventricular white matter is nonspecific, but likely reflects chronic microvascular ischemic demyelination. Right inferior cerebellar encephalomalacia is stable.  The visualized paranasal sinuses and mastoid air cells are clear.  IMPRESSION: Stable.  No acute intracranial abnormality.  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  I discussed these findings by telephone with Dr. Thad Ranger at approximately 1140 hours on  06/17/2012.  Original Report Authenticated By: ERIC A. MANSELL, M.D.    Scheduled Meds:    . amiodarone  200 mg Oral BID  . antiseptic oral rinse  15 mL Mouth Rinse q12n4p  . carvedilol  9.375 mg Oral BID WC  . colchicine  0.6 mg Oral Daily  . donepezil  5 mg Oral QHS  . enoxaparin (LOVENOX) injection  40 mg Subcutaneous Q24H  . febuxostat  40 mg Oral Daily  . fenofibrate  160 mg Oral Daily  . gabapentin  300 mg Oral TID  . hydrALAZINE  25 mg Oral TID  . HYDROcodone-acetaminophen  1 tablet Oral QHS  . insulin aspart  0-5 Units Subcutaneous QHS  . insulin aspart  0-9 Units Subcutaneous TID WC  . insulin aspart  4 Units Subcutaneous TID WC  . insulin glargine  20 Units Subcutaneous QHS  . isosorbide mononitrate  90 mg Oral QHS  . magnesium oxide  400 mg Oral BID  . pantoprazole  40 mg Oral Q1200  . Tamsulosin HCl  0.4 mg Oral Daily  . Ticagrelor  90 mg Oral BID  . vitamin B-12  250 mcg Oral Daily  . DISCONTD: insulin aspart  0-9 Units Subcutaneous TID WC   Continuous Infusions:    . DISCONTD: sodium chloride 50 mL/hr (06/17/12 1438)  . DISCONTD: dextrose 5 % and 0.9% NaCl 75 mL/hr at 06/17/12 2205       [facility]  [facility] Pager [phone]. If 8PM-8AM, please contact night-coverage at [email or web address], password [facility] 06/18/2012, 5:03 PM  LOS: 1 day

## 2012-06-18 NOTE — Consult Note (Signed)
Reason for Consult: Painful Pacer pocket Referring Physician: [name] is an 56 y.o. male.  HPI:    The patient is a 56 year old obese Caucasian male with history of insulin-dependent diabetes, hyperlipidemia, hypertension, chronic kidney disease-stage III, atrial fibrillation, stroke, asthma, severe coronary disease, peripheral neuropathy, ischemic cardiomyopathy with left bundle branch block. He is status post by the ICD which was implanted in April 2006. His hospitalized in the past for having multiple ICD shocks. Ejection fraction is improved to 35-40%. In January 2011 patient is a new RV lead implanted with balloon dilatation of the subclavian vein at ERI. This was completed by Dr. Sharol Harness at [facility]. Patient initially had a hematoma resolved. Patient was most recently seen by Dr. Alanda Amass on 05/23/2012. #Patient was complaining that felt like his ICD had shifted to the left. Apparently he's had several falls. He, at that time and currently, is complaining of tenderness on the lateral edge of the ICD in the left left axilla.  Patient presented yesterday with right-sided weakness. He stated that over 400 hours usually morning he became nauseous with vomiting as well as diaphoresis weak and dizzy with blurred vision. He checked his sugar at that time which was 219. But he became extremely weak in the right side in his legs and arm. He was subsequently brought in by EMS as a code stroke.  He denies chest pain, shortness of breath, fever, abdominal pain, palpitations, hematochezia, melena, dysuria, hematuria, lower extremity edema, cough, congestion.  The patient has had ongoing complaints of concerns about his pacer site for several months. He states that the most recently had a boil come up on the lateral edge of this pacer scar he was given antibiotics by Gwenlyn Fudge in [employer] and will have subsequently resolved. He still complains of extreme tenderness at the lateral edge.  Past  Medical History  Diagnosis Date  . Diabetes mellitus   . Hypertension   . GERD (gastroesophageal reflux disease)   . Dyslipidemia   . Stroke   . CHF (congestive heart failure)   . Atrial fibrillation   . Asthma   . Pneumonia   . Chronic kidney disease, stage 3, mod decreased GFR     Past Surgical History  Procedure Date  . Internal pacemaker     Family History  Problem Relation Age of Onset  . Hypertension Mother   . Diabetes Mother   . Diabetes Brother   . Hypertension Brother     Social History:  reports that he has never smoked. He does not have any smokeless tobacco history on file. He reports that he does not drink alcohol or use illicit drugs.  Allergies:  Allergies  Allergen Reactions  . Cephalosporins Nausea Only  . Morphine And Related Other (See Comments)    hypotension  . Penicillins Other (See Comments)    Reaction as a child -unknown    Medications:    . amiodarone  200 mg Oral BID  . antiseptic oral rinse  15 mL Mouth Rinse q12n4p  . carvedilol  9.375 mg Oral BID WC  . colchicine  0.6 mg Oral Daily  . donepezil  5 mg Oral QHS  . enoxaparin (LOVENOX) injection  40 mg Subcutaneous Q24H  . febuxostat  40 mg Oral Daily  . fenofibrate  160 mg Oral Daily  . gabapentin  300 mg Oral TID  . hydrALAZINE  25 mg Oral TID  . HYDROcodone-acetaminophen  1 tablet Oral QHS  . insulin aspart  0-5 Units Subcutaneous QHS  . insulin aspart  0-9 Units Subcutaneous TID WC  . insulin aspart  4 Units Subcutaneous TID WC  . insulin glargine  20 Units Subcutaneous QHS  . isosorbide mononitrate  90 mg Oral QHS  . magnesium oxide  400 mg Oral BID  . pantoprazole  40 mg Oral Q1200  . sodium chloride  250 mL Intravenous Once  . Tamsulosin HCl  0.4 mg Oral Daily  . Ticagrelor  90 mg Oral BID  . vitamin B-12  250 mcg Oral Daily  . DISCONTD: insulin aspart  0-9 Units Subcutaneous TID WC  . DISCONTD: magnesium oxide  400 mg Oral BID  . DISCONTD: vitamin B-12  250 mcg Oral  Daily     Results for orders placed during the hospital encounter of 06/17/12 (from the past 48 hour(s))  PROTIME-INR     Status: Normal   Collection Time   06/17/12 11:38 AM      Component Value Range Comment   Prothrombin Time 15.1  11.6 - 15.2 seconds    INR 1.17  0.00 - 1.49   APTT     Status: Normal   Collection Time   06/17/12 11:38 AM      Component Value Range Comment   aPTT 30  24 - 37 seconds   CBC     Status: Abnormal   Collection Time   06/17/12 11:38 AM      Component Value Range Comment   WBC 3.3 (*) 4.0 - 10.5 K/uL    RBC 3.83 (*) 4.22 - 5.81 MIL/uL    Hemoglobin 11.5 (*) 13.0 - 17.0 g/dL    HCT 84.1 (*) 32.4 - 52.0 %    MCV 86.7  78.0 - 100.0 fL    MCH 30.0  26.0 - 34.0 pg    MCHC 34.6  30.0 - 36.0 g/dL    RDW 40.1  02.7 - 25.3 %    Platelets 113 (*) 150 - 400 K/uL PLATELET COUNT CONFIRMED BY SMEAR  DIFFERENTIAL     Status: Abnormal   Collection Time   06/17/12 11:38 AM      Component Value Range Comment   Neutrophils Relative 81 (*) 43 - 77 %    Neutro Abs 2.7  1.7 - 7.7 K/uL    Lymphocytes Relative 8 (*) 12 - 46 %    Lymphs Abs 0.3 (*) 0.7 - 4.0 K/uL    Monocytes Relative 9  3 - 12 %    Monocytes Absolute 0.3  0.1 - 1.0 K/uL    Eosinophils Relative 2  0 - 5 %    Eosinophils Absolute 0.1  0.0 - 0.7 K/uL    Basophils Relative 0  0 - 1 %    Basophils Absolute 0.0  0.0 - 0.1 K/uL   COMPREHENSIVE METABOLIC PANEL     Status: Abnormal   Collection Time   06/17/12 11:38 AM      Component Value Range Comment   Sodium 131 (*) 135 - 145 mEq/L    Potassium 4.1  3.5 - 5.1 mEq/L    Chloride 93 (*) 96 - 112 mEq/L    CO2 28  19 - 32 mEq/L    Glucose, Bld 219 (*) 70 - 99 mg/dL    BUN 33 (*) 6 - 23 mg/dL    Creatinine, Ser 6.64 (*) 0.50 - 1.35 mg/dL    Calcium 9.3  8.4 - 40.3 mg/dL    Total Protein 7.4  6.0 -  8.3 g/dL    Albumin 3.9  3.5 - 5.2 g/dL    AST 43 (*) 0 - 37 U/L    ALT 33  0 - 53 U/L    Alkaline Phosphatase 70  39 - 117 U/L    Total Bilirubin 0.7  0.3  - 1.2 mg/dL    GFR calc non Af Amer 29 (*) >90 mL/min    GFR calc Af Amer 34 (*) >90 mL/min   CK TOTAL AND CKMB     Status: Abnormal   Collection Time   06/17/12 11:38 AM      Component Value Range Comment   Total CK 147  7 - 232 U/L    CK, MB 4.3 (*) 0.3 - 4.0 ng/mL    Relative Index 2.9 (*) 0.0 - 2.5   TROPONIN I     Status: Normal   Collection Time   06/17/12 11:38 AM      Component Value Range Comment   Troponin I <0.30  <0.30 ng/mL   GLUCOSE, CAPILLARY     Status: Abnormal   Collection Time   06/17/12 11:45 AM      Component Value Range Comment   Glucose-Capillary 205 (*) 70 - 99 mg/dL   POCT I-STAT, CHEM 8     Status: Abnormal   Collection Time   06/17/12 11:56 AM      Component Value Range Comment   Sodium 134 (*) 135 - 145 mEq/L    Potassium 4.2  3.5 - 5.1 mEq/L    Chloride 97  96 - 112 mEq/L    BUN 35 (*) 6 - 23 mg/dL    Creatinine, Ser 1.61 (*) 0.50 - 1.35 mg/dL    Glucose, Bld 096 (*) 70 - 99 mg/dL    Calcium, Ion 0.45  4.09 - 1.23 mmol/L    TCO2 26  0 - 100 mmol/L    Hemoglobin 11.9 (*) 13.0 - 17.0 g/dL    HCT 81.1 (*) 91.4 - 52.0 %   URINE RAPID DRUG SCREEN (HOSP PERFORMED)     Status: Abnormal   Collection Time   06/17/12  1:21 PM      Component Value Range Comment   Opiates POSITIVE (*) NONE DETECTED    Cocaine NONE DETECTED  NONE DETECTED    Benzodiazepines NONE DETECTED  NONE DETECTED    Amphetamines NONE DETECTED  NONE DETECTED    Tetrahydrocannabinol NONE DETECTED  NONE DETECTED    Barbiturates NONE DETECTED  NONE DETECTED   URINALYSIS, ROUTINE W REFLEX MICROSCOPIC     Status: Abnormal   Collection Time   06/17/12  1:21 PM      Component Value Range Comment   Color, Urine YELLOW  YELLOW    APPearance CLEAR  CLEAR    Specific Gravity, Urine 1.016  1.005 - 1.030    pH 5.5  5.0 - 8.0    Glucose, UA 100 (*) NEGATIVE mg/dL    Hgb urine dipstick NEGATIVE  NEGATIVE    Bilirubin Urine NEGATIVE  NEGATIVE    Ketones, ur NEGATIVE  NEGATIVE mg/dL    Protein,  ur NEGATIVE  NEGATIVE mg/dL    Urobilinogen, UA 1.0  0.0 - 1.0 mg/dL    Nitrite NEGATIVE  NEGATIVE    Leukocytes, UA NEGATIVE  NEGATIVE MICROSCOPIC NOT DONE ON URINES WITH NEGATIVE PROTEIN, BLOOD, LEUKOCYTES, NITRITE, OR GLUCOSE <1000 mg/dL.  GLUCOSE, CAPILLARY     Status: Abnormal   Collection Time   06/17/12  4:16 PM  Component Value Range Comment   Glucose-Capillary 114 (*) 70 - 99 mg/dL    Comment 1 Documented in Chart     GLUCOSE, CAPILLARY     Status: Normal   Collection Time   06/17/12  9:39 PM      Component Value Range Comment   Glucose-Capillary 70  70 - 99 mg/dL   GLUCOSE, CAPILLARY     Status: Normal   Collection Time   06/18/12 12:43 AM      Component Value Range Comment   Glucose-Capillary 86  70 - 99 mg/dL    Comment 1 Documented in Chart      Comment 2 Notify RN     GLUCOSE, CAPILLARY     Status: Abnormal   Collection Time   06/18/12  4:25 AM      Component Value Range Comment   Glucose-Capillary 101 (*) 70 - 99 mg/dL    Comment 1 Documented in Chart      Comment 2 Notify RN     LIPID PANEL     Status: Abnormal   Collection Time   06/18/12  6:41 AM      Component Value Range Comment   Cholesterol 94  0 - 200 mg/dL    Triglycerides 161 (*) <150 mg/dL    HDL 26 (*) >09 mg/dL    Total CHOL/HDL Ratio 3.6      VLDL 46 (*) 0 - 40 mg/dL    LDL Cholesterol 22  0 - 99 mg/dL   GLUCOSE, CAPILLARY     Status: Abnormal   Collection Time   06/18/12  7:57 AM      Component Value Range Comment   Glucose-Capillary 127 (*) 70 - 99 mg/dL    Comment 1 Notify RN      Comment 2 Documented in Chart       Ct Head Wo Contrast  06/17/2012  *RADIOLOGY REPORT*  Clinical Data: Code stroke  CT HEAD WITHOUT CONTRAST  Technique:  Contiguous axial images were obtained from the base of the skull through the vertex without contrast.  Comparison: 11/06/2010  Findings: There is no evidence for acute hemorrhage, hydrocephalus, mass lesion, or abnormal extra-axial fluid collection.  No  definite CT evidence for acute infarction.  Patchy low attenuation in the deep hemispheric and periventricular white matter is nonspecific, but likely reflects chronic microvascular ischemic demyelination. Right inferior cerebellar encephalomalacia is stable.  The visualized paranasal sinuses and mastoid air cells are clear.  IMPRESSION: Stable.  No acute intracranial abnormality.  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  I discussed these findings by telephone with Dr. Thad Ranger at approximately 1140 hours on 06/17/2012.  Original Report Authenticated By: ERIC A. MANSELL, M.D.    Review of Systems  Constitutional: Positive for diaphoresis. Negative for fever.  HENT: Negative for congestion and sore throat.   Eyes: Positive for blurred vision.  Respiratory: Negative for cough and shortness of breath.   Cardiovascular: Negative for chest pain, palpitations, orthopnea, leg swelling and PND.  Gastrointestinal: Positive for nausea and vomiting. Negative for abdominal pain, diarrhea, constipation, blood in stool and melena.  Genitourinary: Negative for dysuria ([id number]) and hematuria.  Musculoskeletal:       Patient complains of pain lateral portion of the pacer site.  Neurological: Positive for dizziness, tingling (in the left foot) and weakness (right sided). Negative for headaches.   Blood pressure 110/62, pulse 74, temperature 98.3 F (36.8 C), temperature source Oral, resp. rate 20, SpO2 96.00%.  Physical Exam  Constitutional: He is oriented to person, place, and time. No distress.       Obese deconditioned male  HENT:  Head: Normocephalic and atraumatic.  Eyes: EOM are normal.  Neck: Neck supple.  Cardiovascular: Normal rate, regular rhythm, S1 normal and S2 normal.   No murmur heard. Pulses:      Radial pulses are 2+ on the right side, and 2+ on the left side.       Dorsalis pedis pulses are 2+ on the right side, and 2+ on the left  side.       No carotid bruit  Respiratory: Effort normal and breath sounds normal. He has no wheezes. He has no rales.  GI: Soft. Bowel sounds are normal. He exhibits distension. There is no tenderness.  Musculoskeletal: He exhibits no edema.       No lower extremity edema  Lymphadenopathy:    He has no cervical adenopathy.  Neurological: He is alert and oriented to person, place, and time. He exhibits abnormal muscle tone.       Strengthen the left arm appears to be 3/5 .  Strength in the right arm appears to be 2/5  Skin: Skin is warm.       Pacer site: There is a small area of ecchymosis inferior to pacer site. There are linear healing excoriations on the low pacer site. Present proximal to 3 cm minimal linear area of erythema along the left hand edge of the pacer which appears to be very close to the skin surface.  There appears to be mild edema over the pacer pocket.  The skin is very tender along the left lateral edge.  Psychiatric:       Appears slow to mentate    Assessment/Plan: Patient Active Hospital Problem List: Hemiplegia, unspecified, affecting dominant side (06/17/2012) Acute right-sided weakness (06/17/2012) Atrial fibrillation () CHF (congestive heart failure) () Dyslipidemia () Hypertension () Chronic kidney disease, stage 3, mod decreased GFR () Vomiting (06/17/2012) Diarrhea (06/17/2012) S/P placement of cardiac pacemaker (06/17/2012)  Plan:  56 year old obese Caucasian male with multiple comorbidities. He presented with right-sided hemiplegia. CT scan of the head was negative for acute abnormality. RS to see him do to his ongoing issues with his pacer site.  The patient appears to be a somewhat displaced to the left and is clearly causing him some discomfort.  Does not appear to be infected. The pain may be related to her external irritation.  M.D. Opinion to follow    HAGER, BRYAN 06/18/2012, 10:37 AM    I have seen and examined the patient along with Wilburt Finlay,  PA.  I have reviewed the chart, notes and new data.  I agree with PA's note.  Key new complaints: neurological complaints appear to be subjectively better Key examination changes: there is definitely swelling at the ICD pocket site, but this appears at most slightly worse from previous exams and is not tense. There is no sign of infection along the incision scar as he describes. I am more worried about the erythema around the lateral lower corner of the pocket, where the skin is definitely tense and compressed. I still appears to be mobile over the device, although because of the degree of tension, it is hard to exclude adhesions. No fever. Key new findings / data:  WBC is slightly low (similar to other readings over the last year).   PLAN: The ICD pocket exam is definitely worrisome, but not overtly diagnostic of infection. The  pocket has been moderately swollen since its last revision in January 2011.  Removal of his device and leads is NOT a trivial undertaking for the following reasons:  1. He has had previous appropriate ICD discharges for true VF (associated with loss of consciousness), within the last year. 2. He is a CRT responder and removal of device and leads could lead to CHF decompensation 3. 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 4. He has four leads in place, including one dual coil ICD lead and one single coil ICD lead, which will all have to be removed, making explantation a very risky proposition.  Will ask Dr. Ladona Ridgel or one of his colleagues for an evaluation. At this point I recommend conservative therapy. Will check blood cultures.   Thurmon Fair, MD, [facility] [phone] 06/18/2012, 1:43 PM

## 2012-06-18 NOTE — Progress Notes (Addendum)
VASCULAR LAB PRELIMINARY  PRELIMINARY  PRELIMINARY  PRELIMINARY  Carotid duplex completed.    Preliminary report: Mild calcific plaque noted in right proximal ICA.   No significant plaque noted on left.  No significant ICA stenosis noted bilaterally.  [name], 06/18/2012, 4:58 PM

## 2012-06-18 NOTE — Evaluation (Signed)
Speech Language Pathology Evaluation Patient Details Name: [name] MRN: [id number] DOB: [birth year] Today's Date: 06/18/2012 Time: 7829-5621 SLP Time Calculation (min): 15 min  Problem List:  Patient Active Problem List  Diagnosis  . Hemiplegia, unspecified, affecting dominant side  . Acute right-sided weakness  . Atrial fibrillation  . CHF (congestive heart failure)  . Dyslipidemia  . Hypertension  . Chronic kidney disease, stage 3, mod decreased GFR  . Vomiting  . Diarrhea  . S/P placement of cardiac pacemaker   Past Medical History:  Past Medical History  Diagnosis Date  . Diabetes mellitus   . Hypertension   . GERD (gastroesophageal reflux disease)   . Dyslipidemia   . Stroke   . CHF (congestive heart failure)   . Atrial fibrillation   . Asthma   . Pneumonia   . Chronic kidney disease, stage 3, mod decreased GFR    Past Surgical History:  Past Surgical History  Procedure Date  . Internal pacemaker    HPI:  56 year old male admitted with right sided weakness.  Patient stated his speech was also slurred yesterday, but is now "close to normal."  PMH of right CVA with left sided weakness.   Assessment / Plan / Recommendation Clinical Impression  Patient appears to be at baseline for speech and language function.    SLP Assessment  Patient does not need any further Speech Lanaguage Pathology Services    Follow Up Recommendations  None    Frequency and Duration min 1 x/week      Pertinent Vitals/Pain n/a   SLP Goals     SLP Evaluation Prior Functioning  Cognitive/Linguistic Baseline: Within functional limits   Cognition  Overall Cognitive Status: Appears within functional limits for tasks assessed Arousal/Alertness: Awake/alert Orientation Level: Oriented X4 Attention: Divided Memory: Appears intact Problem Solving: Appears intact Behaviors:  (Intermittently slow to respond.) Safety/Judgment: Appears intact    Comprehension  Auditory  Comprehension Overall Auditory Comprehension: Appears within functional limits for tasks assessed Yes/No Questions: Within Functional Limits Commands: Within Functional Limits Conversation: Simple Reading Comprehension Reading Status: Within funtional limits    Expression Expression Primary Mode of Expression: Verbal Verbal Expression Overall Verbal Expression: Appears within functional limits for tasks assessed Initiation: No impairment Level of Generative/Spontaneous Verbalization: Conversation Repetition: No impairment Naming: No impairment   Oral / Motor Oral Motor/Sensory Function Overall Oral Motor/Sensory Function: Impaired at baseline Labial ROM: Reduced left Labial Symmetry: Abnormal symmetry right Labial Strength: Reduced Labial Sensation: Within Functional Limits Lingual ROM: Within Functional Limits Lingual Symmetry: Within Functional Limits Lingual Strength: Within Functional Limits Facial ROM: Reduced left Facial Symmetry: Within Functional Limits Facial Strength: Reduced Motor Speech Overall Motor Speech: Appears within functional limits for tasks assessed Respiration: Within functional limits Phonation: Normal Resonance: Within functional limits Articulation: Within functional limitis Intelligibility: Intelligible Motor Planning: Witnin functional limits        [name] T 06/18/2012, 9:52 AM

## 2012-06-18 NOTE — Consult Note (Signed)
ELECTROPHYSIOLOGY CONSULT NOTE  Patient ID: [name] MRN: [id number], DOB/AGE: [birth year]   Admit date: 06/17/2012 Date of Consult: 06/18/2012  Primary Physician: Galvin Proffer, MD Primary Cardiologist: Royann Shivers, MD Reason for Consultation: Possible BiV ICD pocket infection  History of Present Illness [name] is a 56 year old gentleman with an ischemic CM, prior VF arrest s/p ICD implantation in 2006 with upgrade to BiV ICD with CRT in January 2011, severe CAD, insulin-dependent diabetes, hyperlipidemia, hypertension, chronic kidney disease-stage III, atrial fibrillation, stroke and asthma who has been admitted with right sided weakness and is undergoing neurologic work-up to r/o CVA. While here, he reports pain and swelling at his ICD implant site. This has been evaluated as well by his primary cardiologist who has requested we provide further EP recommendations.   His ICD was originally implanted in April 2006. He has been hospitalized in the past with multiple ICD shocks. Most recently, his ejection fraction is improved to 35-40%. In January 2011, he underwent BiV ICD generator change and RV lead revision by Dr. Sharol Harness at [facility]. According to his records, he developed a hematoma post-operatively which resolved over time without intervention. [name] states his implant site "has never been right" since this procedure. But more recently he has noticed increasing pain and swelling at the site x 2 months. He also reports "a boil" over the lateral edge about 2 months ago which he drained at home by himself. He was given oral antibiotics by his PCP and scheduled for follow-up at [facility]. He was seen by Dr. Alanda Amass on 05/23/2012.   He denies chest pain, shortness of breath, fever, abdominal pain, palpitations, dizziness, near syncope or syncope. He denies lower extremity swelling, cough, orthopnea or PND.   Past Medical History  Diagnosis Date  . Diabetes  mellitus with peripheral neuropathy   . Hypertension   . GERD (gastroesophageal reflux disease)   . Dyslipidemia   . Stroke 1996   . CHF (congestive heart failure)   . Atrial fibrillation   . Asthma   . Pneumonia   . Chronic kidney disease, stage III   . Hepatitis C   . Prior toe amputation secondary to osteomyelitis   . Ischemic cardiomyopathy  s/p ICD implantation 2006. s/p BiV ICD upgrade January 2011. At that time, required balloon venoplasty due to subclavian vein stenosis.   . Multivessel CAD s/p anterior MI in 1998. s/p LAD and PDA intervention in 2009. In-stent restenosis with a staged intervention to sites in  November 2011. Presented in March 2012 with VT and was found to have an occluded LAD, the PDA was patent s/p intervention with cutting balloon and angioplasty at multiple sites in the LAD     Allergies/Intolerances Allergies  Allergen Reactions  . Cephalosporins Nausea Only  . Morphine And Related Other (See Comments)    hypotension  . Penicillins Other (See Comments)    Reaction as a child -unknown    Inpatient Medications . amiodarone  200 mg Oral BID  . antiseptic oral rinse  15 mL Mouth Rinse q12n4p  . carvedilol  9.375 mg Oral BID WC  . colchicine  0.6 mg Oral Daily  . donepezil  5 mg Oral QHS  . enoxaparin (LOVENOX) injection  40 mg Subcutaneous Q24H  . febuxostat  40 mg Oral Daily  . fenofibrate  160 mg Oral Daily  . gabapentin  300 mg Oral TID  . hydrALAZINE  25 mg Oral TID  . HYDROcodone-acetaminophen  1 tablet  Oral QHS  . insulin aspart  0-5 Units Subcutaneous QHS  . insulin aspart  0-9 Units Subcutaneous TID WC  . insulin aspart  4 Units Subcutaneous TID WC  . insulin glargine  20 Units Subcutaneous QHS  . isosorbide mononitrate  90 mg Oral QHS  . magnesium oxide  400 mg Oral BID  . pantoprazole  40 mg Oral Q1200  . Tamsulosin HCl  0.4 mg Oral Daily  . Ticagrelor  90 mg Oral BID  . vitamin B-12  250 mcg Oral Daily   Family History Positive  for premature CAD   Social History Social History  . Marital Status: Divorced   Social History Main Topics  . Smoking status: Never Smoker   . Smokeless tobacco: Not on file  . Alcohol Use: No  . Drug Use: No   Review of Systems General:  No chills, fever, night sweats or weight changes  Cardiovascular:  No chest pain, dyspnea on exertion, edema, orthopnea, palpitations, paroxysmal nocturnal dyspnea Dermatological: No rash, lesions or masses Respiratory: No cough, dyspnea Urologic: No hematuria, dysuria Abdominal: No nausea, vomiting, diarrhea, bright red blood per rectum, melena, or hematemesis Neurologic: Positive for weakness. No visual changes or changes in mental status All other systems reviewed and are otherwise negative except as noted above.  Physical Exam Blood pressure 115/60, pulse 77, temperature 98.3 F (36.8 C), temperature source Oral, resp. rate 20, SpO2 98.00%.  General: Well developed, well appearing 56 year old male in no acute distress. HEENT: Normocephalic, atraumatic. EOMs intact. Sclera nonicteric. Oropharynx clear.  Neck: Supple. No JVD. Lungs: Respirations regular and unlabored, CTA bilaterally. No wheezes, rales or rhonchi. Heart: RRR. S1, S2 present. No murmurs, rub, S3 or S4. Abdomen: Soft, non-tender, non-distended. BS present x 4 quadrants. No hepatosplenomegaly.  Extremities: No clubbing, cyanosis or edema. DP/PT/Radials 2+ and equal bilaterally. Psych: Normal affect. Neuro: Alert and oriented X 3. Moves all extremities spontaneously. Skin: Left upper chest/ICD implant site intact with no active bleeding or hematoma. There is no warmth or drainage. The lateral edge is adhered to skin and non-mobile. There is tenderness to palpation of site.   Labs  [facility] 06/17/12 1138  CKTOTAL 147  CKMB 4.3*  TROPONINI <0.30   Lab Results  Component Value Date   WBC 3.3* 06/17/2012   HGB 11.9* 06/17/2012   HCT 35.0* 06/17/2012   MCV 86.7 06/17/2012   PLT  113* 06/17/2012    Lab 06/17/12 1156 06/17/12 1138  NA 134* --  K 4.2 --  CL 97 --  CO2 -- 28  BUN 35* --  CREATININE 2.20* --  CALCIUM -- 9.3  PROT -- 7.4  BILITOT -- 0.7  ALKPHOS -- 70  ALT -- 33  AST -- 43*  GLUCOSE 213* --   No components found with this basename: MAGNESIUM No components found with this basename: POCBNP:3 No results found for this basename: DDIMER   No results found for this basename: TSH,T4TOTAL,FREET3,T3FREE,THYROIDAB in the last 72 hours   Basename 06/17/12 1138  INR 1.17    Radiology/Studies Ct Head Wo Contrast  06/17/2012  *RADIOLOGY REPORT*  Clinical Data: Code stroke  CT HEAD WITHOUT CONTRAST  Technique:  Contiguous axial images were obtained from the base of the skull through the vertex without contrast.  Comparison: 11/06/2010  Findings: There is no evidence for acute hemorrhage, hydrocephalus, mass lesion, or abnormal extra-axial fluid collection.  No definite CT evidence for acute infarction.  Patchy low attenuation in the deep hemispheric and  periventricular white matter is nonspecific, but likely reflects chronic microvascular ischemic demyelination. Right inferior cerebellar encephalomalacia is stable.  The visualized paranasal sinuses and mastoid air cells are clear.  IMPRESSION: Stable.  No acute intracranial abnormality.  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  I discussed these findings by telephone with Dr. Thad Ranger at approximately 1140 hours on 06/17/2012.  Original Report Authenticated By: ERIC A. MANSELL, M.D.   Echocardiogram  pending  Assessment and Plan 1. Probable ICD pocket infection - he is afebrile and hemodynamically stable; agree with blood cultures and echocardiogram; will check a sedimentation rate  Agree with Dr. Erin Hearing assessment regarding potential need and risks associated with device removal. The patient was seen, examined and plan of care formulated with Dr. Hillis Range. Please see Dr. Jenel Lucks recommendations outlined below. Signed, Rick Duff, PA-C 06/18/2012, 3:39 PM  I have seen, examined the patient, and reviewed the above assessment and plan.  Changes to above are made where necessary.   The patient is s/p BIV ICD for ischemic CM in 2006.  Generator change with new RV defibrillator lead placement was performed by Dr Sharol Harness at [facility] in 2011.  Since that time, he reports chronic swelling and pain over the device sight.  He reports having drainage from a "blister" several months ago.  Presently on exam, the pocket is fluctuant but not warm or erhythematous.  He does have retraction and very thin skin over the lateral border with threatened erosion.  He has no clinical symptoms of bacteremia or more extensive infection and I therefore believe that this is localized to the pocket.  He denies fevers, chills, fatigue, etc. At this point, our options are to continue watchful waiting or to proceed with device extraction electively.  I agree with Dr Royann Shivers that the risks of extraction are significant and would include vascular damage and death.  The patient is adamant that he would like to have his ICD system extracted at the next available time.  I have informed him that this is not an urgent issue (indulent infection x 2 years).  I would not recommend antibiotics at this point as they would not alter his clinical coarse.  He is clear that he does not wish to return to [facility] to have Dr Sharol Harness look at his pocket as I have advised today.  As Doctor Rosette Reveal is our extractionist, I will have him evaluated by Dr Ladona Ridgel in the office at the next available time (likely in the next 1-2 weeks).  Once evaluated by Dr Ladona Ridgel, they can discuss timing of extraction at that point.  I will see as needed while here. Please call with questions.  Co Sign: Hillis Range, MD 06/18/2012 4:29 PM

## 2012-06-18 NOTE — Evaluation (Signed)
Clinical/Bedside Swallow Evaluation Patient Details  Name: [name] MRN: [id number] Date of Birth: Jun 04, 1956  Today's Date: 06/18/2012 Time: 4098-1191 SLP Time Calculation (min): 25 min  Past Medical History:  Past Medical History  Diagnosis Date  . Diabetes mellitus   . Hypertension   . GERD (gastroesophageal reflux disease)   . Dyslipidemia   . Stroke   . CHF (congestive heart failure)   . Atrial fibrillation   . Asthma   . Pneumonia   . Chronic kidney disease, stage 3, mod decreased GFR    Past Surgical History:  Past Surgical History  Procedure Date  . Internal pacemaker    HPI:  56 year old male admitted with right sided weakness.  Patient stated his speech was also slurred yesterday, but is now "close to normal."  PMH of right CVA with left sided weakness.   Assessment / Plan / Recommendation Clinical Impression  Patient exhibits no s/s of aspiration at bedside.  Appears to have normal swallowing function at this time.    Aspiration Risk  Mild    Diet Recommendation Regular;Thin liquid   Liquid Administration via: Straw;Cup Medication Administration: Whole meds with liquid Supervision: Patient able to self feed;Intermittent supervision to cue for compensatory strategies Compensations: Small sips/bites Postural Changes and/or Swallow Maneuvers: Seated upright 90 degrees    Other  Recommendations Oral Care Recommendations: Oral care BID Other Recommendations: Clarify dietary restrictions (Carb modified)   Follow Up Recommendations  None    Frequency and Duration min 1 x/week  1 week   Pertinent Vitals/Pain n/a    SLP Swallow Goals Patient will consume recommended diet without observed clinical signs of aspiration with: Independent assistance Patient will utilize recommended strategies during swallow to increase swallowing safety with: Independent assistance   Swallow Study Prior Functional Status       General HPI: 56 year old male admitted  with right sided weakness.  Patient stated his speech was also slurred yesterday, but is now "close to normal."  PMH of right CVA with left sided weakness. Type of Study: Bedside swallow evaluation Diet Prior to this Study: Regular Temperature Spikes Noted: No Respiratory Status: Room air History of Recent Intubation: No Behavior/Cognition: Alert;Cooperative;Other (comment) (Slow to respond at times.) Oral Cavity - Dentition: Adequate natural dentition Self-Feeding Abilities: Able to feed self Patient Positioning: Upright in bed Baseline Vocal Quality: Clear Volitional Cough: Strong Volitional Swallow: Able to elicit    Oral/Motor/Sensory Function Overall Oral Motor/Sensory Function: Impaired at baseline Labial ROM: Reduced left Labial Symmetry: Abnormal symmetry right Labial Strength: Reduced Labial Sensation: Within Functional Limits Lingual ROM: Within Functional Limits Lingual Symmetry: Within Functional Limits Lingual Strength: Within Functional Limits Facial ROM: Reduced left Facial Symmetry: Within Functional Limits Facial Strength: Reduced   Ice Chips     Thin Liquid Thin Liquid: Within functional limits Presentation: Spoon;Cup;Straw    Nectar Thick Nectar Thick Liquid: Within functional limits Presentation: Cup   Honey Thick Honey Thick Liquid: Not tested   Puree Puree: Within functional limits Presentation: Spoon   Solid Solid: Within functional limits (Slow chewing motion initially.  ) Presentation: Self Daine Gravel, Rafe Mackowski T 06/18/2012,9:40 AM

## 2012-06-18 NOTE — Evaluation (Signed)
Physical Therapy Evaluation Patient Details Name: [name] MRN: [id number] DOB: 01-Jul-1956 Today's Date: 06/18/2012 Time: 2956-2130 PT Time Calculation (min): 37 min  PT Assessment / Plan / Recommendation Clinical Impression  Pt adm with onset of Rt sided weakness with h/o Lt sided weakness from prior CVA. Head CT negative, cannot do MRI due to pacemaker. Pt with variable performance with strength testing, not suggestive of CVA. Pt does report h/o multiple falls related to his gout and Charcot foot pain and imbalance. He refuses use of RW ("there's not room for it in my mobile home") and refuses HHPT for home safety eval. Pt agrees he is at risk for falls and is requesting "20 days of rehab" at a skilled facility. He wants to be sure to go to a facility in [place] that his primary doctors have privileges with. May benefit from PT to incr safety and decr risk of falls.    PT Assessment  Patient needs continued PT services    Follow Up Recommendations  Skilled nursing facility;Supervision for mobility/OOB    Barriers to Discharge Decreased caregiver support      Equipment Recommendations  Defer to next venue    Recommendations for Other Services     Frequency Min 3X/week    Precautions / Restrictions Precautions Precautions: Fall Precaution Comments: Pt with h/o numerous falls per pt Required Braces or Orthoses: Other Brace/Splint Other Brace/Splint: Post-op shoe on Lt due to Charcot foot   Pertinent Vitals/Pain Lt foot (Charcot)-chronic pain      Mobility  Bed Mobility Bed Mobility: Not assessed Transfers Transfers: Sit to Stand;Stand to Sit Sit to Stand: 5: Supervision;With upper extremity assist;From chair/3-in-1;With armrests Stand to Sit: 5: Supervision;With upper extremity assist;With armrests;To chair/3-in-1 Details for Transfer Assistance: supervision for safety due to h/o falls and inconsistent effort with strength testing Ambulation/Gait Ambulation/Gait  Assistance: 4: Min guard Ambulation Distance (Feet): 25 Feet Assistive device: Straight cane;Other (Comment) (Rt shoe; Lt post-op shoe) Ambulation/Gait Assistance Details: Pt with incr truncal sway (mostly anterior-posterior) and occasional slight buckling of Rt knee (with pt able to recover with use of cane. Pt uses cane in his Lt hand (same side as Charcot, painful foot). Discussed reasons to use cane in Rt hand and he agreed to try this next time. Gait Pattern: Step-to pattern;Decreased stride length;Decreased hip/knee flexion - right;Decreased hip/knee flexion - left;Right foot flat;Left foot flat;Wide base of support Gait velocity: severely impaired Modified Rankin (Stroke Patients Only) Pre-Morbid Rankin Score: Moderate disability Modified Rankin: Moderately severe disability    Exercises     PT Diagnosis: Difficulty walking  PT Problem List: Decreased strength PT Treatment Interventions: DME instruction;Gait training;Stair training;Functional mobility training;Therapeutic activities;Therapeutic exercise;Balance training;Patient/family education   PT Goals Acute Rehab PT Goals PT Goal Formulation: With patient Time For Goal Achievement: 06/25/12 Potential to Achieve Goals: Good Pt will go Supine/Side to Sit: with modified independence;with HOB 0 degrees PT Goal: Supine/Side to Sit - Progress: Goal set today Pt will go Sit to Supine/Side: with modified independence;with HOB 0 degrees PT Goal: Sit to Supine/Side - Progress: Goal set today Pt will go Sit to Stand: with modified independence;with upper extremity assist PT Goal: Sit to Stand - Progress: Goal set today Pt will Ambulate: 16 - 50 feet;with supervision;with least restrictive assistive device PT Goal: Ambulate - Progress: Goal set today Pt will Go Up / Down Stairs: 6-9 stairs;with rail(s);with cane;with min assist PT Goal: Up/Down Stairs - Progress: Goal set today  Visit Information  Last PT  Received On:  06/18/12 Assistance Needed: +1    Subjective Data  Subjective: I think I need to go to rehab for at least 20 days. Patient Stated Goal: Go to skilled rehab   Prior Functioning  Home Living Lives With: Other (Comment) (brother) Available Help at Discharge: Other (Comment) (brother works various hours Emergency planning/management officer at [employer])) Type of Home: Mobile home (rental) Home Access: Stairs to enter Secretary/administrator of Steps: 6 Entrance Stairs-Rails: Left Home Layout: One level;Other (Comment) (1 step up to living room) Bathroom Shower/Tub: Tub/shower unit;Curtain Bathroom Toilet: Standard Bathroom Accessibility: No Home Adaptive Equipment: Straight cane Prior Function Level of Independence: Needs assistance Needs Assistance: Bathing;Meal Prep;Light Housekeeping Bath: Supervision/set-up (if brother not there, he does bath at sink independently) Able to Take Stairs?: Yes Driving: Yes Communication Communication: No difficulties Dominant Hand: Right    Cognition  Overall Cognitive Status: Appears within functional limits for tasks assessed/performed Arousal/Alertness: Awake/alert Orientation Level: Oriented X4 / Intact Behavior During Session: [employer] for tasks performed    Extremity/Trunk Assessment Right Lower Extremity Assessment RLE ROM/Strength/Tone: Deficits RLE ROM/Strength/Tone Deficits: Pt with inconsistent effort--with no resistance, pt 3-/5; however with resistance given eccentrically at various speeds, pt with 5/5 knee extension and flexion RLE Sensation: History of peripheral neuropathy Left Lower Extremity Assessment LLE ROM/Strength/Tone: Deficits LLE ROM/Strength/Tone Deficits: Pt with inconsistent effort--with no resistance, pt 3-/5; however with resistance given eccentrically at various speeds, pt with 4/5 knee extension and flexion LLE Sensation: History of peripheral neuropathy Trunk Assessment Trunk Assessment: Normal   Balance Balance Balance Assessed:  Yes Static Sitting Balance Static Sitting - Balance Support: No upper extremity supported;Feet supported Static Sitting - Level of Assistance: 7: Independent Static Standing Balance Static Standing - Balance Support: Left upper extremity supported Static Standing - Level of Assistance: 5: Stand by assistance Static Standing - Comment/# of Minutes: incr ant-post sway  End of Session PT - End of Session Equipment Utilized During Treatment: Gait belt;Other (comment) (Lt post-op shoe) Activity Tolerance: Patient limited by fatigue Patient left: in chair;with call bell/phone within reach Nurse Communication: Mobility status  GP     [name] 06/18/2012, 12:43 PM  Pager [phone]

## 2012-06-18 NOTE — Progress Notes (Signed)
[facility] PROGRESS NOTE    SUBJECTIVE   Right arm is feeling stronger today. No other complaints. Tells me he was not on ASA daily due to interaction with Uloric he takes for gout, however he is on Brilinta.  OBJECTIVE   Vital signs in last 24 hours: Temp:  [97.4 F (36.3 C)-98.6 F (37 C)] 98.3 F (36.8 C) (07/23 0620) Pulse Rate:  [70-74] 74  (07/23 0620) Resp:  [10-23] 20  (07/23 0620) BP: (93-124)/(41-76) 110/62 mmHg (07/23 0620) SpO2:  [96 %-100 %] 96 % (07/23 0620) FiO2 (%):  [2 %] 2 % (07/22 1521)  Intake/Output from previous day: 07/22 0701 - 07/23 0700 In: -  Out: 700 [Urine:700] Intake/Output this shift:   Nutritional status: NPO  Past Medical History  Diagnosis Date  . Diabetes mellitus   . Hypertension   . GERD (gastroesophageal reflux disease)   . Dyslipidemia   . Stroke   . CHF (congestive heart failure)   . Atrial fibrillation   . Asthma   . Pneumonia   . Chronic kidney disease, stage 3, mod decreased GFR     Neurologic Exam:  Mental Status: Alert, oriented X 3.  Speech slow but fluent without evidence of aphasia. Able to follow 3 step commands without difficulty. Mood: Depressed Memory: intact Thought content appropriate Cranial Nerves: II-Visual fields grossly intact. III/IV/VI-Extraocular movements intact.  Pupils reactive bilaterally. Ptosis not present. V/VII-Smile symmetric but patient gives poor effort when asked to smile VIII-decreased on right and continue to split midline in face and sternum IX/X-normal gag XI-bilateral shoulder shrug XII-midline tongue extension Motor: 4/5 bilaterally with normal tone and bulk with poor effort given during exam Sensory: Pinprick and light touch intact throughout, bilaterally Deep Tendon Reflexes: 2+ and symmetric upper extremities and negligible in LE. No achilles reflex noted bilaterally Plantars:      Right:  mute     Left:  mute Cerebellar:  Normal finger-to-nose, normal heel-to-shin test.     Lab Results: Lab Results  Component Value Date/Time   CHOL 94 06/18/2012  6:41 AM   Lipid Panel  Basename 06/18/12 0641  CHOL 94  TRIG 232*  HDL 26*  CHOLHDL 3.6  VLDL 46*  LDLCALC 22    Studies/Results: Ct Head Wo Contrast  06/17/2012  *RADIOLOGY REPORT*  Clinical Data: Code stroke  CT HEAD WITHOUT CONTRAST  Technique:  Contiguous axial images were obtained from the base of the skull through the vertex without contrast.  Comparison: 11/06/2010  Findings: There is no evidence for acute hemorrhage, hydrocephalus, mass lesion, or abnormal extra-axial fluid collection.  No definite CT evidence for acute infarction.  Patchy low attenuation in the deep hemispheric and periventricular white matter is nonspecific, but likely reflects chronic microvascular ischemic demyelination. Right inferior cerebellar encephalomalacia is stable.  The visualized paranasal sinuses and mastoid air cells are clear.  IMPRESSION: Stable.  No acute intracranial abnormality.  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  I discussed these findings by telephone with Dr. Thad Ranger at approximately 1140 hours on 06/17/2012.  Original Report Authenticated By: ERIC A. MANSELL, M.D.    Medications:     Prior to Admission:  Prescriptions prior to admission  Medication Sig Dispense Refill  . amiodarone (PACERONE) 200 MG tablet  Take 200 mg by mouth 2 (two) times daily.        . carvedilol (COREG) 6.25 MG tablet Take 9.375 mg by mouth 2 (two) times daily with a meal.        . colchicine 0.6 MG tablet Take 0.6 mg by mouth daily.      Marland Kitchen donepezil (ARICEPT) 5 MG tablet Take 5 mg by mouth at bedtime.      . febuxostat (ULORIC) 40 MG tablet Take 40 mg by mouth daily.      . fenofibrate (TRICOR) 145 MG tablet Take 145 mg by mouth daily.      . furosemide (LASIX) 40 MG tablet Take 40 mg by mouth 2 (two) times daily.        Marland Kitchen  gabapentin (NEURONTIN) 300 MG capsule Take 300 mg by mouth 3 (three) times daily.        Marland Kitchen glipiZIDE (GLUCOTROL) 5 MG tablet Take 5 mg by mouth 2 (two) times daily before a meal.      . hydrALAZINE (APRESOLINE) 25 MG tablet Take 25 mg by mouth 3 (three) times daily.        Marland Kitchen HYDROcodone-acetaminophen (VICODIN) 5-500 MG per tablet Take 1 tablet by mouth at bedtime.      . insulin aspart (NOVOLOG) 100 UNIT/ML injection Inject 1-12 Units into the skin 3 (three) times daily before meals.      . insulin glargine (LANTUS) 100 UNIT/ML injection Inject 45 Units into the skin daily.       . isosorbide mononitrate (IMDUR) 60 MG 24 hr tablet Take 90 mg by mouth at bedtime.        Marland Kitchen linezolid (ZYVOX) 600 MG tablet Take 600 mg by mouth 2 (two) times daily.      . magnesium oxide (MAG-OX) 400 MG tablet Take 400 mg by mouth 2 (two) times daily.      Marland Kitchen omeprazole (PRILOSEC) 20 MG capsule Take 40 mg by mouth 2 (two) times daily.      Marland Kitchen spironolactone (ALDACTONE) 25 MG tablet Take 25 mg by mouth 2 (two) times daily.      . Tamsulosin HCl (FLOMAX) 0.4 MG CAPS Take 0.4 mg by mouth daily.      . Ticagrelor (BRILINTA) 90 MG TABS tablet Take 90 mg by mouth 2 (two) times daily.       . vitamin B-12 (CYANOCOBALAMIN) 250 MCG tablet Take 250 mcg by mouth daily.      . nitroGLYCERIN (NITROSTAT) 0.4 MG SL tablet Place 0.4 mg under the tongue every 5 (five) minutes as needed. For chest pain        Scheduled:   . amiodarone  200 mg Oral BID  . antiseptic oral rinse  15 mL Mouth Rinse q12n4p  . carvedilol  9.375 mg Oral BID WC  . colchicine  0.6 mg Oral Daily  . donepezil  5 mg Oral QHS  . enoxaparin (LOVENOX) injection  40 mg Subcutaneous Q24H  . febuxostat  40 mg Oral Daily  . fenofibrate  160 mg Oral Daily  . gabapentin  300 mg Oral TID  . hydrALAZINE  25 mg Oral TID  . HYDROcodone-acetaminophen  1 tablet Oral QHS  . isosorbide mononitrate  90 mg Oral QHS  . magnesium oxide  400 mg Oral BID  . pantoprazole  40 mg  Oral Q1200  . sodium chloride  250 mL Intravenous Once  . Tamsulosin HCl  0.4 mg Oral Daily  . Ticagrelor  90 mg  Oral BID  . vitamin B-12  250 mcg Oral Daily  . DISCONTD: insulin aspart  0-9 Units Subcutaneous TID WC  . DISCONTD: magnesium oxide  400 mg Oral BID  . DISCONTD: vitamin B-12  250 mcg Oral Daily    Assessment/Plan:   Assessment/Plan:  56 yo male presenting as a code stoke with complaints of right sided numbness and weakness. Some of the patient's finding on exam continue to appear functional but patient has multiple risk factors that inclue DM, HTN, hyperlipidemia, atrial fibrillation and stroke in the past. He states he is not on ASA daily due to interactions with Uloric medication he takes for gout.  He is on Brilinta currently.    1. HgbA1c, fasting lipid panel --Pending 2. PT consult, OT consult,  3. Echocardiogram --pending 4. Carotid dopplers --pending 5. Repeat head CT in 2-3 days  6. Prophylactic therapy-currently on Brilinta 7. Risk factor modification  8. Telemetry monitoring  9. Frequent neuro checks    Felicie Morn PA-C [facility] [phone]  06/18/2012, 9:21 AM

## 2012-06-18 NOTE — Care Management Note (Signed)
    Page 1 of 1   06/19/2012     3:10:58 PM   CARE MANAGEMENT NOTE 06/19/2012  Patient:  [name], [name]   Account Number:  [id number]  Date Initiated:  06/18/2012  Documentation initiated by:  Onnie Boer  Subjective/Objective Assessment:   PT WAS ADMITTED WITH WEAKNESS     Action/Plan:   PROGRESSION OF CARE AND DISCHARGE PLANNING   Anticipated DC Date:  06/20/2012   Anticipated DC Plan:  HOME W HOME HEALTH SERVICES         Choice offered to / List presented to:             Status of service:  In process, will continue to follow Medicare Important Message given?   (If response is "NO", the following Medicare IM given date fields will be blank) Date Medicare IM given:   Date Additional Medicare IM given:    Discharge Disposition:    Per UR Regulation:  Reviewed for med. necessity/level of care/duration of stay  If discussed at Long Length of Stay Meetings, dates discussed:    Comments:  06/19/12 Onnie Boer, RN, BSN 1449 PT HAS STATED THAT HE USE TO BE AT [facility] AND WILL NOT RETURN.  RECOMMENDATION IS FOR [facility] AND PT REFERS TO IT AS REHAB.  PT STATED THAT HE WOULD LIKE TO [facility] [place].  CSW MADE AWARE. WILL F/U.  06/18/12 Onnie Boer, RN, BSN 1428 PT WAS ADMITTED WITH WEAKNESS.  PTA PT WAS AT HOME WITH SELF/ FAMILY CARE.  WILL F/U ON P/OT EVAL AND DC NEEDS.

## 2012-06-18 NOTE — Clinical Documentation Improvement (Signed)
CHF DOCUMENTATION CLARIFICATION QUERY  THIS DOCUMENT IS NOT A PERMANENT PART OF THE MEDICAL RECORD  Please update your documentation within the medical record to reflect your response to this query.                                                                                    06/18/12  Dear Dr.Marshae Azam/ [facility],  In a better effort to capture your patient's severity of illness, reflect appropriate length of stay and utilization of resources, a review of the patient medical record has revealed the following indicators the diagnosis of Heart Failure.   Based on your clinical judgment, please clarify and document in a progress note and/or discharge summary the clinical condition associated with the following supporting information: In responding to this query please exercise your independent judgment.  The fact that a query is asked, does not imply that any particular answer is desired or expected.   This patient was admitted as a code stroke. "CHF" is documented in the progress notes. If possible, could you please help provide greater specificity for this patient in the progress note and discharge summary. THANK YOU!  [facility]ST PRACTICE: The acuity and type of CHF should be documented when known [acute, chronic, acute on chronic, systolic, diastolic, systolic and diastolic].  Possible Clinical Conditions?  - Chronic Systolic Congestive Heart Failure  - Acute on Chronic Systolic Congestive Heart Failure  - Other condition (please document in the progress notes and/or discharge summary)  - Cannot Clinically determine at this time    Supporting Information:  - "systolic heart failure" was documented in an earlier H&P on 07/17/11.  - "CHF/CAD/HTN - continue Brilinta . C/w BB, Imdur, Hydralazine. Not an ACEI candidate due to renal failure. Stopped aldactone due to renal failure." progress note 7/23.    Reviewed: additional documentation in the medical record   Thank You,  Saul Fordyce  Clinical Documentation Specialist: [phone] Pager  Health Information Management [place]

## 2012-06-18 NOTE — Progress Notes (Signed)
OT Cancellation Note  Treatment cancelled today due to patient's refusal to participate. Will check back on pt later as schedule allows. Thank you [name], OTR/L Pager: [phone] 06/18/2012    [name] 06/18/2012, 3:48 PM

## 2012-06-19 ENCOUNTER — Inpatient Hospital Stay (HOSPITAL_COMMUNITY): Payer: Medicare Other

## 2012-06-19 DIAGNOSIS — B999 Unspecified infectious disease: Secondary | ICD-10-CM | POA: Diagnosis present

## 2012-06-19 LAB — GLUCOSE, CAPILLARY
Glucose-Capillary: 167 mg/dL — ABNORMAL HIGH (ref 70–99)
Glucose-Capillary: 121 mg/dL — ABNORMAL HIGH (ref 70–99)

## 2012-06-19 NOTE — Progress Notes (Signed)
Subjective:  Pt still says he has Rt leg and arm weakness. Repeat CT of his head pending.  Objective:  Vital Signs in the last 24 hours: Temp:  [97.9 F (36.6 C)-98.3 F (36.8 C)] 97.9 F (36.6 C) (07/24 0957) Pulse Rate:  [69-74] 70  (07/24 0957) Resp:  [18-20] 18  (07/24 0957) BP: (94-120)/(47-70) 111/61 mmHg (07/24 0957) SpO2:  [98 %-100 %] 100 % (07/24 0957) FiO2 (%):  [99.1 %] 99.1 % (07/24 0200)  Intake/Output from previous day:  Intake/Output Summary (Last 24 hours) at 06/19/12 1138 Last data filed at 06/19/12 0700  Gross per 24 hour  Intake    240 ml  Output    825 ml  Net   -585 ml    Physical Exam: General appearance: alert, cooperative, no distress and moderately obese Lungs: clear to auscultation bilaterally Heart: regular rate and rhythm   Rate: 70  Rhythm: AV paced  Lab Results:  Basename 06/17/12 1156 06/17/12 1138  WBC -- 3.3*  HGB 11.9* 11.5*  PLT -- 113*    Basename 06/17/12 1156 06/17/12 1138  NA 134* 131*  K 4.2 4.1  CL 97 93*  CO2 -- 28  GLUCOSE 213* 219*  BUN 35* 33*  CREATININE 2.20* 2.36*    Basename 06/17/12 1138  TROPONINI <0.30   Hepatic Function Panel  Basename 06/17/12 1138  PROT 7.4  ALBUMIN 3.9  AST 43*  ALT 33  ALKPHOS 70  BILITOT 0.7  BILIDIR --  IBILI --    Basename 06/18/12 0641  CHOL 94    Basename 06/17/12 1138  INR 1.17    Imaging: Imaging results have been reviewed  Cardiac Studies:  Assessment/Plan:   Principal Problem:  *Hemiplegia, unspecified, affecting dominant side Active Problems:  Acute right-sided weakness  PAF (paroxysmal atrial fibrillation), appears to be in NSR, POD  Chronic kidney disease, stage 3-4, mod decreased GFR  ICD (implantable cardiac defibrillator) infection, chronic, seen by EP this admission.  Ventricular tachyarrhythmia, appropriate ICD discharges this year  Ischemic cardiomyopathy, he had been a responder to BiV ICD  Dyslipidemia  Hypertension  Vomiting  Diarrhea  Hypoglycemia associated with diabetes   Plan- Will get office records to chart, he says he has not been hospitalized for CHF anytime recently. He has not been to [facility] since 2011. In the past he was taken off Coumadin because of minor bleeding issues and the fact that he was holding NSR. He does not wish to got on it again but this may need to be revisited if he is determined to have had an embolic stroke. MD to see. He see Dr Ladona Ridgel as an OP to discuss ICD/lead extraction.   Corine Shelter PA-C 06/19/2012, 11:38 AM

## 2012-06-19 NOTE — Evaluation (Signed)
Occupational Therapy Evaluation Patient Details Name: [name] MRN: [id number] DOB: [birth year] Today's Date: 06/19/2012 Time: 0865-7846 OT Time Calculation (min): 28 min  OT Assessment / Plan / Recommendation Clinical Impression  Pt admitted with  onset of Rt sided weakness with h/o Lt sided weakness from prior CVA. Head CT negative, cannot do MRI due to pacemaker. Pt with variable performance with strength testing, not suggestive of CVA. Pt now agreeable to use of RW as he feels this allows him to be more stable. Pt presents with balance deficits and RUE shoulder and hand weakness. Pt will benefit from skilled OT in the acute setting to maximize I with ADL and ADL mobility prior to d/c.     OT Assessment  Patient needs continued OT Services    Follow Up Recommendations  Skilled nursing facility    Barriers to Discharge Decreased caregiver support brother works various hours throughout the week  Equipment Recommendations  Defer to next venue    Recommendations for Other Services    Frequency  Min 2X/week    Precautions / Restrictions Precautions Precautions: Fall Precaution Comments: Pt with h/o numerous falls per pt Required Braces or Orthoses: Other Brace/Splint Other Brace/Splint: Post-op shoe on Lt due to Charcot foot   Pertinent Vitals/Pain Pt reports pain in his "legs" but did not rate; states it is chronic    ADL  Grooming: Performed;Min guard Where Assessed - Grooming: Supported standing (leans on sink for support) Toilet Transfer: Performed;Min Pension scheme manager Method: Sit to Barista: Regular height toilet;Grab bars Toileting - Clothing Manipulation and Hygiene: Performed;Minimal assistance Where Assessed - Engineer, mining and Hygiene: Standing Equipment Used: Rolling walker;Gait belt Transfers/Ambulation Related to ADLs: Pt Min guard A with RW ambulation; pt with one posterior LOB requiring Mod to Max assist to  correct- pt stated his left leg "just went out"  ADL Comments: limited eval secondary to transport arrived to pick pt up for heart and vascular    OT Diagnosis: Generalized weakness;Acute pain  OT Problem List: Decreased strength;Decreased range of motion;Decreased activity tolerance;Impaired balance (sitting and/or standing);Decreased knowledge of use of DME or AE;Decreased knowledge of precautions;Pain;Impaired UE functional use;Impaired sensation OT Treatment Interventions: Self-care/ADL training;DME and/or AE instruction;Therapeutic activities;Patient/family education;Balance training   OT Goals Acute Rehab OT Goals OT Goal Formulation: With patient Time For Goal Achievement: 07/03/12 Potential to Achieve Goals: Fair ADL Goals Pt Will Perform Grooming: with modified independence;Standing at sink;Sitting at sink ADL Goal: Grooming - Progress: Goal set today Pt Will Perform Upper Body Dressing: with modified independence;Sitting, bed;Sitting, chair;Independently ADL Goal: Upper Body Dressing - Progress: Goal set today Pt Will Perform Lower Body Dressing: Independently;Sit to stand from bed;Sit to stand from chair ADL Goal: Lower Body Dressing - Progress: Goal set today Pt Will Transfer to Toilet: with modified independence;Ambulation;with DME ADL Goal: Toilet Transfer - Progress: Goal set today Pt Will Perform Toileting - Clothing Manipulation: Independently;Standing ADL Goal: Toileting - Clothing Manipulation - Progress: Goal set today Additional ADL Goal #1: Pt will be I with RUE HEP for strengthening in prep for ADLs ADL Goal: Additional Goal #1 - Progress: Goal set today  Visit Information  Last OT Received On: 06/19/12 Assistance Needed: +1    Subjective Data  Subjective: I was doing pretty good before this happened Patient Stated Goal: Return home with brother   Prior Functioning  Vision/Perception  Home Living Lives With:  (brother) Available Help at Discharge:   (brother Production designer, theatre/television/film at [employer] and works  various hours) Type of Home: Mobile home Home Access: Stairs to enter Entrance Stairs-Number of Steps: 6 Entrance Stairs-Rails: Left Home Layout: One level;Other (Comment) Bathroom Shower/Tub: Counselling psychologist: No Home Adaptive Equipment: Straight cane Additional Comments: pt states he was bathing at sink majority of week and would occasional sit down in tub to bathe with assist of brother Prior Function Needs Assistance: Light Housekeeping;Meal Prep Able to Take Stairs?: Yes Driving: Yes Vocation: On disability Comments: Pt states he sits to assist with meal prep and brother cooks meals; he assists with light housekeeping, brother vacuums and does "overhead" cleaning Communication Communication: No difficulties Dominant Hand: Right   Vision - Assessment Additional Comments: Pt states he has had blurry vision for ~2 weeks and attributed it to DM. Pt demonstrated good scanning of enviornment for necessary objects in all fields  Cognition  Overall Cognitive Status: Appears within functional limits for tasks assessed/performed Arousal/Alertness: Awake/alert Orientation Level: Oriented X4 / Intact Behavior During Session: [facility] for tasks performed    Extremity/Trunk Assessment Right Upper Extremity Assessment RUE ROM/Strength/Tone: Deficits RUE ROM/Strength/Tone Deficits: pt demonstrated 3+/5 shoulder flexion; 4+/5 shoulder extension and elbow flexion/extension; 4/5 grip. Also, pt able to achieve ~100 shoulder flexion- limited by weakness RUE Sensation: Deficits RUE Sensation Deficits: reports he "sometimes" has N/T in forearm RUE Coordination:  (appears [facility] for tasks assessed) Left Upper Extremity Assessment LUE ROM/Strength/Tone: Deficits LUE ROM/Strength/Tone Deficits: demonstrated decreased shoulder flexion until encouraged to try to reach higher; strength WFL except grip (4/5) LUE  Sensation: WFL - Light Touch LUE Coordination: WFL - gross/fine motor (for tasks assessed)   Mobility Transfers Sit to Stand: With upper extremity assist;From chair/3-in-1;With armrests;From toilet;4: Min guard Stand to Sit: 4: Min guard;To toilet;To chair/3-in-1;With armrests;With upper extremity assist   Exercise    Balance    End of Session OT - End of Session Equipment Utilized During Treatment: Gait belt Activity Tolerance: Patient tolerated treatment well Patient left:  (W/C with transport) Nurse Communication: Mobility status  GO     Candus Braud 06/19/2012, 10:47 AM

## 2012-06-19 NOTE — Progress Notes (Signed)
Pt had 4 runs of vtach. Pt under no s/s distress. MD made aware. Strips placed in chart. Will continue to monitor.

## 2012-06-19 NOTE — Clinical Social Work Psychosocial (Signed)
     Clinical Social Work Department BRIEF PSYCHOSOCIAL ASSESSMENT 06/19/2012  Patient:  BRAYLEN, STALLER     Account Number:  [id number]     Admit date:  06/17/2012  Clinical Social Worker:  Peggyann Shoals  Date/Time:  06/19/2012 04:36 PM  Referred by:  Physician  Date Referred:  06/19/2012 Referred for  SNF Placement   Other Referral:   Interview type:  Patient Other interview type:    PSYCHOSOCIAL DATA Living Status:  FAMILY Admitted from facility:   Level of care:   Primary support name:  Lawana Chambers Primary support relationship to patient:  SIBLING Degree of support available:   adequate    CURRENT CONCERNS Current Concerns  Post-Acute Placement   Other Concerns:    SOCIAL WORK ASSESSMENT / PLAN CSW met with pt to address cosnult. CSW introduced herself and explained role of social work. Pt lives with brother in [place] and feels that he woul benefit from ST-SNF. Pt has been to SNF in the past. CSW explained process of SNF. CSW will initiate SNF search and follow up with bed offers. CSW will continue to follow.   Assessment/plan status:  Psychosocial Support/Ongoing Assessment of Needs Other assessment/ plan:   Information/referral to community resources:   SNF List    PATIENTS/FAMILYS RESPONSE TO PLAN OF CARE: Pt was alert and oriented. Pt is agreeable to SNF at discharge.

## 2012-06-19 NOTE — Progress Notes (Signed)
Subjective: Patient requesting transfer to skilled nursing facility. He states that he would not allow home health to come into his home under any circumstances. He has no other complaints. I discussed with the patient that Dr. Johney Frame will arrange for Dr. Ladona Ridgel to revise his pacemaker pocket at a later date.  Interval history: I discussed with neurology but they will do repeat CT scan of his brain today. Felicie Morn from neurology will also obtain results of the echocardiogram that was done in an outpatient setting to review further necessity for repeat echo here in the hospital Objective: Filed Vitals:   06/19/12 0600 06/19/12 0957 06/19/12 1549 06/19/12 1847  BP: 99/48 111/61 115/65 120/68  Pulse: 69 70 76 78  Temp: 97.9 F (36.6 C) 97.9 F (36.6 C) 97.5 F (36.4 C) 97.8 F (36.6 C)  TempSrc:  Oral Oral Oral  Resp: 18 18 18 18   Height:      Weight:      SpO2: 99% 100% 100% 100%   Weight change:   Intake/Output Summary (Last 24 hours) at 06/19/12 2002 Last data filed at 06/19/12 0700  Gross per 24 hour  Intake      0 ml  Output    500 ml  Net   -500 ml    General: Alert, awake, oriented x3, in no acute distress.  Heart: Regular rate and rhythm, without murmurs, rubs, gallops.  Lungs: Clear to auscultation. Abdomen: Soft, nontender, nondistended, positive bowel sounds, no masses no hepatosplenomegaly noted..  Neuro: No focal neurological deficits noted cranial nerves II through XII grossly intact.      Lab Results:  [facility] 06/17/12 1156 06/17/12 1138  NA 134* 131*  K 4.2 4.1  CL 97 93*  CO2 -- 28  GLUCOSE 213* 219*  BUN 35* 33*  CREATININE 2.20* 2.36*  CALCIUM -- 9.3  MG -- --  PHOS -- --    Basename 06/17/12 1138  AST 43*  ALT 33  ALKPHOS 70  BILITOT 0.7  PROT 7.4  ALBUMIN 3.9   No results found for this basename: LIPASE:2,AMYLASE:2 in the last 72 hours  Basename 06/17/12 1156 06/17/12 1138  WBC -- 3.3*  NEUTROABS -- 2.7  HGB 11.9* 11.5*  HCT  35.0* 33.2*  MCV -- 86.7  PLT -- 113*    Basename 06/17/12 1138  CKTOTAL 147  CKMB 4.3*  CKMBINDEX --  TROPONINI <0.30   No components found with this basename: POCBNP:3 No results found for this basename: DDIMER:2 in the last 72 hours  Basename 06/18/12 0641  HGBA1C 7.5*    Basename 06/18/12 0641  CHOL 94  HDL 26*  LDLCALC 22  TRIG 161*  CHOLHDL 3.6  LDLDIRECT --   No results found for this basename: TSH,T4TOTAL,FREET3,T3FREE,THYROIDAB in the last 72 hours No results found for this basename: VITAMINB12:2,FOLATE:2,FERRITIN:2,TIBC:2,IRON:2,RETICCTPCT:2 in the last 72 hours  Micro Results: Recent Results (from the past 240 hour(s))  CULTURE, BLOOD (ROUTINE X 2)     Status: Normal (Preliminary result)   Collection Time   06/18/12  3:38 PM      Component Value Range Status Comment   Specimen Description BLOOD RIGHT ARM   Final    Special Requests BOTTLES DRAWN AEROBIC AND ANAEROBIC 10CC   Final    Culture  Setup Time 06/18/2012 23:52   Final    Culture     Final    Value:        BLOOD CULTURE RECEIVED NO GROWTH TO DATE CULTURE WILL BE  HELD FOR 5 DAYS BEFORE ISSUING A FINAL NEGATIVE REPORT   Report Status PENDING   Incomplete   CULTURE, BLOOD (ROUTINE X 2)     Status: Normal (Preliminary result)   Collection Time   06/18/12  3:44 PM      Component Value Range Status Comment   Specimen Description BLOOD LEFT ARM   Final    Special Requests BOTTLES DRAWN AEROBIC AND ANAEROBIC 10CC   Final    Culture  Setup Time 06/18/2012 23:52   Final    Culture     Final    Value:        BLOOD CULTURE RECEIVED NO GROWTH TO DATE CULTURE WILL BE HELD FOR 5 DAYS BEFORE ISSUING A FINAL NEGATIVE REPORT   Report Status PENDING   Incomplete     Studies/Results: Ct Head Wo Contrast  06/19/2012  *RADIOLOGY REPORT*  Clinical Data: Follow-up stroke.  Leg weakness.  CT HEAD WITHOUT CONTRAST  Technique:  Contiguous axial images were obtained from the base of the skull through the vertex without  contrast.  Comparison: None.  Findings: No definite acute cortical infarct, intracranial hemorrhage, mass lesion, hydrocephalus, or extra-axial fluid.  Mild atrophy with chronic microvascular ischemic change.  Old right cerebellar infarct.  Calvarium intact.  Clear sinuses and mastoids. Proximal vascular calcification.  No change from 07/22.  IMPRESSION: Chronic changes as described.  No acute cortical infarct or bleed clearly involving the left hemisphere motor strip subserving the left leg.  Consider repeat CT scanning.  The patient is not a candidate for MR due to pacemaker.  Original Report Authenticated By: Elsie Stain, M.D.   Ct Head Wo Contrast  06/17/2012  *RADIOLOGY REPORT*  Clinical Data: Code stroke  CT HEAD WITHOUT CONTRAST  Technique:  Contiguous axial images were obtained from the base of the skull through the vertex without contrast.  Comparison: 11/06/2010  Findings: There is no evidence for acute hemorrhage, hydrocephalus, mass lesion, or abnormal extra-axial fluid collection.  No definite CT evidence for acute infarction.  Patchy low attenuation in the deep hemispheric and periventricular white matter is nonspecific, but likely reflects chronic microvascular ischemic demyelination. Right inferior cerebellar encephalomalacia is stable.  The visualized paranasal sinuses and mastoid air cells are clear.  IMPRESSION: Stable.  No acute intracranial abnormality.  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  I discussed these findings by telephone with Dr. Thad Ranger at approximately 1140 hours on 06/17/2012.  Original Report Authenticated By: ERIC A. MANSELL, M.D.    Medications: I have reviewed the patient's current medications. Scheduled Meds:   . amiodarone  200 mg Oral BID  . antiseptic oral rinse  15 mL Mouth Rinse q12n4p  . carvedilol  9.375 mg Oral BID WC  . colchicine  0.6 mg Oral Daily  . donepezil  5 mg Oral QHS  . enoxaparin (LOVENOX)  injection  40 mg Subcutaneous Q24H  . febuxostat  40 mg Oral Daily  . fenofibrate  160 mg Oral Daily  . gabapentin  300 mg Oral TID  . hydrALAZINE  25 mg Oral TID  . HYDROcodone-acetaminophen  1 tablet Oral QHS  . insulin aspart  0-5 Units Subcutaneous QHS  . insulin aspart  0-9 Units Subcutaneous TID WC  . insulin aspart  4 Units Subcutaneous TID WC  . insulin glargine  20 Units Subcutaneous QHS  . isosorbide mononitrate  90 mg Oral QHS  . magnesium oxide  400 mg Oral BID  . pantoprazole  40 mg Oral Q1200  . Tamsulosin HCl  0.4 mg Oral Daily  . Ticagrelor  90 mg Oral BID  . vitamin B-12  250 mcg Oral Daily   Continuous Infusions:  PRN Meds:.nitroGLYCERIN, senna-docusate Assessment/Plan: Patient Active Hospital Problem List: 1. status post remote pacemaker placement: Patient has swelling of the pacemaker pocket however no evidence of infection. I appreciate cardiology input. Will defer to cardiology we'll arrange for removal of pacemaker as an outpatient.  2. Chronic kidney disease stage III: Aldactone stopped during his hospitalization.  3. Diabetes type 2: Patient had an episode of hypoglycemia and sulfonylurea was stopped and Lantus decreased. Blood sugars have been in acceptable range today.  4. Right-sided numbness and weakness: Initial CT did not show evidence of stroke. The patient will have repeat CT done today. Per neurology his workup is complete except for there evaluation of the echo findings of echo done as an outpatient.  5. Disposition: The patient is requesting placement to skilled nursing facility and social work is facilitating this. The patient is medically stable for discharge and to be discharged to a skilled nursing facility when bed available.  LOS: 2 days

## 2012-06-19 NOTE — Clinical Social Work Placement (Signed)
    Clinical Social Work Department CLINICAL SOCIAL WORK PLACEMENT NOTE 06/19/2012  Patient:  [name], [name]  Account Number:  [id number] Admit date:  06/17/2012  Clinical Social Worker:  Peggyann Shoals  Date/time:  06/19/2012 04:41 PM  Clinical Social Work is seeking post-discharge placement for this patient at the following level of care:   SKILLED NURSING   (*CSW will update this form in Epic as items are completed)   06/19/2012  Patient/family provided with [facility] Department of Clinical Social Work's list of facilities offering this level of care within the geographic area requested by the patient (or if unable, by the patient's family).  06/19/2012  Patient/family informed of their freedom to choose among providers that offer the needed level of care, that participate in Medicare, Medicaid or managed care program needed by the patient, have an available bed and are willing to accept the patient.  06/19/2012  Patient/family informed of [facility]' ownership interest in [facility], as well as of the fact that they are under no obligation to receive care at this facility.  PASARR submitted to EDS on 10/25/2009 PASARR number received from [employer] on 10/25/2009  FL2 transmitted to all facilities in geographic area requested by pt/family on  06/19/2012 FL2 transmitted to all facilities within larger geographic area on   Patient informed that his/her managed care company has contracts with or will negotiate with  certain facilities, including the following:     Patient/family informed of bed offers received:  06/20/2012 Patient chooses bed at  Physician recommends and patient chooses bed at    Patient to be transferred to  on   Patient to be transferred to facility by   The following physician request were entered in Epic:   Additional Comments: Pt declined SNF placement in favor of discharging home. RNCM and MD are aware. CSW signed off.   Dede Query,  MSW, [facility] [phone]

## 2012-06-19 NOTE — Progress Notes (Signed)
Physical Therapy Treatment Patient Details Name: [name] MRN: [id number] DOB: Feb 03, 1956 Today's Date: 06/19/2012 Time: 0812-0830 PT Time Calculation (min): 18 min  PT Assessment / Plan / Recommendation Comments on Treatment Session  Pt admitted with CVA and continues to work with therapy to increase independence and safety.  Pt requiring min assist this am due to right sided weakness with knee buckling throughout.    Follow Up Recommendations  Skilled nursing facility;Supervision for mobility/OOB    Barriers to Discharge        Equipment Recommendations  Defer to next venue    Recommendations for Other Services    Frequency Min 3X/week   Plan Discharge plan remains appropriate;Frequency remains appropriate    Precautions / Restrictions Precautions Precautions: Fall Precaution Comments: Pt with h/o numerous falls per pt Required Braces or Orthoses: Other Brace/Splint Other Brace/Splint: Post-op shoe on Lt due to Charcot foot Restrictions Weight Bearing Restrictions: No   Pertinent Vitals/Pain None    Mobility  Bed Mobility Bed Mobility: Not assessed Transfers Transfers: Sit to Stand;Stand to Sit Sit to Stand: 4: Min assist;With upper extremity assist;From chair/3-in-1 Stand to Sit: 4: Min assist;With upper extremity assist;To chair/3-in-1 Details for Transfer Assistance: Pt requiring slight assist today due to weakness with lean to right during sit to/from stand.  Cues for safest hand placement. Ambulation/Gait Ambulation/Gait Assistance: 4: Min assist Ambulation Distance (Feet): 25 Feet Assistive device: Rolling walker (Attempted cane, but required RW for safety today.) Ambulation/Gait Assistance Details: Assist due to right knee buckling with gait today with attempted use of cane, but requiring RW for safety.  Right knee continued buckling requiring assist to correct today.  Cues for tall posture and safety as well as encouragement to continue. Gait Pattern:  Step-to pattern;Decreased stride length;Decreased hip/knee flexion - right;Decreased hip/knee flexion - left;Right foot flat;Left foot flat;Wide base of support Gait velocity: severely impaired Stairs: No Wheelchair Mobility Wheelchair Mobility: No Modified Rankin (Stroke Patients Only) Pre-Morbid Rankin Score: Moderate disability Modified Rankin: Moderately severe disability    Exercises     PT Diagnosis:    PT Problem List:   PT Treatment Interventions:     PT Goals Acute Rehab PT Goals PT Goal Formulation: With patient Time For Goal Achievement: 06/25/12 Potential to Achieve Goals: Good PT Goal: Sit to Stand - Progress: Progressing toward goal PT Goal: Ambulate - Progress: Progressing toward goal  Visit Information  Last PT Received On: 06/19/12 Assistance Needed: +1    Subjective Data  Subjective: "I'm glad you came by.  My legs aren't working real well today." Patient Stated Goal: Go to skilled rehab   Cognition  Overall Cognitive Status: Appears within functional limits for tasks assessed/performed Arousal/Alertness: Awake/alert Orientation Level: Oriented X4 / Intact Behavior During Session: [facility] for tasks performed    Balance  Balance Balance Assessed: No  End of Session PT - End of Session Equipment Utilized During Treatment: Gait belt;Other (comment) (Left post-op shoe.) Activity Tolerance: Patient tolerated treatment well;Patient limited by fatigue Patient left: in chair;with call bell/phone within reach Nurse Communication: Mobility status   GP     Cephus Shelling 06/19/2012, 1:47 PM  06/19/2012 Cephus Shelling, PT, DPT [phone]

## 2012-06-19 NOTE — Progress Notes (Addendum)
Pt. Seen and examined. Agree with the NP/PA-C note as written.  He is very tearful. Upset about his leg weakness and thoughts about not being able to walk. From a cardiac standpoint, we have a plan for AICD evaluation for lead extraction with Dr. Ladona Ridgel in place when he returns. Depsite the high risk, he wants it done.  Main issue is with specific facility placement at this time. Will defer to hospital medicine. Will sign-off. Please call us for any further cardiac issues.  Chrystie Nose, MD, [facility] Attending Cardiologist [facility]

## 2012-06-19 NOTE — Progress Notes (Signed)
Speech Language Pathology Dysphagia Treatment Patient Details Name: [name] MRN: [id number] DOB: 1956-11-09 Today's Date: 06/19/2012 Time: 8413-2440 SLP Time Calculation (min): 14 min  Assessment / Plan / Recommendation Clinical Impression  Treatment focused on diagnostic-treatment of swallow function to ensure no s/s of aspiration.  Bedside swallow assessment on 7/23 revealed overall normal function with recommendation for a 1 time f/u.  Patient continues to demonstrate a functional oral-pharyngeal swallow and independence with PO diet.  Evidence of very mild hesitations related to word finding and through organization per SLP interview with patient.  Patient denied this was new and reported is has improved since admission (may have not been present during SLP evaluation yesterday).  No further skilled SLP services at this time.    Diet Recommendation  Continue with Current Diet: Regular;Thin liquid    SLP Plan Discharge SLP treatment due to (comment);All goals met   Pertinent Vitals/Pain n/a   Swallowing Goals  SLP Swallowing Goals Patient will consume recommended diet without observed clinical signs of aspiration with: Independent assistance Swallow Study Goal #1 - Progress: Met Patient will utilize recommended strategies during swallow to increase swallowing safety with: Independent assistance Swallow Study Goal #2 - Progress: Met  General Temperature Spikes Noted: No Respiratory Status: Room air Behavior/Cognition: Alert Oral Cavity - Dentition: Adequate natural dentition Patient Positioning: Upright in chair  Oral Cavity - Oral Hygiene Does patient have any of the following "at risk" factors?: None of the above   Dysphagia Treatment Treatment focused on: Skilled observation of diet tolerance Treatment Methods/Modalities: Skilled observation Patient observed directly with PO's: Yes Type of PO's observed: Regular;Thin liquids Feeding: Able to feed self Liquids  provided via: Cup   Myra Rude, M.S.,CCC-SLP Pager [phone] 06/19/2012, 11:59 AM

## 2012-06-19 NOTE — Progress Notes (Addendum)
[facility] PROGRESS NOTE    SUBJECTIVE   Patient feels he is still weak in his right leg but feeling better.   OBJECTIVE   Vital signs in last 24 hours: Temp:  [97.9 F (36.6 C)-98.3 F (36.8 C)] 97.9 F (36.6 C) (07/24 0600) Pulse Rate:  [69-77] 69  (07/24 0600) Resp:  [18-20] 18  (07/24 0600) BP: (94-120)/(47-70) 99/48 mmHg (07/24 0600) SpO2:  [98 %-99 %] 99 % (07/24 0600) FiO2 (%):  [99.1 %] 99.1 % (07/24 0200) Weight:  [96.163 kg (212 lb)] 96.163 kg (212 lb) (07/23 1112)  Intake/Output from previous day: 07/23 0701 - 07/24 0700 In: 540 [P.O.:540] Out: 825 [Urine:825] Intake/Output this shift:   Nutritional status: Carb Control  Past Medical History  Diagnosis Date  . Diabetes mellitus   . Hypertension   . GERD (gastroesophageal reflux disease)   . Dyslipidemia   . Stroke   . CHF (congestive heart failure)   . Atrial fibrillation   . Asthma   . Pneumonia   . Chronic kidney disease, stage 3, mod decreased GFR     Neurologic Exam:  Mental Status:  Alert, oriented X 3. Speech slow but fluent without evidence of aphasia. Able to follow 3 step commands without difficulty.  Mood: Depressed  Memory: intact  Thought content appropriate  Cranial Nerves:  II-Visual fields grossly intact.  III/IV/VI-Extraocular movements intact. Pupils reactive bilaterally. Ptosis not present.  V/VII-Smile symmetric but patient gives poor effort when asked to smile  VIII-decreased on right and continue to split midline in face and sternum  IX/X-normal gag  XI-bilateral shoulder shrug  XII-midline tongue extension  Motor: 5/5 bilaterally with normal tone and bulk with poor effort given during exam  Sensory: Pinprick and light touch intact throughout, bilaterally  Deep Tendon Reflexes: 2+ and symmetric upper extremities and negligible in LE. No achilles reflex noted bilaterally  Plantars: Right: mute Left: mute  Cerebellar: Normal  finger-to-nose, normal heel-to-shin test.    Lab Results: Lab Results  Component Value Date/Time   CHOL 94 06/18/2012  6:41 AM   Lipid Panel  Basename 06/18/12 0641  CHOL 94  TRIG 232*  HDL 26*  CHOLHDL 3.6  VLDL 46*  LDLCALC 22    Studies/Results: Ct Head Wo Contrast  06/17/2012  *RADIOLOGY REPORT*  Clinical Data: Code stroke  CT HEAD WITHOUT CONTRAST  Technique:  Contiguous axial images were obtained from the base of the skull through the vertex without contrast.  Comparison: 11/06/2010  Findings: There is no evidence for acute hemorrhage, hydrocephalus, mass lesion, or abnormal extra-axial fluid collection.  No definite CT evidence for acute infarction.  Patchy low attenuation in the deep hemispheric and periventricular white matter is nonspecific, but likely reflects chronic microvascular ischemic demyelination. Right inferior cerebellar encephalomalacia is stable.  The visualized paranasal sinuses and mastoid air cells are clear.  IMPRESSION: Stable.  No acute intracranial abnormality.  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  I discussed these findings by telephone with Dr. Thad Ranger at approximately 1140 hours on 06/17/2012.  Original Report Authenticated By: ERIC A. MANSELL, M.D.   Carotid duplex completed.  Preliminary report: Mild calcific plaque noted in right proximal ICA. No significant plaque noted on left. No significant ICA stenosis noted bilaterally  Medications:     Scheduled:   . amiodarone  200 mg Oral BID  . antiseptic oral rinse  15 mL Mouth Rinse q12n4p  . carvedilol  9.375 mg Oral BID WC  . colchicine  0.6 mg Oral Daily  . donepezil  5 mg Oral QHS  . enoxaparin (LOVENOX) injection  40 mg Subcutaneous Q24H  . febuxostat  40 mg Oral Daily  . fenofibrate  160 mg Oral Daily  . gabapentin  300 mg Oral TID  . hydrALAZINE  25 mg Oral TID  . HYDROcodone-acetaminophen  1 tablet Oral QHS  . insulin aspart  0-5  Units Subcutaneous QHS  . insulin aspart  0-9 Units Subcutaneous TID WC  . insulin aspart  4 Units Subcutaneous TID WC  . insulin glargine  20 Units Subcutaneous QHS  . isosorbide mononitrate  90 mg Oral QHS  . magnesium oxide  400 mg Oral BID  . pantoprazole  40 mg Oral Q1200  . Tamsulosin HCl  0.4 mg Oral Daily  . Ticagrelor  90 mg Oral BID  . vitamin B-12  250 mcg Oral Daily    Assessment/Plan:   56 yo male presenting as a code stoke with complaints of right sided numbness and weakness. Patient's finding on exam continue to appear effort related at this time but patient has multiple risk factors that inclue DM, HTN, hyperlipidemia, atrial fibrillation and stroke in the past. He states he is not on ASA daily due to interactions with Uloric medication he takes for gout. He is on Brilinta currently. HbA1c 7.5, Dopplers show no ICA stenosis, LDL is 22 .   1. Echocardiogram --pending (patient states he had one done one week ago Wednesday and the report was faxed to [facility] hospital yesterday.  Unfortunately I cannot find this in his chart.) we have called [facility] at [phone] and they are re-faxing the results to [facility] 2. Repeat head CT today 3. Prophylactic therapy-currently on Brilinta  4. Risk factor modification  5. Telemetry monitoring  6. Frequent neuro checks    Felicie Morn PA-C [facility] [phone]  06/19/2012, 8:35 AM

## 2012-06-20 ENCOUNTER — Telehealth: Payer: Self-pay | Admitting: *Deleted

## 2012-06-20 ENCOUNTER — Ambulatory Visit (HOSPITAL_COMMUNITY): Admission: RE | Admit: 2012-06-20 | Payer: Medicare Other | Source: Ambulatory Visit

## 2012-06-20 DIAGNOSIS — R509 Fever, unspecified: Secondary | ICD-10-CM | POA: Diagnosis not present

## 2012-06-20 LAB — GLUCOSE, CAPILLARY
Glucose-Capillary: 121 mg/dL — ABNORMAL HIGH (ref 70–99)
Glucose-Capillary: 176 mg/dL — ABNORMAL HIGH (ref 70–99)

## 2012-06-20 MED ORDER — INSULIN GLARGINE 100 UNIT/ML ~~LOC~~ SOLN: 20.0000 [IU] | Freq: Every day | SUBCUTANEOUS | Status: DC

## 2012-06-20 NOTE — Progress Notes (Signed)
Patient [name], 56 year old white male is visibly uncomfortable as he prepares to discharge from the hospital today.  Patient reports that he "will return to the heart unit Wednesday."  Patient expressed appreciation for Chaplain's provision of pastoral prayers, presence, and conversation.  No follow up needed.

## 2012-06-20 NOTE — Progress Notes (Signed)
Pt decided to leave AMA. MD made aware. Both MD and RN spoke with patient concerning the risk of leaving. Pt still determined to leave. Pt signed form. Pt iv dc intact. Pt was shivering and complaining of being cold before leaving. Pt wheeled by nurse tech.

## 2012-06-20 NOTE — Progress Notes (Signed)
Pt had fever. RN notified by tech. RN spoke with MD face to face about temp. Pt wants to leave AMA without further treatment.

## 2012-06-20 NOTE — Telephone Encounter (Signed)
Patient to be scheduled for ICD system extraction with Dr Ladona Ridgel.  Before patient was discharged today, we offered next Wednesday, July 31st as a tentative date.  Spoke with Dee at [employer]. She is checking availability in the hybrid OR for that afternoon.  She will let me know by Monday if that will be available.    I attempted to call patient to update him on status of scheduling.  There was no answer at his home phone.  Will attempt to call again when I hear back from [facility].

## 2012-06-20 NOTE — Clinical Social Work Note (Signed)
CSW provided bed offers to pt. Pt shared that he has changed his mind and would like to discharge home. CSW explained that he would be able to enter a SNF from home and explained the process of working with his PCP. Pt shared that he was thankful for the CSW intervention. CSW notified RNCM and MD of discharge plan change. CSW is signing off as no further needs identified.   Dede Query, MSW, [facility] [phone]

## 2012-06-20 NOTE — Consult Note (Signed)
  ELECTROPHYSIOLOGY CONSULT NOTE  Patient ID: Ebb E Brinlee MRN: [id number], DOB/AGE: 12/12/1955   Admit date: 06/17/2012 Date of Consult: 06/20/2012  Primary Physician: HAGUE, IMRAN P, MD Primary Cardiologist: Allred, MD Reason for Consultation: Possible BiV ICD pocket infection  History of Present Illness Mr. Dunkley is a 56-year-old gentleman with an ischemic CM, prior VF arrest s/p ICD implantation in 2006 with upgrade to BiV ICD with CRT in January 2011, severe CAD, insulin-dependent diabetes, hyperlipidemia, hypertension, chronic kidney disease-stage III, atrial fibrillation, stroke and asthma who has been admitted with right sided weakness and is undergoing neurologic work-up to r/o CVA. While here, he reports pain and swelling at his ICD implant site. This has been evaluated as well by his primary cardiologist who has requested we provide further EP recommendations.   His ICD was originally implanted in April 2006. He has been hospitalized in the past with multiple ICD shocks. Most recently, his ejection fraction is improved to 35-40%. In January 2011, he underwent BiV ICD generator change and RV lead revision by Dr. Simmons at [facility]. According to his records, he developed a hematoma post-operatively which resolved over time without intervention. Mr. Berthold states his implant site "has never been right" since this procedure. But more recently he has noticed increasing pain and swelling at the site x 2 months. He also reports "a boil" over the lateral edge about 2 months ago which he drained at home by himself. He was given oral antibiotics by his PCP and scheduled for follow-up at [facility]. He was seen by Dr. Weintraub on 05/23/2012.   He denies chest pain, shortness of breath, fever, abdominal pain, palpitations, dizziness, near syncope or syncope. He denies lower extremity swelling, cough, orthopnea or PND.   Past Medical History  Diagnosis Date  . Diabetes  mellitus with peripheral neuropathy   . Hypertension   . GERD (gastroesophageal reflux disease)   . Dyslipidemia   . Stroke 1996   . CHF (congestive heart failure)   . Atrial fibrillation   . Asthma   . Pneumonia   . Chronic kidney disease, stage III   . Hepatitis C   . Prior toe amputation secondary to osteomyelitis   . Ischemic cardiomyopathy  s/p ICD implantation 2006. s/p BiV ICD upgrade January 2011. At that time, required balloon venoplasty due to subclavian vein stenosis.   . Multivessel CAD s/p anterior MI in 1998. s/p LAD and PDA intervention in 2009. In-stent restenosis with a staged intervention to sites in  November 2011. Presented in March 2012 with VT and was found to have an occluded LAD, the PDA was patent s/p intervention with cutting balloon and angioplasty at multiple sites in the LAD     Allergies/Intolerances Allergies  Allergen Reactions  . Cephalosporins Nausea Only  . Morphine And Related Other (See Comments)    hypotension  . Penicillins Other (See Comments)    Reaction as a child -unknown    Inpatient Medications . amiodarone  200 mg Oral BID  . antiseptic oral rinse  15 mL Mouth Rinse q12n4p  . carvedilol  9.375 mg Oral BID WC  . colchicine  0.6 mg Oral Daily  . donepezil  5 mg Oral QHS  . enoxaparin (LOVENOX) injection  40 mg Subcutaneous Q24H  . febuxostat  40 mg Oral Daily  . fenofibrate  160 mg Oral Daily  . gabapentin  300 mg Oral TID  . hydrALAZINE  25 mg Oral TID  . HYDROcodone-acetaminophen  1   tablet Oral QHS  . insulin aspart  0-5 Units Subcutaneous QHS  . insulin aspart  0-9 Units Subcutaneous TID WC  . insulin aspart  4 Units Subcutaneous TID WC  . insulin glargine  20 Units Subcutaneous QHS  . isosorbide mononitrate  90 mg Oral QHS  . magnesium oxide  400 mg Oral BID  . pantoprazole  40 mg Oral Q1200  . Tamsulosin HCl  0.4 mg Oral Daily  . Ticagrelor  90 mg Oral BID  . vitamin B-12  250 mcg Oral Daily   Family History Positive  for premature CAD   Social History Social History  . Marital Status: Divorced   Social History Main Topics  . Smoking status: Never Smoker   . Smokeless tobacco: Not on file  . Alcohol Use: No  . Drug Use: No   Review of Systems General:  No chills, fever, night sweats or weight changes  Cardiovascular:  No chest pain, dyspnea on exertion, edema, orthopnea, palpitations, paroxysmal nocturnal dyspnea Dermatological: No rash, lesions or masses Respiratory: No cough, dyspnea Urologic: No hematuria, dysuria Abdominal: No nausea, vomiting, diarrhea, bright red blood per rectum, melena, or hematemesis Neurologic: Positive for weakness. No visual changes or changes in mental status All other systems reviewed and are otherwise negative except as noted above.  Physical Exam Blood pressure 125/64, pulse 96, temperature 102.2 F (39 C), temperature source Oral, resp. rate 16, height 5' 9.5" (1.765 m), weight 212 lb (96.163 kg), SpO2 98.00%.  General: Well developed, well appearing 56 year old male in no acute distress. HEENT: Normocephalic, atraumatic. EOMs intact. Sclera nonicteric. Oropharynx clear.  Neck: Supple. No JVD. Lungs: Respirations regular and unlabored, CTA bilaterally. No wheezes, rales or rhonchi. Heart: RRR. S1, S2 present. No murmurs, rub, S3 or S4. Abdomen: Soft, non-tender, non-distended. BS present x 4 quadrants. No hepatosplenomegaly.  Extremities: No clubbing, cyanosis or edema. DP/PT/Radials 2+ and equal bilaterally. Psych: Normal affect. Neuro: Alert and oriented X 3. Moves all extremities spontaneously. Skin: Left upper chest/ICD implant site intact with no active bleeding or hematoma. There is no warmth or drainage. The lateral edge is adhered to skin and non-mobile and thin. There is tenderness to palpation of site.   Labs  Basename 06/17/12 1138  CKTOTAL 147  CKMB 4.3*  TROPONINI <0.30   Lab Results  Component Value Date   WBC 3.3* 06/17/2012   HGB 11.9*  06/17/2012   HCT 35.0* 06/17/2012   MCV 86.7 06/17/2012   PLT 113* 06/17/2012     Lab 06/17/12 1156 06/17/12 1138  NA 134* --  K 4.2 --  CL 97 --  CO2 -- 28  BUN 35* --  CREATININE 2.20* --  CALCIUM -- 9.3  PROT -- 7.4  BILITOT -- 0.7  ALKPHOS -- 70  ALT -- 33  AST -- 43*  GLUCOSE 213* --   No components found with this basename: MAGNESIUM No components found with this basename: POCBNP:3 No results found for this basename: DDIMER   No results found for this basename: TSH,T4TOTAL,FREET3,T3FREE,THYROIDAB in the last 72 hours   Basename 06/17/12 1138  INR 1.17    Radiology/Studies Ct Head Wo Contrast  06/17/2012  *RADIOLOGY REPORT*  Clinical Data: Code stroke  CT HEAD WITHOUT CONTRAST  Technique:  Contiguous axial images were obtained from the base of the skull through the vertex without contrast.  Comparison: 11/06/2010  Findings: There is no evidence for acute hemorrhage, hydrocephalus, mass lesion, or abnormal extra-axial fluid collection.  No definite   CT evidence for acute infarction.  Patchy low attenuation in the deep hemispheric and periventricular white matter is nonspecific, but likely reflects chronic microvascular ischemic demyelination. Right inferior cerebellar encephalomalacia is stable.  The visualized paranasal sinuses and mastoid air cells are clear.  IMPRESSION: Stable.  No acute intracranial abnormality.  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  I discussed these findings by telephone with Dr. Reynolds at approximately 1140 hours on 06/17/2012.  Original Report Authenticated By: ERIC A. MANSELL, M.D.   Echocardiogram  pending  Assessment and Plan 1. ICD pocket infection - he is afebrile and hemodynamically stable; agree with blood cultures and echocardiogram; will check a sedimentation rate 2. ICM 3. Chronic systolic CHF Rec: I have discussed the treatment options with the patient and have recommended ICD lead  extraction, placement of a life vest and re-insertion after 3-4 weeks.  Gregg Taylor, MD 06/20/2012 11:21 AM   

## 2012-06-20 NOTE — Discharge Summary (Signed)
[name] MRN: [id number] DOB/AGE: 06/07/1956 56 y.o.  Admit date: 06/17/2012 Discharge date: 06/20/2012  Primary Care Physician:  Galvin Proffer, MD   Discharge Diagnoses:   Patient Active Problem List  Diagnosis  . Hemiplegia, unspecified, affecting dominant side  . Fever  . PAF (paroxysmal atrial fibrillation), appears to be in NSR, POD  . Dyslipidemia  . Hypertension  . Chronic kidney disease, stage 3-4, mod decreased GFR  . Vomiting  . Diarrhea  . Hypoglycemia associated with diabetes  . ICD (implantable cardiac defibrillator) infection, chronic, seen by EP this admission.  . Ventricular tachyarrhythmia, appropriate ICD discharges this year  . Ischemic cardiomyopathy, he had been a responder to BiV ICD    DISCHARGE MEDICATION: Medication List  As of 06/20/2012 10:29 AM   STOP taking these medications         furosemide 40 MG tablet      glipiZIDE 5 MG tablet      linezolid 600 MG tablet      spironolactone 25 MG tablet         TAKE these medications         amiodarone 200 MG tablet   Commonly known as: PACERONE   Take 200 mg by mouth 2 (two) times daily.      carvedilol 6.25 MG tablet   Commonly known as: COREG   Take 9.375 mg by mouth 2 (two) times daily with a meal.      colchicine 0.6 MG tablet   Take 0.6 mg by mouth daily.      donepezil 5 MG tablet   Commonly known as: ARICEPT   Take 5 mg by mouth at bedtime.      febuxostat 40 MG tablet   Commonly known as: ULORIC   Take 40 mg by mouth daily.      fenofibrate 145 MG tablet   Commonly known as: TRICOR   Take 145 mg by mouth daily.      gabapentin 300 MG capsule   Commonly known as: NEURONTIN   Take 300 mg by mouth 3 (three) times daily.      hydrALAZINE 25 MG tablet   Commonly known as: APRESOLINE   Take 25 mg by mouth 3 (three) times daily.      HYDROcodone-acetaminophen 5-500 MG per tablet   Commonly known as: VICODIN   Take 1 tablet by mouth at bedtime.      insulin aspart  100 UNIT/ML injection   Commonly known as: novoLOG   Inject 1-12 Units into the skin 3 (three) times daily before meals.      insulin glargine 100 UNIT/ML injection   Commonly known as: LANTUS   Inject 20 Units into the skin at bedtime.      isosorbide mononitrate 60 MG 24 hr tablet   Commonly known as: IMDUR   Take 90 mg by mouth at bedtime.      magnesium oxide 400 MG tablet   Commonly known as: MAG-OX   Take 400 mg by mouth 2 (two) times daily.      nitroGLYCERIN 0.4 MG SL tablet   Commonly known as: NITROSTAT   Place 0.4 mg under the tongue every 5 (five) minutes as needed. For chest pain      omeprazole 20 MG capsule   Commonly known as: PRILOSEC   Take 40 mg by mouth 2 (two) times daily.      Tamsulosin HCl 0.4 MG Caps   Commonly known as: FLOMAX  Take 0.4 mg by mouth daily.      Ticagrelor 90 MG Tabs tablet   Commonly known as: BRILINTA   Take 90 mg by mouth 2 (two) times daily.      vitamin B-12 250 MCG tablet   Commonly known as: CYANOCOBALAMIN   Take 250 mcg by mouth daily.              Consults: Treatment Team:  Marinus Maw, MD   SIGNIFICANT DIAGNOSTIC STUDIES:  Ct Head Wo Contrast  06/19/2012  *RADIOLOGY REPORT*  Clinical Data: Follow-up stroke.  Leg weakness.  CT HEAD WITHOUT CONTRAST  Technique:  Contiguous axial images were obtained from the base of the skull through the vertex without contrast.  Comparison: None.  Findings: No definite acute cortical infarct, intracranial hemorrhage, mass lesion, hydrocephalus, or extra-axial fluid.  Mild atrophy with chronic microvascular ischemic change.  Old right cerebellar infarct.  Calvarium intact.  Clear sinuses and mastoids. Proximal vascular calcification.  No change from 07/22.  IMPRESSION: Chronic changes as described.  No acute cortical infarct or bleed clearly involving the left hemisphere motor strip subserving the left leg.  Consider repeat CT scanning.  The patient is not a candidate for Brandon due to  pacemaker.  Original Report Authenticated By: Elsie Stain, M.D.   Ct Head Wo Contrast  06/17/2012  *RADIOLOGY REPORT*  Clinical Data: Code stroke  CT HEAD WITHOUT CONTRAST  Technique:  Contiguous axial images were obtained from the base of the skull through the vertex without contrast.  Comparison: 11/06/2010  Findings: There is no evidence for acute hemorrhage, hydrocephalus, mass lesion, or abnormal extra-axial fluid collection.  No definite CT evidence for acute infarction.  Patchy low attenuation in the deep hemispheric and periventricular white matter is nonspecific, but likely reflects chronic microvascular ischemic demyelination. Right inferior cerebellar encephalomalacia is stable.  The visualized paranasal sinuses and mastoid air cells are clear.  IMPRESSION: Stable.  No acute intracranial abnormality.  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  I discussed these findings by telephone with Dr. Thad Ranger at approximately 1140 hours on 06/17/2012.  Original Report Authenticated By: ERIC A. MANSELL, M.D.     ECHO:     CAROTID DUPLEX: Bilateral minor plaques at origins of internal carotids but no significant extracranial carotid artery stenosis demonstrated. Vertebrals are patent with antegrade flow.  - Other specific details can be found in the table(s) above. - No significant ICA stenosis noted bilaterally.   Recent Results (from the past 240 hour(s))  CULTURE, BLOOD (ROUTINE X 2)     Status: Normal (Preliminary result)   Collection Time   06/18/12  3:38 PM      Component Value Range Status Comment   Specimen Description BLOOD RIGHT ARM   Final    Special Requests BOTTLES DRAWN AEROBIC AND ANAEROBIC 10CC   Final    Culture  Setup Time 06/18/2012 23:52   Final    Culture     Final    Value:        BLOOD CULTURE RECEIVED NO GROWTH TO DATE CULTURE WILL BE HELD FOR 5 DAYS BEFORE ISSUING A FINAL NEGATIVE REPORT   Report Status PENDING    Incomplete   CULTURE, BLOOD (ROUTINE X 2)     Status: Normal (Preliminary result)   Collection Time   06/18/12  3:44 PM      Component Value Range Status Comment   Specimen Description BLOOD LEFT ARM   Final  Special Requests BOTTLES DRAWN AEROBIC AND ANAEROBIC 10CC   Final    Culture  Setup Time 06/18/2012 23:52   Final    Culture     Final    Value:        BLOOD CULTURE RECEIVED NO GROWTH TO DATE CULTURE WILL BE HELD FOR 5 DAYS BEFORE ISSUING A FINAL NEGATIVE REPORT   Report Status PENDING   Incomplete     BRIEF ADMITTING H & P: [name] is an 56 y.o. male with history of stroke, DM, HTN presenting with reports that yesterday before going to bed he felt weak. When he awakened in the middle of the night he felt nauseous and had some vomiting. Was stumbling as he attempted to go to the bathroom. When he awakened this morning felt numb and weak on the right side. Patient has deficits on the left secondary to an old infarct. EMS was called at that time and patient was brought in as a code stroke. She was seen in the ED by neurology and no tPa was given. He was referred for medical admission.     Hospital Course:  Present on Admission:  . Fever: The patient had been afebrile throughout his hospital stay however today the patient had a fever of 102.1. A repeat evaluation confirmed a temperature. I've ordered Urinialysis, blood cultures and CXR on the patient however the patient states that he will not stay to have the fever evaluated. I have informed [name] of his risk up to and including death that could be associated with an untreated infection. Please note the patient had been on Zyvox prior hospitalization which was discontinued at the time of admission. The Zyvox was prescribed by his out-pt provider for treatment of cellulitis. I personally spoke with his Provider Raenette Rover (Ph# [phone]) who confirms that the Zyvox was prescribed in the absence of blood cultures for  treatment of a cellulitis.  .Hemiplegia, unspecified, affecting dominant side: Patient presented with complaints of weakness on his dominant side. He was evaluated for TIA versus stroke. The patient had no evidence of CVA on radiologic studies. The patient has a pacemaker and this could not receive an MRI. The patient had serial CTs of the brain which showed no evidence of CVA.the patient was evaluated by physical therapy and occupational therapy both recommended home health services. The patient however insisted that he wanted to be placed in a skilled nursing facility since his discharge was held. Other the patient is now distended his request for placement to skilled nursing facility despite the efforts of the social worker. He is often to go home. We will send home health nursing and physical therapy however the patient has indicated that he will likely refuse the services.   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 He was seen by cardiology who recommended no antibiotics at this time. They do plan to remove the pacemaker likely next week. In the interim the patient will wear a vest until the area has healed and the infection resolved and then a pacemaker reimplanted at a later date.  This is often discussed by Dr. Rennis Golden with the patient on yesterday.  .Chronic kidney disease, stage 3-4, mod decreased GFR: The patient has chronic kidney disease. His baseline creatinine from 03/14/2012 was 2.45. At the time of discharge patient's creatinine is 2.36. His Lasix and Aldactone were held during this hospitalization I will  defer to his cardiologist to determine whether or not he should resume.   Marland KitchenPAF (paroxysmal atrial fibrillation), appears to be in NSR: Heart rate well controlled patient continue on his amiodarone  .CHF (congestive heart failure): The patient has chronic systolic and diastolic  heart failure. Currently he is well clinically compensated. The patient had Aldactone which has been stopped at this time due to his worsening renal function.   .Hypertension: Blood pressure well controlled.   .Hypoglycemia associated with diabetes: Patient had episodes of hypoglycemia while hospitalized.thus glipizide was discontinued and the patient's Lantus was decreased to 45 units 20 units subcutaneous at bedtime. This dosage has been maintaining his blood sugars.   Patient is not medically stable for discharge however insists on leaving AGAINST MEDICAL ADVICE  Disposition and Follow-up: Patient to followup with Dr. Rennis Golden in the outpatient cardiology office as arranged by them. He will followup with his primary care physician Dr. Karna Christmas in one week. Discharge Orders    Future Appointments: Provider: Department: Dept Phone: Center:   07/10/2012 12:00 PM Marinus Maw, MD [facility] [phone] [facility]     Future Orders Please Complete By Expires   Diet - low sodium heart healthy      Increase activity slowly      Walker          DISCHARGE EXAM:  General: Alert, awake, oriented x3, in no acute distress. However, patient is covered in a blanket while sitting in the chair complaining of chills appear Vital Signs:Blood pressure 125/64, pulse 96, temperature 102.4 F (39.1 C), temperature source Oral, resp. rate 16, height 5' 9.5" (1.765 m), weight 96.163 kg (212 lb), SpO2 98.00%. Heart: Regular rate and rhythm, without murmurs, rubs, gallops.  Lungs: Clear to auscultation but with diminished breath sounds at the bases. There is no warmth, erythema or tenderness over the site of the pacemaker. Abdomen: Soft, nontender, nondistended, positive bowel sounds, no masses no hepatosplenomegaly noted..  Neuro: No focal neurological deficits noted cranial nerves II through XII grossly intact Skin: The patient's skin is warm and dry throughout there is no evidence of infection with  inspection of the skin.  Patient is not medically stable for discharge however insists on leaving AGAINST MEDICAL ADVICE   [facility] 06/17/12 1156 06/17/12 1138  NA 134* 131*  K 4.2 4.1  CL 97 93*  CO2 -- 28  GLUCOSE 213* 219*  BUN 35* 33*  CREATININE 2.20* 2.36*  CALCIUM -- 9.3  MG -- --  PHOS -- --    Basename 06/17/12 1138  AST 43*  ALT 33  ALKPHOS 70  BILITOT 0.7  PROT 7.4  ALBUMIN 3.9   No results found for this basename: LIPASE:2,AMYLASE:2 in the last 72 hours  Basename 06/17/12 1156 06/17/12 1138  WBC -- 3.3*  NEUTROABS -- 2.7  HGB 11.9* 11.5*  HCT 35.0* 33.2*  MCV -- 86.7  PLT -- 113*    Signed: Laquinta Hazell A. 06/20/2012, 10:29 AM

## 2012-06-20 NOTE — Telephone Encounter (Signed)
Fu call °Pt returning your call  °

## 2012-06-21 NOTE — Telephone Encounter (Signed)
Spoke with patient.  Instructions reviewed.  He will hold glycemic medications day of procedure.  He was instructed by Dr Ladona Ridgel to hold [facility] for 4 days before procedure.

## 2012-06-21 NOTE — Telephone Encounter (Signed)
Pt rtn call to amber

## 2012-06-21 NOTE — Telephone Encounter (Signed)
Spoke with [place] with [facility].  Extraction is confirmed in the OR for Wednesday, July 31st at [facility].  Patient to arrive at short stay at 1PM.  Nothing to eat or drink after midnight night before procedure.    Left message for patient to call to receive instructions.

## 2012-06-24 ENCOUNTER — Encounter (HOSPITAL_COMMUNITY): Payer: Self-pay | Admitting: Respiratory Therapy

## 2012-06-24 LAB — CULTURE, BLOOD (ROUTINE X 2): Culture: NO GROWTH

## 2012-06-25 ENCOUNTER — Encounter (HOSPITAL_COMMUNITY)
Admission: RE | Admit: 2012-06-25 | Discharge: 2012-06-25 | Disposition: A | Discharge status: Home / Self Care | Payer: Medicare Other | Source: Ambulatory Visit | Attending: Internal Medicine | Admitting: Internal Medicine

## 2012-06-25 ENCOUNTER — Encounter (HOSPITAL_COMMUNITY): Payer: Self-pay

## 2012-06-25 ENCOUNTER — Other Ambulatory Visit: Payer: Self-pay | Admitting: *Deleted

## 2012-06-25 DIAGNOSIS — I428 Other cardiomyopathies: Secondary | ICD-10-CM

## 2012-06-25 HISTORY — DX: Other specified postprocedural states: R11.2

## 2012-06-25 HISTORY — DX: Gout, unspecified: M10.9

## 2012-06-25 HISTORY — DX: Polyneuropathy, unspecified: G62.9

## 2012-06-25 HISTORY — DX: Shortness of breath: R06.02

## 2012-06-25 HISTORY — DX: Noninfective gastroenteritis and colitis, unspecified: K52.9

## 2012-06-25 HISTORY — DX: Inflammatory liver disease, unspecified: K75.9

## 2012-06-25 HISTORY — DX: Personal history of urinary (tract) infections: Z87.440

## 2012-06-25 HISTORY — DX: Acute myocardial infarction, unspecified: I21.9

## 2012-06-25 HISTORY — DX: Other injury of unspecified body region, initial encounter: T14.8XXA

## 2012-06-25 HISTORY — DX: Frequency of micturition: R35.0

## 2012-06-25 HISTORY — DX: Nausea with vomiting, unspecified: Z98.890

## 2012-06-25 HISTORY — DX: Depression, unspecified: F32.A

## 2012-06-25 HISTORY — DX: Personal history of other diseases of the digestive system: Z87.19

## 2012-06-25 HISTORY — DX: Insomnia, unspecified: G47.00

## 2012-06-25 HISTORY — DX: Unspecified hemorrhoids: K64.9

## 2012-06-25 HISTORY — DX: Benign prostatic hyperplasia without lower urinary tract symptoms: N40.0

## 2012-06-25 HISTORY — DX: Unspecified osteoarthritis, unspecified site: M19.90

## 2012-06-25 HISTORY — DX: Urgency of urination: R39.15

## 2012-06-25 HISTORY — DX: Other constipation: K59.09

## 2012-06-25 HISTORY — DX: Atherosclerotic heart disease of native coronary artery without angina pectoris: I25.10

## 2012-06-25 HISTORY — DX: Major depressive disorder, single episode, unspecified: F32.9

## 2012-06-25 LAB — COMPREHENSIVE METABOLIC PANEL
AST: 33 U/L (ref 0–37)
Albumin: 3.2 g/dL — ABNORMAL LOW (ref 3.5–5.2)
BUN: 39 mg/dL — ABNORMAL HIGH (ref 6–23)
Calcium: 9.1 mg/dL (ref 8.4–10.5)
Chloride: 101 mEq/L (ref 96–112)
Creatinine, Ser: 2.09 mg/dL — ABNORMAL HIGH (ref 0.50–1.35)
Total Protein: 7 g/dL (ref 6.0–8.3)

## 2012-06-25 LAB — SURGICAL PCR SCREEN
MRSA, PCR: NEGATIVE
Staphylococcus aureus: NEGATIVE

## 2012-06-25 NOTE — Progress Notes (Signed)
Spoke with Dr.Kasik and notified of abn labs-do an ISTAT on arrival

## 2012-06-25 NOTE — Progress Notes (Signed)
Dr.C is cardiologist-to request last office visit   Stress test to be requested from [facility] in epic from 2011 and 2012;to request the one from this yr  Dr.Emory Ardelle Park with [facility] in [place]  EKG in epic from 06/17/12

## 2012-06-25 NOTE — Progress Notes (Signed)
Average fasting sugar from 90-134

## 2012-06-25 NOTE — Progress Notes (Signed)
Notified rep with [employer] of pt surgery tomorrow scheduled for 1700

## 2012-06-25 NOTE — Pre-Procedure Instructions (Signed)
20 [name]  06/25/2012   Your procedure is scheduled on:  Wed, July 31 @ 5:00 PM  Report to [facility] at 3:00 AM.  Call this number if you have problems the morning of surgery: [phone]   Remember:   Do not eat food:After Midnight.    Take these medicines the morning of surgery with A SIP OF WATER: Tamsulosin(Flomax),Omeprazole(Prilosec)Isosorbide(Imdur),Pain Pill(if needed),Hydralazine(Apresoloine),Gabapentin(Neurontin),Colchicine,Carvedilol(Coreg),and Amiodarone(Pacerone)   Do not wear jewelry  Do not wear lotions, powders, or colognes             Men may shave face and neck.  Do not bring valuables to the hospital.  Contacts, dentures or bridgework may not be worn into surgery.  Leave suitcase in the car. After surgery it may be brought to your room.  For patients admitted to the hospital, checkout time is 11:00 AM the day of discharge.   Patients discharged the day of surgery will not be allowed to drive home.  Special Instructions: CHG Shower Use Special Wash: 1/2 bottle night before surgery and 1/2 bottle morning of surgery.   Please read over the following fact sheets that you were given: Pain Booklet, Coughing and Deep Breathing, MRSA Information and Surgical Site Infection Prevention

## 2012-06-26 ENCOUNTER — Ambulatory Visit (HOSPITAL_COMMUNITY): Payer: Medicare Other | Admitting: Anesthesiology

## 2012-06-26 ENCOUNTER — Encounter (HOSPITAL_COMMUNITY): Payer: Self-pay | Admitting: Vascular Surgery

## 2012-06-26 ENCOUNTER — Encounter (HOSPITAL_COMMUNITY)
Admission: RE | Disposition: A | Discharge status: Skilled Nursing Facility | Payer: Self-pay | Source: Ambulatory Visit | Attending: Internal Medicine

## 2012-06-26 ENCOUNTER — Encounter (HOSPITAL_COMMUNITY): Payer: Self-pay | Admitting: *Deleted

## 2012-06-26 ENCOUNTER — Encounter (HOSPITAL_COMMUNITY): Payer: Self-pay | Admitting: Anesthesiology

## 2012-06-26 ENCOUNTER — Inpatient Hospital Stay (HOSPITAL_COMMUNITY)
Admission: RE | Admit: 2012-06-26 | Discharge: 2012-06-28 | DRG: 261 | Disposition: A | Discharge status: Skilled Nursing Facility | Payer: Medicare Other | Source: Ambulatory Visit | Attending: Internal Medicine | Admitting: Internal Medicine

## 2012-06-26 DIAGNOSIS — T827XXA Infection and inflammatory reaction due to other cardiac and vascular devices, implants and grafts, initial encounter: Secondary | ICD-10-CM

## 2012-06-26 DIAGNOSIS — J45909 Unspecified asthma, uncomplicated: Secondary | ICD-10-CM | POA: Diagnosis present

## 2012-06-26 DIAGNOSIS — B999 Unspecified infectious disease: Secondary | ICD-10-CM

## 2012-06-26 DIAGNOSIS — K219 Gastro-esophageal reflux disease without esophagitis: Secondary | ICD-10-CM | POA: Diagnosis present

## 2012-06-26 DIAGNOSIS — I5022 Chronic systolic (congestive) heart failure: Secondary | ICD-10-CM | POA: Diagnosis present

## 2012-06-26 DIAGNOSIS — Y831 Surgical operation with implant of artificial internal device as the cause of abnormal reaction of the patient, or of later complication, without mention of misadventure at the time of the procedure: Secondary | ICD-10-CM | POA: Diagnosis present

## 2012-06-26 DIAGNOSIS — I255 Ischemic cardiomyopathy: Secondary | ICD-10-CM

## 2012-06-26 DIAGNOSIS — I509 Heart failure, unspecified: Secondary | ICD-10-CM | POA: Diagnosis present

## 2012-06-26 DIAGNOSIS — E785 Hyperlipidemia, unspecified: Secondary | ICD-10-CM | POA: Diagnosis present

## 2012-06-26 DIAGNOSIS — I2510 Atherosclerotic heart disease of native coronary artery without angina pectoris: Secondary | ICD-10-CM | POA: Diagnosis present

## 2012-06-26 DIAGNOSIS — Z9861 Coronary angioplasty status: Secondary | ICD-10-CM

## 2012-06-26 DIAGNOSIS — I480 Paroxysmal atrial fibrillation: Secondary | ICD-10-CM

## 2012-06-26 DIAGNOSIS — I2589 Other forms of chronic ischemic heart disease: Secondary | ICD-10-CM | POA: Diagnosis present

## 2012-06-26 DIAGNOSIS — S98139A Complete traumatic amputation of one unspecified lesser toe, initial encounter: Secondary | ICD-10-CM

## 2012-06-26 DIAGNOSIS — I428 Other cardiomyopathies: Secondary | ICD-10-CM

## 2012-06-26 DIAGNOSIS — R531 Weakness: Secondary | ICD-10-CM

## 2012-06-26 DIAGNOSIS — Z01818 Encounter for other preprocedural examination: Secondary | ICD-10-CM

## 2012-06-26 DIAGNOSIS — I4891 Unspecified atrial fibrillation: Secondary | ICD-10-CM | POA: Diagnosis present

## 2012-06-26 DIAGNOSIS — B1920 Unspecified viral hepatitis C without hepatic coma: Secondary | ICD-10-CM | POA: Diagnosis present

## 2012-06-26 DIAGNOSIS — I252 Old myocardial infarction: Secondary | ICD-10-CM

## 2012-06-26 DIAGNOSIS — I129 Hypertensive chronic kidney disease with stage 1 through stage 4 chronic kidney disease, or unspecified chronic kidney disease: Secondary | ICD-10-CM | POA: Diagnosis present

## 2012-06-26 DIAGNOSIS — Z8673 Personal history of transient ischemic attack (TIA), and cerebral infarction without residual deficits: Secondary | ICD-10-CM

## 2012-06-26 DIAGNOSIS — E1142 Type 2 diabetes mellitus with diabetic polyneuropathy: Secondary | ICD-10-CM | POA: Diagnosis present

## 2012-06-26 DIAGNOSIS — N183 Chronic kidney disease, stage 3 unspecified: Secondary | ICD-10-CM | POA: Diagnosis present

## 2012-06-26 DIAGNOSIS — E1149 Type 2 diabetes mellitus with other diabetic neurological complication: Secondary | ICD-10-CM | POA: Diagnosis present

## 2012-06-26 DIAGNOSIS — D649 Anemia, unspecified: Secondary | ICD-10-CM | POA: Diagnosis present

## 2012-06-26 DIAGNOSIS — I10 Essential (primary) hypertension: Secondary | ICD-10-CM

## 2012-06-26 HISTORY — PX: PACEMAKER LEAD REMOVAL: SHX5064

## 2012-06-26 LAB — POCT I-STAT 4, (NA,K, GLUC, HGB,HCT)
Glucose, Bld: 183 mg/dL — ABNORMAL HIGH (ref 70–99)
HCT: 30 % — ABNORMAL LOW (ref 39.0–52.0)
HCT: 31 % — ABNORMAL LOW (ref 39.0–52.0)
Hemoglobin: 10.2 g/dL — ABNORMAL LOW (ref 13.0–17.0)
Potassium: 4.3 mEq/L (ref 3.5–5.1)
Sodium: 143 mEq/L (ref 135–145)
Sodium: 143 mEq/L (ref 135–145)

## 2012-06-26 LAB — TYPE AND SCREEN: ABO/RH(D): O NEG

## 2012-06-26 LAB — GLUCOSE, CAPILLARY

## 2012-06-26 SURGERY — REMOVAL, ELECTRODE LEAD, CARDIAC PACEMAKER, WITHOUT REPLACEMENT
Anesthesia: General | Site: Chest | Wound class: Dirty or Infected

## 2012-06-26 MED ORDER — TAMSULOSIN HCL 0.4 MG PO CAPS
0.4000 mg | ORAL_CAPSULE | Freq: Every day | ORAL | Status: DC
Start: 2012-06-27 — End: 2012-06-28
  Administered 2012-06-27 – 2012-06-28 (×2): 0.4 mg via ORAL
  Filled 2012-06-26 (×2): qty 1

## 2012-06-26 MED ORDER — AMIODARONE HCL 200 MG PO TABS
200.0000 mg | ORAL_TABLET | Freq: Two times a day (BID) | ORAL | Status: DC
Start: 2012-06-26 — End: 2012-06-28
  Administered 2012-06-26 – 2012-06-28 (×4): 200 mg via ORAL
  Filled 2012-06-26 (×5): qty 1

## 2012-06-26 MED ORDER — VANCOMYCIN HCL IN DEXTROSE 1-5 GM/200ML-% IV SOLN
1000.0000 mg | INTRAVENOUS | Status: DC
  Filled 2012-06-26: qty 200

## 2012-06-26 MED ORDER — DEXTROSE 5 % IV SOLN
INTRAVENOUS | Status: DC | PRN
  Administered 2012-06-26: 16:00:00 via INTRAVENOUS

## 2012-06-26 MED ORDER — INSULIN ASPART 100 UNIT/ML ~~LOC~~ SOLN: 1.0000 [IU] | Freq: Three times a day (TID) | SUBCUTANEOUS | Status: DC

## 2012-06-26 MED ORDER — LIDOCAINE HCL (PF) 1 % IJ SOLN
INTRAMUSCULAR | Status: AC
Start: 2012-06-26 — End: 2012-06-26
  Filled 2012-06-26: qty 30

## 2012-06-26 MED ORDER — TICAGRELOR 90 MG PO TABS
90.0000 mg | ORAL_TABLET | Freq: Two times a day (BID) | ORAL | Status: DC
  Administered 2012-06-26 – 2012-06-28 (×4): 90 mg via ORAL
  Filled 2012-06-26 (×5): qty 1

## 2012-06-26 MED ORDER — HEPARIN (PORCINE) IN NACL 2-0.9 UNIT/ML-% IJ SOLN
INTRAMUSCULAR | Status: DC | PRN
  Administered 2012-06-26: 1000 mL via INTRAVENOUS

## 2012-06-26 MED ORDER — HYDROCODONE-ACETAMINOPHEN 5-325 MG PO TABS
1.0000 | ORAL_TABLET | Freq: Every day | ORAL | Status: DC
  Filled 2012-06-26: qty 1

## 2012-06-26 MED ORDER — COLCHICINE 0.6 MG PO TABS
0.6000 mg | ORAL_TABLET | Freq: Every day | ORAL | Status: DC
Start: 2012-06-27 — End: 2012-06-28
  Administered 2012-06-27 – 2012-06-28 (×2): 0.6 mg via ORAL
  Filled 2012-06-26 (×2): qty 1

## 2012-06-26 MED ORDER — PANTOPRAZOLE SODIUM 40 MG PO TBEC
40.0000 mg | DELAYED_RELEASE_TABLET | Freq: Every day | ORAL | Status: DC
  Administered 2012-06-27 – 2012-06-28 (×2): 40 mg via ORAL
  Filled 2012-06-26 (×2): qty 1

## 2012-06-26 MED ORDER — PROPOFOL 10 MG/ML IV EMUL
INTRAVENOUS | Status: DC | PRN
  Administered 2012-06-26: 100 mg via INTRAVENOUS

## 2012-06-26 MED ORDER — FENTANYL CITRATE 0.05 MG/ML IJ SOLN
INTRAMUSCULAR | Status: DC | PRN
  Administered 2012-06-26 (×4): 50 ug via INTRAVENOUS

## 2012-06-26 MED ORDER — DONEPEZIL HCL 5 MG PO TABS
5.0000 mg | ORAL_TABLET | Freq: Every day | ORAL | Status: DC
  Administered 2012-06-26 – 2012-06-27 (×2): 5 mg via ORAL
  Filled 2012-06-26 (×3): qty 1

## 2012-06-26 MED ORDER — HYDRALAZINE HCL 25 MG PO TABS
25.0000 mg | ORAL_TABLET | Freq: Three times a day (TID) | ORAL | Status: DC
  Administered 2012-06-27 – 2012-06-28 (×6): 25 mg via ORAL
  Filled 2012-06-26 (×7): qty 1

## 2012-06-26 MED ORDER — ACETAMINOPHEN 325 MG PO TABS: 325.0000 mg | ORAL_TABLET | ORAL | Status: DC | PRN

## 2012-06-26 MED ORDER — FENTANYL CITRATE 0.05 MG/ML IJ SOLN
25.0000 ug | INTRAMUSCULAR | Status: DC | PRN
  Administered 2012-06-26 (×2): 25 ug via INTRAVENOUS

## 2012-06-26 MED ORDER — ROCURONIUM BROMIDE 100 MG/10ML IV SOLN
INTRAVENOUS | Status: DC | PRN
  Administered 2012-06-26: 10 mg via INTRAVENOUS
  Administered 2012-06-26: 50 mg via INTRAVENOUS

## 2012-06-26 MED ORDER — LACTATED RINGERS IV SOLN: INTRAVENOUS | Status: DC

## 2012-06-26 MED ORDER — GLYCOPYRROLATE 0.2 MG/ML IJ SOLN
INTRAMUSCULAR | Status: DC | PRN
  Administered 2012-06-26: .6 mg via INTRAVENOUS

## 2012-06-26 MED ORDER — CHLORHEXIDINE GLUCONATE 4 % EX LIQD: 60.0000 mL | Freq: Once | CUTANEOUS | Status: DC

## 2012-06-26 MED ORDER — MIDAZOLAM HCL 5 MG/5ML IJ SOLN
INTRAMUSCULAR | Status: DC | PRN
  Administered 2012-06-26: 1 mg via INTRAVENOUS

## 2012-06-26 MED ORDER — ISOSORBIDE MONONITRATE ER 60 MG PO TB24
90.0000 mg | ORAL_TABLET | Freq: Every day | ORAL | Status: DC
  Administered 2012-06-27 (×2): 90 mg via ORAL
  Filled 2012-06-26 (×3): qty 1

## 2012-06-26 MED ORDER — SODIUM CHLORIDE 0.9 % IR SOLN
80.0000 mg | Status: DC
  Filled 2012-06-26: qty 2

## 2012-06-26 MED ORDER — HYDROMORPHONE HCL PF 1 MG/ML IJ SOLN
INTRAMUSCULAR | Status: AC
  Filled 2012-06-26: qty 1

## 2012-06-26 MED ORDER — HYDROCODONE-ACETAMINOPHEN 5-325 MG PO TABS
1.0000 | ORAL_TABLET | ORAL | Status: DC | PRN
  Administered 2012-06-27: 1 via ORAL
  Administered 2012-06-27 – 2012-06-28 (×4): 2 via ORAL
  Filled 2012-06-26 (×2): qty 2
  Filled 2012-06-26: qty 1
  Filled 2012-06-26 (×2): qty 2

## 2012-06-26 MED ORDER — INSULIN GLARGINE 100 UNIT/ML ~~LOC~~ SOLN
20.0000 [IU] | Freq: Every day | SUBCUTANEOUS | Status: DC
  Administered 2012-06-27: 20 [IU] via SUBCUTANEOUS

## 2012-06-26 MED ORDER — INSULIN ASPART 100 UNIT/ML ~~LOC~~ SOLN
0.0000 [IU] | Freq: Three times a day (TID) | SUBCUTANEOUS | Status: DC
  Administered 2012-06-27: 3 [IU] via SUBCUTANEOUS
  Administered 2012-06-27: 5 [IU] via SUBCUTANEOUS
  Administered 2012-06-27: 8 [IU] via SUBCUTANEOUS
  Administered 2012-06-28: 11 [IU] via SUBCUTANEOUS
  Administered 2012-06-28: 5 [IU] via SUBCUTANEOUS
  Administered 2012-06-28: 8 [IU] via SUBCUTANEOUS

## 2012-06-26 MED ORDER — NEOSTIGMINE METHYLSULFATE 1 MG/ML IJ SOLN
INTRAMUSCULAR | Status: DC | PRN
  Administered 2012-06-26: 5 mg via INTRAVENOUS

## 2012-06-26 MED ORDER — CARVEDILOL 6.25 MG PO TABS
9.3750 mg | ORAL_TABLET | Freq: Two times a day (BID) | ORAL | Status: DC
  Administered 2012-06-27 – 2012-06-28 (×4): 9.375 mg via ORAL
  Filled 2012-06-26 (×5): qty 1

## 2012-06-26 MED ORDER — LIDOCAINE HCL (CARDIAC) 20 MG/ML IV SOLN
INTRAVENOUS | Status: DC | PRN
  Administered 2012-06-26: 100 mg via INTRAVENOUS

## 2012-06-26 MED ORDER — MEPERIDINE HCL 25 MG/ML IJ SOLN: 6.2500 mg | INTRAMUSCULAR | Status: DC | PRN

## 2012-06-26 MED ORDER — LACTATED RINGERS IV SOLN
INTRAVENOUS | Status: DC | PRN
  Administered 2012-06-26: 15:00:00 via INTRAVENOUS

## 2012-06-26 MED ORDER — SODIUM CHLORIDE 0.9 % IR SOLN
Status: DC | PRN
  Administered 2012-06-26: 16:00:00

## 2012-06-26 MED ORDER — GABAPENTIN 300 MG PO CAPS
300.0000 mg | ORAL_CAPSULE | Freq: Three times a day (TID) | ORAL | Status: DC
  Administered 2012-06-26 – 2012-06-28 (×6): 300 mg via ORAL
  Filled 2012-06-26 (×7): qty 1

## 2012-06-26 MED ORDER — VANCOMYCIN HCL 1000 MG IV SOLR
1250.0000 mg | Freq: Two times a day (BID) | INTRAVENOUS | Status: AC
  Administered 2012-06-27 – 2012-06-28 (×4): 1250 mg via INTRAVENOUS
  Filled 2012-06-26 (×4): qty 1250

## 2012-06-26 MED ORDER — HYDROMORPHONE HCL PF 1 MG/ML IJ SOLN
0.2500 mg | INTRAMUSCULAR | Status: DC | PRN
  Administered 2012-06-26: 0.5 mg via INTRAVENOUS

## 2012-06-26 MED ORDER — ONDANSETRON HCL 4 MG/2ML IJ SOLN: 4.0000 mg | Freq: Four times a day (QID) | INTRAMUSCULAR | Status: DC | PRN

## 2012-06-26 MED ORDER — HYDROCODONE-ACETAMINOPHEN 5-500 MG PO TABS: 1.0000 | ORAL_TABLET | Freq: Every day | ORAL | Status: DC

## 2012-06-26 MED ORDER — FENTANYL CITRATE 0.05 MG/ML IJ SOLN
INTRAMUSCULAR | Status: AC
  Filled 2012-06-26: qty 2

## 2012-06-26 MED ORDER — PROMETHAZINE HCL 25 MG/ML IJ SOLN: 6.2500 mg | INTRAMUSCULAR | Status: DC | PRN

## 2012-06-26 MED ORDER — LIDOCAINE HCL (PF) 1 % IJ SOLN
INTRAMUSCULAR | Status: DC | PRN
  Administered 2012-06-26: 30 mL

## 2012-06-26 MED ORDER — FEBUXOSTAT 40 MG PO TABS
40.0000 mg | ORAL_TABLET | Freq: Every day | ORAL | Status: DC
  Administered 2012-06-26 – 2012-06-28 (×3): 40 mg via ORAL
  Filled 2012-06-26 (×3): qty 1

## 2012-06-26 MED ORDER — VANCOMYCIN HCL IN DEXTROSE 1-5 GM/200ML-% IV SOLN
INTRAVENOUS | Status: AC
  Administered 2012-06-26: 1000 mg via INTRAVENOUS
  Filled 2012-06-26: qty 200

## 2012-06-26 SURGICAL SUPPLY — 33 items
BAG DECANTER FOR FLEXI CONT (MISCELLANEOUS) ×2 IMPLANT
CANISTER SUCTION 2500CC (MISCELLANEOUS) ×2 IMPLANT
CLOTH BEACON ORANGE TIMEOUT ST (SAFETY) ×2 IMPLANT
DRAPE C-ARM 42X72 X-RAY (DRAPES) IMPLANT
DRAPE CARDIOVASCULAR INCISE (DRAPES) ×1
DRAPE INCISE IOBAN 66X45 STRL (DRAPES) ×2 IMPLANT
DRAPE PROXIMA HALF (DRAPES) ×4 IMPLANT
DRAPE SRG 135X102X78XABS (DRAPES) ×1 IMPLANT
DRSG OPSITE 6X11 MED (GAUZE/BANDAGES/DRESSINGS) IMPLANT
ELECT REM PT RETURN 9FT ADLT (ELECTROSURGICAL) ×2
ELECTRODE REM PT RTRN 9FT ADLT (ELECTROSURGICAL) ×1 IMPLANT
GAUZE SPONGE 4X4 16PLY XRAY LF (GAUZE/BANDAGES/DRESSINGS) ×2 IMPLANT
GLOVE BIO SURGEON STRL SZ 6 (GLOVE) ×2 IMPLANT
GLOVE BIO SURGEON STRL SZ8 (GLOVE) ×2 IMPLANT
GLOVE BIOGEL PI IND STRL 7.5 (GLOVE) ×1 IMPLANT
GLOVE BIOGEL PI INDICATOR 7.5 (GLOVE) ×1
GOWN PREVENTION PLUS XLARGE (GOWN DISPOSABLE) ×2 IMPLANT
GOWN STRL NON-REIN LRG LVL3 (GOWN DISPOSABLE) ×4 IMPLANT
KIT ROOM TURNOVER OR (KITS) ×2 IMPLANT
PACK CHEST (CUSTOM PROCEDURE TRAY) ×2 IMPLANT
PAD ARMBOARD 7.5X6 YLW CONV (MISCELLANEOUS) ×4 IMPLANT
PENCIL BUTTON HOLSTER BLD 10FT (ELECTRODE) IMPLANT
SPONGE GAUZE 4X4 12PLY (GAUZE/BANDAGES/DRESSINGS) ×2 IMPLANT
SUT PROLENE 2 0 CT2 30 (SUTURE) ×4 IMPLANT
SUT PROLENE 2 0 SH DA (SUTURE) ×2 IMPLANT
SUT SILK 0 FSL (SUTURE) ×4 IMPLANT
SUT SILK 1 TIES 10X30 (SUTURE) ×2 IMPLANT
SUT VIC AB 2-0 CT2 18 VCP726D (SUTURE) ×2 IMPLANT
SUT VIC AB 3-0 X1 27 (SUTURE) ×2 IMPLANT
TAPE CLOTH SURG 4X10 WHT LF (GAUZE/BANDAGES/DRESSINGS) ×2 IMPLANT
TOWEL OR 17X24 6PK STRL BLUE (TOWEL DISPOSABLE) ×6 IMPLANT
TUBE CONNECTING 12X1/4 (SUCTIONS) ×4 IMPLANT
YANKAUER SUCT BULB TIP NO VENT (SUCTIONS) ×6 IMPLANT

## 2012-06-26 NOTE — H&P (View-Only) (Signed)
ELECTROPHYSIOLOGY CONSULT NOTE  Patient ID: [name] MRN: [id number], DOB/AGE: 57-Feb-1957   Admit date: 06/17/2012 Date of Consult: 06/20/2012  Primary Physician: Galvin Proffer, MD Primary Cardiologist: Johney Frame, MD Reason for Consultation: Possible BiV ICD pocket infection  History of Present Illness [name] is a 56 year old gentleman with an ischemic CM, prior VF arrest s/p ICD implantation in 2006 with upgrade to BiV ICD with CRT in January 2011, severe CAD, insulin-dependent diabetes, hyperlipidemia, hypertension, chronic kidney disease-stage III, atrial fibrillation, stroke and asthma who has been admitted with right sided weakness and is undergoing neurologic work-up to r/o CVA. While here, he reports pain and swelling at his ICD implant site. This has been evaluated as well by his primary cardiologist who has requested we provide further EP recommendations.   His ICD was originally implanted in April 2006. He has been hospitalized in the past with multiple ICD shocks. Most recently, his ejection fraction is improved to 35-40%. In January 2011, he underwent BiV ICD generator change and RV lead revision by Dr. Sharol Harness at [facility]. According to his records, he developed a hematoma post-operatively which resolved over time without intervention. Mr. Cousineau states his implant site "has never been right" since this procedure. But more recently he has noticed increasing pain and swelling at the site x 2 months. He also reports "a boil" over the lateral edge about 2 months ago which he drained at home by himself. He was given oral antibiotics by his PCP and scheduled for follow-up at [facility]. He was seen by Dr. Alanda Amass on 05/23/2012.   He denies chest pain, shortness of breath, fever, abdominal pain, palpitations, dizziness, near syncope or syncope. He denies lower extremity swelling, cough, orthopnea or PND.   Past Medical History  Diagnosis Date  . Diabetes  mellitus with peripheral neuropathy   . Hypertension   . GERD (gastroesophageal reflux disease)   . Dyslipidemia   . Stroke 1996   . CHF (congestive heart failure)   . Atrial fibrillation   . Asthma   . Pneumonia   . Chronic kidney disease, stage III   . Hepatitis C   . Prior toe amputation secondary to osteomyelitis   . Ischemic cardiomyopathy  s/p ICD implantation 2006. s/p BiV ICD upgrade January 2011. At that time, required balloon venoplasty due to subclavian vein stenosis.   . Multivessel CAD s/p anterior MI in 1998. s/p LAD and PDA intervention in 2009. In-stent restenosis with a staged intervention to sites in  November 2011. Presented in March 2012 with VT and was found to have an occluded LAD, the PDA was patent s/p intervention with cutting balloon and angioplasty at multiple sites in the LAD     Allergies/Intolerances Allergies  Allergen Reactions  . Cephalosporins Nausea Only  . Morphine And Related Other (See Comments)    hypotension  . Penicillins Other (See Comments)    Reaction as a child -unknown    Inpatient Medications . amiodarone  200 mg Oral BID  . antiseptic oral rinse  15 mL Mouth Rinse q12n4p  . carvedilol  9.375 mg Oral BID WC  . colchicine  0.6 mg Oral Daily  . donepezil  5 mg Oral QHS  . enoxaparin (LOVENOX) injection  40 mg Subcutaneous Q24H  . febuxostat  40 mg Oral Daily  . fenofibrate  160 mg Oral Daily  . gabapentin  300 mg Oral TID  . hydrALAZINE  25 mg Oral TID  . HYDROcodone-acetaminophen  1  tablet Oral QHS  . insulin aspart  0-5 Units Subcutaneous QHS  . insulin aspart  0-9 Units Subcutaneous TID WC  . insulin aspart  4 Units Subcutaneous TID WC  . insulin glargine  20 Units Subcutaneous QHS  . isosorbide mononitrate  90 mg Oral QHS  . magnesium oxide  400 mg Oral BID  . pantoprazole  40 mg Oral Q1200  . Tamsulosin HCl  0.4 mg Oral Daily  . Ticagrelor  90 mg Oral BID  . vitamin B-12  250 mcg Oral Daily   Family History Positive  for premature CAD   Social History Social History  . Marital Status: Divorced   Social History Main Topics  . Smoking status: Never Smoker   . Smokeless tobacco: Not on file  . Alcohol Use: No  . Drug Use: No   Review of Systems General:  No chills, fever, night sweats or weight changes  Cardiovascular:  No chest pain, dyspnea on exertion, edema, orthopnea, palpitations, paroxysmal nocturnal dyspnea Dermatological: No rash, lesions or masses Respiratory: No cough, dyspnea Urologic: No hematuria, dysuria Abdominal: No nausea, vomiting, diarrhea, bright red blood per rectum, melena, or hematemesis Neurologic: Positive for weakness. No visual changes or changes in mental status All other systems reviewed and are otherwise negative except as noted above.  Physical Exam Blood pressure 125/64, pulse 96, temperature 102.2 F (39 C), temperature source Oral, resp. rate 16, height 5' 9.5" (1.765 m), weight 212 lb (96.163 kg), SpO2 98.00%.  General: Well developed, well appearing 56 year old male in no acute distress. HEENT: Normocephalic, atraumatic. EOMs intact. Sclera nonicteric. Oropharynx clear.  Neck: Supple. No JVD. Lungs: Respirations regular and unlabored, CTA bilaterally. No wheezes, rales or rhonchi. Heart: RRR. S1, S2 present. No murmurs, rub, S3 or S4. Abdomen: Soft, non-tender, non-distended. BS present x 4 quadrants. No hepatosplenomegaly.  Extremities: No clubbing, cyanosis or edema. DP/PT/Radials 2+ and equal bilaterally. Psych: Normal affect. Neuro: Alert and oriented X 3. Moves all extremities spontaneously. Skin: Left upper chest/ICD implant site intact with no active bleeding or hematoma. There is no warmth or drainage. The lateral edge is adhered to skin and non-mobile and thin. There is tenderness to palpation of site.   Labs  [facility] 06/17/12 1138  CKTOTAL 147  CKMB 4.3*  TROPONINI <0.30   Lab Results  Component Value Date   WBC 3.3* 06/17/2012   HGB 11.9*  06/17/2012   HCT 35.0* 06/17/2012   MCV 86.7 06/17/2012   PLT 113* 06/17/2012     Lab 06/17/12 1156 06/17/12 1138  NA 134* --  K 4.2 --  CL 97 --  CO2 -- 28  BUN 35* --  CREATININE 2.20* --  CALCIUM -- 9.3  PROT -- 7.4  BILITOT -- 0.7  ALKPHOS -- 70  ALT -- 33  AST -- 43*  GLUCOSE 213* --   No components found with this basename: MAGNESIUM No components found with this basename: POCBNP:3 No results found for this basename: DDIMER   No results found for this basename: TSH,T4TOTAL,FREET3,T3FREE,THYROIDAB in the last 72 hours   Basename 06/17/12 1138  INR 1.17    Radiology/Studies Ct Head Wo Contrast  06/17/2012  *RADIOLOGY REPORT*  Clinical Data: Code stroke  CT HEAD WITHOUT CONTRAST  Technique:  Contiguous axial images were obtained from the base of the skull through the vertex without contrast.  Comparison: 11/06/2010  Findings: There is no evidence for acute hemorrhage, hydrocephalus, mass lesion, or abnormal extra-axial fluid collection.  No definite  CT evidence for acute infarction.  Patchy low attenuation in the deep hemispheric and periventricular white matter is nonspecific, but likely reflects chronic microvascular ischemic demyelination. Right inferior cerebellar encephalomalacia is stable.  The visualized paranasal sinuses and mastoid air cells are clear.  IMPRESSION: Stable.  No acute intracranial abnormality.  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  I discussed these findings by telephone with Dr. Thad Ranger at approximately 1140 hours on 06/17/2012.  Original Report Authenticated By: ERIC A. MANSELL, M.D.   Echocardiogram  pending  Assessment and Plan 1. ICD pocket infection - he is afebrile and hemodynamically stable; agree with blood cultures and echocardiogram; will check a sedimentation rate 2. ICM 3. Chronic systolic CHF Rec: I have discussed the treatment options with the patient and have recommended ICD lead  extraction, placement of a life vest and re-insertion after 3-4 weeks.  Lewayne Bunting, MD 06/20/2012 11:21 AM

## 2012-06-26 NOTE — Clinical Social Work Note (Signed)
CSW notarized advance directives and [facility] POA paperwork for Pt per his request. Pt stated that he has filled them out in the past and understands that the one today will revoke previous paperwork.  POA on chart. Family at bedside.  No further CSW needs identified at this time.   Frederico Hamman, [place]  [phone] ED Clinical Social Worker

## 2012-06-26 NOTE — Anesthesia Postprocedure Evaluation (Signed)
  Anesthesia Post-op Note  Patient: [name]  Procedure(s) Performed: Procedure(s) (LRB): PACEMAKER LEAD REMOVAL (N/A)  Patient Location: PACU  Anesthesia Type: General  Level of Consciousness: awake  Airway and Oxygen Therapy: Patient Spontanous Breathing  Post-op Pain: mild  Post-op Assessment: Post-op Vital signs reviewed  Post-op Vital Signs: stable  Complications: No apparent anesthesia complications

## 2012-06-26 NOTE — Anesthesia Procedure Notes (Signed)
Procedure Name: Intubation Date/Time: 06/26/2012 3:57 PM Performed by: Marni Griffon Pre-anesthesia Checklist: Patient identified, Emergency Drugs available, Suction available and Patient being monitored Patient Re-evaluated:Patient Re-evaluated prior to inductionOxygen Delivery Method: Circle system utilized Preoxygenation: Pre-oxygenation with 100% oxygen Intubation Type: IV induction Ventilation: Mask ventilation without difficulty and Oral airway inserted - appropriate to patient size Laryngoscope Size: Mac and 4 Grade View: Grade I Tube type: Oral Tube size: 7.5 mm Number of attempts: 1 Airway Equipment and Method: Stylet Placement Confirmation: ETT inserted through vocal cords under direct vision,  breath sounds checked- equal and bilateral and positive ETCO2 Secured at: 23 (cm at teeth) cm Tube secured with: Tape Dental Injury: Teeth and Oropharynx as per pre-operative assessment

## 2012-06-26 NOTE — Transfer of Care (Signed)
Immediate Anesthesia Transfer of Care Note  Patient: [name]  Procedure(s) Performed: Procedure(s) (LRB): PACEMAKER LEAD REMOVAL (N/A)  Patient Location: PACU  Anesthesia Type: General  Level of Consciousness: awake and alert   Airway & Oxygen Therapy: Patient Spontanous Breathing and Patient connected to face mask oxygen  Post-op Assessment: Report given to PACU RN and Post -op Vital signs reviewed and stable  Post vital signs: Reviewed and stable  Complications: No apparent anesthesia complications

## 2012-06-26 NOTE — Preoperative (Signed)
Beta Blockers   Reason not to administer Beta Blockers:Coreg 0830 today

## 2012-06-26 NOTE — Op Note (Signed)
ICD system extraction performed without immediate complication. [id number].

## 2012-06-26 NOTE — Anesthesia Postprocedure Evaluation (Signed)
  Anesthesia Post-op Note  Patient: [name]  Procedure(s) Performed: Procedure(s) (LRB): PACEMAKER LEAD REMOVAL (N/A)  Patient Location: PACU  Anesthesia Type: General  Level of Consciousness: awake  Airway and Oxygen Therapy: Patient Spontanous Breathing  Post-op Pain: mild  Post-op Assessment: Post-op Vital signs reviewed  Post-op Vital Signs: stable  Complications: No apparent anesthesia complications 

## 2012-06-26 NOTE — Anesthesia Preprocedure Evaluation (Addendum)
Anesthesia Evaluation   Patient awake    Reviewed: Allergy & Precautions, H&P , NPO status , Patient's Chart, lab work & pertinent test results, reviewed documented beta blocker date and time   Airway Mallampati: II TM Distance: >3 FB Neck ROM: Full    Dental  (+) Teeth Intact and Dental Advisory Given   Pulmonary asthma ,          Cardiovascular hypertension, Pt. on home beta blockers and Pt. on medications + CAD, + Past MI and +CHF + dysrhythmias     Neuro/Psych    GI/Hepatic hiatal hernia, GERD-  Medicated and Controlled,  Endo/Other  Poorly Controlled, Type 2, Oral Hypoglycemic Agents  Renal/GU      Musculoskeletal   Abdominal   Peds  Hematology   Anesthesia Other Findings   Reproductive/Obstetrics                          Anesthesia Physical Anesthesia Plan  ASA: III  Anesthesia Plan:    Post-op Pain Management:    Induction:   Airway Management Planned:   Additional Equipment:   Intra-op Plan:   Post-operative Plan:   Informed Consent:   Plan Discussed with:   Anesthesia Plan Comments:         Anesthesia Quick Evaluation

## 2012-06-26 NOTE — Interval H&P Note (Signed)
History and Physical Interval Note:  06/26/2012 2:05 PM  [name]  has presented today for surgery, with the diagnosis of infected ICD  The various methods of treatment have been discussed with the patient and family. After consideration of risks, benefits and other options for treatment, the patient has consented to  Procedure(s) (LRB): PACEMAKER LEAD REMOVAL (N/A) as a surgical intervention .  The patient's history has been reviewed, patient examined, no change in status, stable for surgery.  I have reviewed the patient's chart and labs.  Questions were answered to the patient's satisfaction.     Lewayne Bunting

## 2012-06-27 ENCOUNTER — Encounter (HOSPITAL_COMMUNITY): Payer: Self-pay | Admitting: *Deleted

## 2012-06-27 ENCOUNTER — Inpatient Hospital Stay (HOSPITAL_COMMUNITY): Payer: Medicare Other

## 2012-06-27 DIAGNOSIS — T827XXA Infection and inflammatory reaction due to other cardiac and vascular devices, implants and grafts, initial encounter: Principal | ICD-10-CM

## 2012-06-27 LAB — CBC
HCT: 29.1 % — ABNORMAL LOW (ref 39.0–52.0)
MCH: 29.3 pg (ref 26.0–34.0)
MCV: 91.8 fL (ref 78.0–100.0)
RDW: 15.9 % — ABNORMAL HIGH (ref 11.5–15.5)
WBC: 5.6 10*3/uL (ref 4.0–10.5)

## 2012-06-27 LAB — BASIC METABOLIC PANEL
BUN: 37 mg/dL — ABNORMAL HIGH (ref 6–23)
CO2: 21 mEq/L (ref 19–32)
Chloride: 107 mEq/L (ref 96–112)
Creatinine, Ser: 2.02 mg/dL — ABNORMAL HIGH (ref 0.50–1.35)
Glucose, Bld: 197 mg/dL — ABNORMAL HIGH (ref 70–99)

## 2012-06-27 LAB — GLUCOSE, CAPILLARY
Glucose-Capillary: 261 mg/dL — ABNORMAL HIGH (ref 70–99)
Glucose-Capillary: 178 mg/dL — ABNORMAL HIGH (ref 70–99)

## 2012-06-27 MED ORDER — SODIUM CHLORIDE 0.9 % IJ SOLN
3.0000 mL | Freq: Three times a day (TID) | INTRAMUSCULAR | Status: DC
  Administered 2012-06-27 – 2012-06-28 (×4): 3 mL via INTRAVENOUS

## 2012-06-27 MED ORDER — SODIUM CHLORIDE 0.9 % IJ SOLN: 3.0000 mL | Freq: Three times a day (TID) | INTRAMUSCULAR | Status: DC

## 2012-06-27 NOTE — Progress Notes (Signed)
Pt. Noted to have multiple abrasions on BLE from mid calf to upper thighs.  Pt. States these are from a dog which lives with him which needs to have his nails clipped.  He appears to feel that having a dog's nails trimmed is an act of abuse as he needs to be taken to the vet for the procedure.  I assured him that this was not the case.  Several abrasions appear reddened, but none appear infected.  Advised patient that he needs to take particular care to avoid such occorances as he is diabetic.  He appears to understand the rational, but unwilling to disallow the dog from jumping on him.

## 2012-06-27 NOTE — Progress Notes (Signed)
SUBJECTIVE: The patient is doing well today s/p BiV ICD extraction.  He has stable extraction site pain.  At this time, he denies chest pain, shortness of breath, or any new concerns.     Marland Kitchen amiodarone  200 mg Oral BID  . carvedilol  9.375 mg Oral BID WC  . colchicine  0.6 mg Oral Daily  . donepezil  5 mg Oral QHS  . febuxostat  40 mg Oral Daily  . fentaNYL      . gabapentin  300 mg Oral TID  . hydrALAZINE  25 mg Oral TID  . HYDROcodone-acetaminophen  1 tablet Oral QHS  . HYDROmorphone      . insulin aspart  0-15 Units Subcutaneous TID WC  . insulin glargine  20 Units Subcutaneous QHS  . isosorbide mononitrate  90 mg Oral QHS  . pantoprazole  40 mg Oral Q1200  . sodium chloride  3 mL Intravenous Q8H  . Tamsulosin HCl  0.4 mg Oral Daily  . Ticagrelor  90 mg Oral BID  . vancomycin  1,250 mg Intravenous Q12H  . DISCONTD: chlorhexidine  60 mL Topical Once  . DISCONTD: gentamicin irrigation  80 mg Irrigation On Call  . DISCONTD: HYDROcodone-acetaminophen  1 tablet Oral QHS  . DISCONTD: insulin aspart  1-12 Units Subcutaneous TID AC  . DISCONTD: sodium chloride  3 mL Intravenous Q8H  . DISCONTD: vancomycin  1,000 mg Intravenous On Call      . DISCONTD: lactated ringers      OBJECTIVE: Physical Exam: Filed Vitals:   06/26/12 2334 06/27/12 0014 06/27/12 0515 06/27/12 1404  BP: 118/68 142/75 109/78 96/58  Pulse: 67 66 64 71  Temp:   98.2 F (36.8 C) 99 F (37.2 C)  TempSrc:   Oral Oral  Resp: 16 16 16 17   Height:      Weight:   214 lb 14.4 oz (97.478 kg)   SpO2: 98% 98% 98% 95%    Intake/Output Summary (Last 24 hours) at 06/27/12 1839 Last data filed at 06/27/12 1754  Gross per 24 hour  Intake   1220 ml  Output   1150 ml  Net     70 ml    Telemetry reveals sinus rhythm  GEN- The patient is well appearing, alert and oriented x 3 today.   Head- normocephalic, atraumatic Eyes-  Sclera clear, conjunctiva pink Ears- hearing intact Oropharynx- clear Neck- supple,  no JVP Lymph- no cervical lymphadenopathy Lungs- Clear to ausculation bilaterally, normal work of breathing Heart- Regular rate and rhythm  GI- soft, NT, ND, + BS Extremities- no clubbing, cyanosis, or edema ICD extraction site with dressing clean and dry  LABS: Basic Metabolic Panel:  Basename 06/27/12 0001 06/26/12 1344 06/25/12 1409  NA 139 143 --  K 4.6 4.4 --  CL 107 -- 101  CO2 21 -- 24  GLUCOSE 197* 181* --  BUN 37* -- 39*  CREATININE 2.02* -- 2.09*  CALCIUM 8.7 -- 9.1  MG -- -- --  PHOS -- -- --   Liver Function Tests:  Basename 06/25/12 1409  AST 33  ALT 28  ALKPHOS 68  BILITOT 0.6  PROT 7.0  ALBUMIN 3.2*   No results found for this basename: LIPASE:2,AMYLASE:2 in the last 72 hours CBC:  Basename 06/27/12 0001 06/26/12 1344  WBC 5.6 --  NEUTROABS -- --  HGB 9.3* 10.5*  HCT 29.1* 31.0*  MCV 91.8 --  PLT 156 --    ASSESSMENT AND PLAN:  Active Problems:  *  No active hospital problems. *  1. ICD pocket infection- doing well s/p extraction Dr Ladona Ridgel to remove dressing in am Continue antibiotics Will need lifevest at discharge  2. Ischemic CM- stable, without symptoms of ischemia  3. Recent R sided weakness (last admit) PT to assess in am to see if he requires SNF or can go home  4. Dispo- per SW  Hillis Range, MD 06/27/2012 6:39 PM

## 2012-06-27 NOTE — Clinical Social Work Placement (Addendum)
Clinical Social Work Department CLINICAL SOCIAL WORK PLACEMENT NOTE 06/27/2012  Patient:  [name], [name]  Account Number:  [id number] Admit date:  06/26/2012  Clinical Social Worker:  Santiago Stenzel Lubertha Basque  Date/time:  06/27/2012 03:00 PM  Clinical Social Work is seeking post-discharge placement for this patient at the following level of care:   SKILLED NURSING   (*CSW will update this form in Epic as items are completed)   06/27/2012  Patient/family provided with [facility] Department of Clinical Social Work's list of facilities offering this level of care within the geographic area requested by the patient (or if unable, by the patient's family).  06/27/2012  Patient/family informed of their freedom to choose among providers that offer the needed level of care, that participate in Medicare, Medicaid or managed care program needed by the patient, have an available bed and are willing to accept the patient.  06/27/2012  Patient/family informed of [facility]' ownership interest in [facility], as well as of the fact that they are under no obligation to receive care at this facility.  PASARR submitted to EDS on 06/27/2012 PASARR number received from EDS on   FL2 transmitted to all facilities in geographic area requested by pt/family on  06/27/2012 FL2 transmitted to all facilities within larger geographic area on   Patient informed that his/her managed care company has contracts with or will negotiate with  certain facilities, including the following:     Patient/family informed of bed offers received:  06/28/12  Patient chooses bed at [facility] Physician recommends and patient chooses bed at  [facility]  Patient to be transferred to  on  06/28/12  Patient to be transferred to facility by  Private Vehicle The following physician request were entered in Epic:   Additional Comments:  Sabino Niemann, MSW, [employer] [phone]

## 2012-06-27 NOTE — Progress Notes (Signed)
Clinical Social Work Department BRIEF PSYCHOSOCIAL ASSESSMENT 06/27/2012  Patient:  [name], [name]     Account Number:  [id number]     Admit date:  06/26/2012  Clinical Social Worker:  Juliette Mangle  Date/Time:  06/27/2012 03:00 PM  Referred by:  Physician  Date Referred:  06/27/2012 Referred for  SNF Placement   Other Referral:   Interview type:  Patient Other interview type:    PSYCHOSOCIAL DATA Living Status:  FAMILY Admitted from facility:   Level of care:   Primary support name:  Lawana Chambers Primary support relationship to patient:  SIBLING Degree of support available:   Fair    CURRENT CONCERNS Current Concerns  Post-Acute Placement   Other Concerns:    SOCIAL WORK ASSESSMENT / PLAN CSW  received referral for SNF placement. CSW met with pt to address consult. CSW introduced self and explained role of social work. Pt lives with brother in [place] and feels that he would benefit from ST-SNF to build up his strength. Pt has been to SNF in the past. CSW explained process of SNF placement. Patient is agreeable to placement and reported that his top two choices are [facility] [place] and [facility] and [address]. Patient will need to go to a facility with a lifevest which CSW discussed with the patient.  CSW will initiate SNF search and follow up with bed offers. CSW will continue to follow.   Assessment/plan status:  Psychosocial Support/Ongoing Assessment of Needs Other assessment/ plan:   Information/referral to community resources:   Patient was given choice list    PATIENT'S/FAMILY'S RESPONSE TO PLAN OF CARE: Patient was appreciative of information and support proided by CSW. CSW will continue to follow and assist with all d/c needs.    *CSW is awaiting PT consult  Sabino Niemann, MSW, [place] [phone]

## 2012-06-27 NOTE — Op Note (Signed)
NAMEREAD, BONELLI NO.:  [id number]  MEDICAL RECORD NO.:  [id number]  LOCATION:  4732                         FACILITY:  [facility]  PHYSICIAN:  Doylene Canning. Ladona Ridgel, MD    DATE OF BIRTH:  1956-07-07  DATE OF PROCEDURE:  06/26/2012 DATE OF DISCHARGE:                              OPERATIVE REPORT   PROCEDURE PERFORMED:  Extraction of a biventricular ICD system.  INDICATION:  Impending ICD pocket erosion in a patient with ICD pocket swelling and recurrent fevers.  INTRODUCTION:  The patient is a 56 year old male with longstanding ischemic cardiomyopathy, status post MI.  He has severe left ventricular dysfunction with ejection fraction of 25%.  He has left bundle-branch block and underwent BiV ICD implantation in 2005.  He underwent removal of his old device and insertion of a new one with insertion of a new right ventricular defibrillation lead two years ago.  The patient within few months of his procedure developed swelling over the ICD pocket site. Over the last few weeks, the pocket has become reddened and the skin on the lateral portion of the pocket has thinned out such that it is paper thin and pending erosion.  In addition, the patient has had subjective fevers and chills, though his cultures had been sterile.  He is now referred for extraction of his entire system.  PROCEDURE:  After informed consent was obtained, the patient was taken to the operating room in the fasting state.  After induction of general anesthesia carried out under the direction of Dr. Michelle Piper, 30 mL of lidocaine was infiltrated over the old ICD insertion site and a 9-cm incision was carried out over this region.  Electrocautery was utilized to dissect down to the ICD pocket.  There was evidence of chronic active inflammation such that the scar around the ICD pocket was approximately 3 cm.  In addition, the leads were encased in fibrous scar tissue. There were very small amounts of purulent  material within this tissue. It took approximately an hour and a half to debride the extensive scar tissue surrounding the defibrillator and pacing leads.  Having accomplished this, the stylets were advanced into the leads and we were able and the helices of the leads were retracted.  At this point, liberator locking stylets were placed in each of the four leads. Initial attention was turned towards the atrial lead.  An 11-French Adriana Simas left-to-right Evolution dissection sheath was advanced over the atrial lead and down into the left subclavian vein and innominate vein junction.  At this point, a longer Adriana Simas 11-French left-to-right Evolution sheath was advanced over the lead, but bound up at the junction between the innominate vein and the superior vena cava.  There was dense fibrous scar tissue in this location.  Attention was then turned towards the LV lead.  An 0.014 Mailman angioplasty guidewire was advanced into the left ventricular lead and out to the tip of the lead. Gentle traction was placed on the lead, but it was also stuck.  Next, attention was then turned to the newly implanted defibrillator lead, which was approximately 57 years old.  This was a St. Jude lead and the helix was retracted into the  body of the lead.  The 11-French [facility] sheath was advanced over the defibrillator lead and entered the subclavian venous system.  At this point, the 11-French [facility] left-to- right dissection sheath was advanced over the lead and down into the right atrium.  With gentle traction, the lead was removed.  At this point, attention was then turned towards removal of the LV lead.  This was carried out again with a liberator locking stylet advanced into the LV lead and then the 11-French Shortie Cook left-to-right dissection sheath was advanced into the innominate vein.  At this point, the longer left-to-right [facility] dissection sheath was advanced down to the proximal portion of the distal  coil.  At this point, no additional movement could be made on the lead and it was remained fixed.  A 13-French Cook left-to- right dissection sheath was then advanced over the previously implanted defibrillator lead (2005) and utilizing the combination of traction and counter traction pressure and counter pressure, the lead was removed in total.  It should be noted that traction on the lead did result in partial invagination of the right ventricular lead and subsequent drop in blood pressure, however, with release of traction blood pressure would always return to normal.  Finally, the atrial lead remained.  The [facility] dissection sheath was readvanced into the subclavian vein and then down to the innominate vein and superior vena cava junction.  At this point, gentle traction on the atrial lead resulted in its removal entirely.  At this point, all the leads were removed and the hemostasis was obtained by gentle traction and pressure being placed over the ICD pocket.  Electrocautery was utilized to debride the scar tissue.  The pocket was irrigated with antibiotic irrigation, then the incision was closed with Prolene suture.  A pressure dressing was placed and the patient was returned to the recovery area in satisfactory condition.  COMPLICATIONS:  There were no immediate procedure complications.  RESULTS:  This demonstrates successful extraction of a Medtronic biventricular ICD system in total along with successful extraction of an extra defibrillation lead.  It is anticipated that the patient will have a LifeVest placed at discharge and return in approximately 4-6 weeks for insertion of the device from the contralateral side.     Doylene Canning. Ladona Ridgel, MD     GWT/MEDQ  D:  06/26/2012  T:  06/27/2012  Job:  [id number]  cc:   Gerlene Burdock A. Alanda Amass, M.D.

## 2012-06-28 LAB — GLUCOSE, CAPILLARY

## 2012-06-28 LAB — CBC
HCT: 27.7 % — ABNORMAL LOW (ref 39.0–52.0)
Hemoglobin: 8.9 g/dL — ABNORMAL LOW (ref 13.0–17.0)
MCH: 29.5 pg (ref 26.0–34.0)
MCHC: 32.1 g/dL (ref 30.0–36.0)
MCV: 91.7 fL (ref 78.0–100.0)

## 2012-06-28 MED ORDER — ACETAMINOPHEN 325 MG PO TABS: 325.0000 mg | ORAL_TABLET | ORAL | Status: DC | PRN

## 2012-06-28 MED ORDER — CARVEDILOL 6.25 MG PO TABS: 6.2500 mg | ORAL_TABLET | Freq: Two times a day (BID) | ORAL | Status: DC

## 2012-06-28 MED ORDER — CLINDAMYCIN HCL 300 MG PO CAPS: 300.0000 mg | ORAL_CAPSULE | Freq: Three times a day (TID) | ORAL | Status: AC

## 2012-06-28 MED ORDER — BISACODYL 5 MG PO TBEC
5.0000 mg | DELAYED_RELEASE_TABLET | Freq: Every day | ORAL | Status: DC | PRN
  Administered 2012-06-28: 5 mg via ORAL
  Filled 2012-06-28: qty 1

## 2012-06-28 MED ORDER — SENNA 8.6 MG PO TABS
1.0000 | ORAL_TABLET | Freq: Every day | ORAL | Status: DC
  Administered 2012-06-28: 8.6 mg via ORAL
  Filled 2012-06-28: qty 1

## 2012-06-28 NOTE — Progress Notes (Signed)
Utilization Review Completed.  

## 2012-06-28 NOTE — Progress Notes (Signed)
Pt A/O x 3, pt D/C'd to [facility] brought by family via wheelchair, pt D/C instructions, follow up appointments, and prescriptions given, pt left with no s/s of distress Worthy Flank, RN

## 2012-06-28 NOTE — Progress Notes (Signed)
Patient has a bed at [facility].  CSW contacted representatives from [employer] to inform them of this. They are working on getting auth for patient's life vest. CSW will continue to assist with all d/c needs.  Sabino Niemann, MSW, [employer] [phone]

## 2012-06-28 NOTE — Discharge Summary (Signed)
ELECTROPHYSIOLOGY DISCHARGE SUMMARY    Patient ID: [name],  MRN: [id number], DOB/AGE: 1956/07/18 56 y.o.  Admit date: 06/26/2012 Discharge date: 06/28/2012  Primary Care Physician: Angelina Pih, MD Primary Cardiologist: Alanda Amass, MD  Primary Discharge Diagnosis:  1. ICD pocket infection s/p BiV ICD system removal  Secondary Discharge Diagnoses:  1. Ischemic CM with chronic systolic CHF - continue medical therapy and routine follow-up with Dr. Alanda Amass 2. Recent R-sided weakness - continue PT at SNF  3. CKD - baseline Cr ~2.0  4. Anemia - will need close follow-up as outpatient  Procedures This Admission:   1. BiV ICD system removal/extraction 06/26/2012 Conclusions: successful extraction of a Medtronic biventricular ICD system in total along with successful extraction of an extra defibrillation lead. It is anticipated that the patient will have a LifeVest placed at discharge and return in approximately 4-6 weeks for insertion of the device from the contralateral side.  History and Hospital Course:  [name] is a 56 year old gentleman with an ischemic CM, prior VF arrest s/p ICD implantation in 2006 with upgrade to BiV ICD with CRT in January 2011, severe CAD, insulin-dependent diabetes, hyperlipidemia, hypertension, chronic kidney disease-stage III, atrial fibrillation, stroke and asthma who was recently admitted with right sided weakness and is undergoing neurologic work-up. While here, he reported pain and swelling at his ICD implant site. More recently he has noticed increasing pain and swelling at the site x 2 months. He also reports "a boil" over the lateral edge about 2 months ago which he drained at home by himself. He was given oral antibiotics by his PCP and scheduled for follow-up at [facility]. He was seen by Dr. Alanda Amass on 05/23/2012. During his recent hospitalization, this was evaluated by his primary cardiologist who requested EP consultation. He was  evaluated by Dr. Johney Frame who recommended BiV ICD system removal/extraction for pocket infection. He underwent BiV ICD system extraction performed by Dr. Ladona Ridgel on 06/26/2012. He tolerated this procedure well without immediate complication. He has moderate incisional pain post-procedure. His explant site is intact without bleeding or hematoma. He remains hemodynamically stable and afebrile. He is euvolemic by exam and his CAD is stable as he has no anginal symptoms. He has been evaluated by PT. He has been educated and fitted for LifeVest. He will continue oral antibiotics for 5 days. He will return in approximately 4-6 for BiV ICD implantation on the contralateral side. He has been seen, examined and deemed stable for discharge to SNF today by Dr. Lewayne Bunting.  Discharge Vitals: Blood pressure 104/54, pulse 60, temperature 97.6 F (36.4 C), temperature source Oral, resp. rate 19, height 5\' 9"  (1.753 m), weight 217 lb 11.2 oz (98.748 kg), SpO2 96.00%.   Labs: Lab Results  Component Value Date   WBC 4.8 06/28/2012   HGB 8.9* 06/28/2012   HCT 27.7* 06/28/2012   MCV 91.7 06/28/2012   PLT 180 06/28/2012     Lab 06/27/12 0001 06/25/12 1409  NA 139 --  K 4.6 --  CL 107 --  CO2 21 --  BUN 37* --  CREATININE 2.02* --  CALCIUM 8.7 --  PROT -- 7.0  BILITOT -- 0.6  ALKPHOS -- 68  ALT -- 28  AST -- 33  GLUCOSE 197* --   Lab Results  Component Value Date   CKTOTAL 147 06/17/2012   CKMB 4.3* 06/17/2012   TROPONINI <0.30 06/17/2012    Disposition:  The patient is being discharged in stable condition.  Follow-up: Follow-up Information  Follow up with [facility] [facility] on 07/04/2012. (At 11:30 AM)    Contact information:   [facility] [address] [place] [address] [phone]       Follow up with Lewayne Bunting, MD. (In 4-6 weeks for BiV ICD re-implantation - Our office will call you with your appointment date and time)    Contact information:    [facility] [address] [place] [address] [phone]        Discharge Medications:  Medication List  As of 06/28/2012  4:10 PM   TAKE these medications         acetaminophen 325 MG tablet   Commonly known as: TYLENOL   Take 1-2 tablets (325-650 mg total) by mouth every 4 (four) hours as needed.      amiodarone 200 MG tablet   Commonly known as: PACERONE   Take 200 mg by mouth 2 (two) times daily.      carvedilol 6.25 MG tablet   Commonly known as: COREG   Take 1 tablet (6.25 mg total) by mouth 2 (two) times daily with a meal.      clindamycin 300 MG capsule   Commonly known as: CLEOCIN   Take 1 capsule (300 mg total) by mouth 3 (three) times daily. For 5 days then stop.      colchicine 0.6 MG tablet   Take 0.6 mg by mouth daily.      donepezil 5 MG tablet   Commonly known as: ARICEPT   Take 5 mg by mouth at bedtime.      febuxostat 40 MG tablet   Commonly known as: ULORIC   Take 40 mg by mouth daily.      fenofibrate 145 MG tablet   Commonly known as: TRICOR   Take 145 mg by mouth daily.      gabapentin 300 MG capsule   Commonly known as: NEURONTIN   Take 300 mg by mouth 3 (three) times daily.      hydrALAZINE 25 MG tablet   Commonly known as: APRESOLINE   Take 25 mg by mouth 3 (three) times daily.      HYDROcodone-acetaminophen 5-500 MG per tablet   Commonly known as: VICODIN   Take 1 tablet by mouth at bedtime.      insulin aspart 100 UNIT/ML injection   Commonly known as: novoLOG   Inject 1-12 Units into the skin 3 (three) times daily before meals.      insulin glargine 100 UNIT/ML injection   Commonly known as: LANTUS   Inject 20 Units into the skin at bedtime.      isosorbide mononitrate 60 MG 24 hr tablet   Commonly known as: IMDUR   Take 90 mg by mouth at bedtime.      magnesium oxide 400 MG tablet   Commonly known as: MAG-OX   Take 400 mg by mouth 2 (two) times daily.      nitroGLYCERIN 0.4 MG SL  tablet   Commonly known as: NITROSTAT   Place 0.4 mg under the tongue every 5 (five) minutes as needed. For chest pain      omeprazole 20 MG capsule   Commonly known as: PRILOSEC   Take 40 mg by mouth 2 (two) times daily.      Tamsulosin HCl 0.4 MG Caps   Commonly known as: FLOMAX   Take 0.4 mg by mouth daily.      Ticagrelor 90  MG Tabs tablet   Commonly known as: BRILINTA   Take 90 mg by mouth 2 (two) times daily.      vitamin B-12 250 MCG tablet   Commonly known as: CYANOCOBALAMIN   Take 250 mcg by mouth daily.          Duration of Discharge Encounter: Greater than 30 minutes including physician time.  Signed, Rick Duff, PA-C 06/28/2012, 4:10 PM

## 2012-06-28 NOTE — Progress Notes (Signed)
Patient: [name] Date of Encounter: 06/28/2012, 9:07 AM Admit date: 06/26/2012     Subjective  [name] states he is tired and nauseated. He denies CP, SOB and palpitations.   Objective  Physical Exam: Vitals: BP 120/60  Pulse 67  Temp 98 F (36.7 C) (Oral)  Resp 18  Ht 5\' 9"  (1.753 m)  Wt 217 lb 11.2 oz (98.748 kg)  BMI 32.15 kg/m2  SpO2 100% General: Well developed, well appearing 56 year old male in no acute distress. Neck: Supple. JVD not elevated. Lungs: Clear bilaterally to auscultation without wheezes, rales, or rhonchi. Breathing is unlabored. Heart: RRR S1 S2 without murmurs, rub or gallop.  Abdomen: Soft, non-distended. Extremities: No clubbing or cyanosis. No edema.   Neuro: Alert and oriented X 3. Moves all extremities spontaneously. No focal deficits. Skin: ICD explant site intact without significant bleeding or drainage. No hematoma.  Intake/Output: Intake/Output Summary (Last 24 hours) at 06/28/12 0907 Last data filed at 06/28/12 0854  Gross per 24 hour  Intake   1050 ml  Output    925 ml  Net    125 ml   Inpatient Medications:  . amiodarone  200 mg Oral BID  . carvedilol  9.375 mg Oral BID WC  . colchicine  0.6 mg Oral Daily  . donepezil  5 mg Oral QHS  . febuxostat  40 mg Oral Daily  . gabapentin  300 mg Oral TID  . hydralazine  25 mg Oral TID  . insulin aspart  0-15 Units Subcutaneous TID WC  . insulin glargine  20 Units Subcutaneous QHS  . isosorbide mononitrate  90 mg Oral QHS  . pantoprazole  40 mg Oral Q1200  . senna  1 tablet Oral Daily  . sodium chloride  3 mL Intravenous Q8H  . tamsulosin HCl  0.4 mg Oral Daily  . ticagrelor  90 mg Oral BID  . vancomycin  1,250 mg Intravenous Q12H   Labs:  Basename 06/27/12 0001 06/26/12 1344 06/25/12 1409  NA 139 143 --  K 4.6 4.4 --  CL 107 -- 101  CO2 21 -- 24  GLUCOSE 197* 181* --  BUN 37* -- 39*  CREATININE 2.02* -- 2.09*  CALCIUM 8.7 -- 9.1  MG -- -- --  PHOS -- -- --     Basename 06/25/12 1409  AST 33  ALT 28  ALKPHOS 68  BILITOT 0.6  PROT 7.0  ALBUMIN 3.2*    Basename 06/27/12 0001 06/26/12 1344  WBC 5.6 --  NEUTROABS -- --  HGB 9.3* 10.5*  HCT 29.1* 31.0*  MCV 91.8 --  PLT 156 --    Radiology/Studies: Dg Chest 2 View 06/27/2012    IMPRESSION:  1.  No pneumothorax or other acute finding. 2. Subtle nodularity projecting over the left upper lobe.  If there are infectious symptoms, this could be reevaluated on follow-up after antibiotics.  If not, further evaluation with follow-up plain films at approximately 3 months versus further characterization with contrast enhanced chest CT should be considered.   These results will be called to the ordering clinician or representative by the Radiologist Assistant, and communication documented in the PACS Dashboard.   Original Report Authenticated By: Consuello Bossier, M.D.   Telemetry: normal sinus rhythm; no arrhythmias   Assessment and Plan  1. ICD pocket infection s/p BiV ICD system extraction 06/26/2012 - will need LifeVest prior to discharge; continue antibiotics 2. Ischemic CM with chronic systolic CHF - euvolemic  on exam this AM; continue medical therapy 3. Recent R-sided weakness - PT to evaluate this AM 4. CKD - stable; it appears his baseline Cr ~2.0 5. Anemia - Hgb 11.5 on admission; no overt signs of bleeding; repeat CBC this AM  Dr. Ladona Ridgel to see and make further recommendations. Signed, Andrienne Havener PA-C

## 2012-06-28 NOTE — Evaluation (Signed)
Physical Therapy Evaluation Patient Details Name: [name] MRN: [id number] DOB: 12-08-1955 Today's Date: 06/28/2012 Time: 4098-1191 PT Time Calculation (min): 25 min  PT Assessment / Plan / Recommendation Clinical Impression  pt admitted for ICD pocket infection and removal ICD.  Pt continues to be significantly weak with little activity tolerance and decr balance.  Recommend SNF for rehab .    PT Assessment  @@@@All  further PT needs can be met in the next venue of care @@@@   Follow Up Recommendations  Skilled nursing facility;Supervision for mobility/OOB    Barriers to Discharge        Equipment Recommendations  Defer to next venue    Recommendations for Other Services     Frequency      Precautions / Restrictions Precautions Precautions: Fall Precaution Comments: Pt with h/o numerous falls per pt   Pertinent Vitals/Pain       Mobility  Bed Mobility Bed Mobility: Not assessed Transfers Transfers: Sit to Stand;Stand to Sit Sit to Stand: 4: Min assist;With upper extremity assist;From chair/3-in-1 Stand to Sit: 4: Min assist;With upper extremity assist;To chair/3-in-1 Details for Transfer Assistance: vc/tc's for hand placement; minimal lifting and stability assist Ambulation/Gait Ambulation/Gait Assistance: 4: Min assist Ambulation Distance (Feet): 18 Feet Assistive device: Straight cane Ambulation/Gait Assistance Details: tentative gait, short step length, little flexion at knees Gait Pattern: Step-to pattern;Decreased stride length;Decreased hip/knee flexion - right;Decreased hip/knee flexion - left;Right foot flat;Left foot flat;Wide base of support Gait velocity: severely impaired Stairs: No Wheelchair Mobility Wheelchair Mobility: No Modified Rankin (Stroke Patients Only) Modified Rankin: Moderately severe disability    Exercises     PT Diagnosis:    PT Problem List: Decreased strength;Decreased activity tolerance;Decreased balance PT Treatment  Interventions:     PT Goals    Visit Information  Last PT Received On: 06/28/12 Assistance Needed: +1    Subjective Data  Subjective: I'm definitely going to the nursing home for rehab now Patient Stated Goal: Go to skilled rehab   Prior Functioning  Home Living Lives With: Other (Comment) (lived with brother in rental trailer) Type of Home: Other (Comment) (going to skilled nursing facility) Home Adaptive Equipment: Straight cane Prior Function Level of Independence: Needs assistance Driving: Yes Vocation: On disability Communication Communication: No difficulties Dominant Hand: Right    Cognition  Overall Cognitive Status: Appears within functional limits for tasks assessed/performed Arousal/Alertness: Awake/alert Orientation Level: Oriented X4 / Intact Behavior During Session: [facility] for tasks performed    Extremity/Trunk Assessment Right Upper Extremity Assessment RUE ROM/Strength/Tone: Deficits RUE ROM/Strength/Tone Deficits: tested at 3/5 Left Upper Extremity Assessment LUE ROM/Strength/Tone: Deficits LUE ROM/Strength/Tone Deficits: demonstrated decreased shoulder flexion until encouraged to try to reach higher; strength WFL except grip (4/5) LUE Sensation: WFL - Light Touch Right Lower Extremity Assessment RLE ROM/Strength/Tone: Deficits RLE ROM/Strength/Tone Deficits: Pt with inconsistent effort--with no resistance, pt 3-/5; however with resistance given eccentrically at various speeds, pt with 5/5 knee extension and flexion RLE Sensation: History of peripheral neuropathy Left Lower Extremity Assessment LLE ROM/Strength/Tone: Deficits LLE ROM/Strength/Tone Deficits: Pt with inconsistent effort--with no resistance, pt 3-/5; however with resistance given eccentrically at various speeds, pt with 4/5 knee extension and flexion LLE Sensation: History of peripheral neuropathy Trunk Assessment Trunk Assessment: Normal   Balance    End of Session PT - End of  Session Equipment Utilized During Treatment: Gait belt Activity Tolerance: Patient tolerated treatment well;Patient limited by fatigue Patient left: in chair;with call bell/phone within reach Nurse Communication: Mobility status  GP  Lajuanna Pompa, Eliseo Gum 06/28/2012, 1:56 PM  06/28/2012  [place] Bing, PT [phone] (pager)

## 2012-06-28 NOTE — Progress Notes (Signed)
Inpatient Diabetes Program Recommendations  AACE/ADA: New Consensus Statement on Inpatient Glycemic Control (2013)  Target Ranges:  Prepandial:   less than 140 mg/dL      Peak postprandial:   less than 180 mg/dL (1-2 hours)      Critically ill patients:  140 - 180 mg/dL   Reason for Visit: Hyperglycemia  Inpatient Diabetes Program Recommendations Insulin - Basal: Fasting glucose high at 241 mg/dl.  Please increase lantus to 30 units at HS Insulin - Meal Coverage: Noterd during last hospitalization that pt was ordered 4 units Novolog meal coverage tidwc.  Please add here as well.  Note: Thank you, Lenor Coffin, RN, CNS, Diabetes Coordinator [phone])

## 2012-06-28 NOTE — Progress Notes (Signed)
CSW is awaiting Life Vest placement. Patient can DC to SNF after placement. Clinical social worker assisted with patient discharge to skilled nursing facility, [facility] and [address].CSW addressed all family questions and concerns. CSW copied chart and added all important documents. .Patient transported by private vehicle with patient chart copy. .No further Clinical Social Work needs, signing off.   Sabino Niemann, MSW, [employer] [phone]

## 2012-07-04 ENCOUNTER — Ambulatory Visit (INDEPENDENT_AMBULATORY_CARE_PROVIDER_SITE_OTHER): Payer: Medicare Other | Admitting: *Deleted

## 2012-07-04 DIAGNOSIS — I428 Other cardiomyopathies: Secondary | ICD-10-CM

## 2012-07-04 LAB — ICD DEVICE OBSERVATION

## 2012-07-07 ENCOUNTER — Encounter (HOSPITAL_COMMUNITY): Payer: Self-pay

## 2012-07-07 ENCOUNTER — Inpatient Hospital Stay (HOSPITAL_COMMUNITY)
Admission: EM | Admit: 2012-07-07 | Discharge: 2012-07-09 | DRG: 292 | Disposition: A | Discharge status: Skilled Nursing Facility | Payer: Medicare Other | Source: Other Acute Inpatient Hospital | Attending: Internal Medicine | Admitting: Internal Medicine

## 2012-07-07 DIAGNOSIS — R531 Weakness: Secondary | ICD-10-CM

## 2012-07-07 DIAGNOSIS — I472 Ventricular tachycardia, unspecified: Secondary | ICD-10-CM

## 2012-07-07 DIAGNOSIS — I2510 Atherosclerotic heart disease of native coronary artery without angina pectoris: Secondary | ICD-10-CM | POA: Diagnosis present

## 2012-07-07 DIAGNOSIS — B999 Unspecified infectious disease: Secondary | ICD-10-CM

## 2012-07-07 DIAGNOSIS — N368 Other specified disorders of urethra: Secondary | ICD-10-CM | POA: Diagnosis present

## 2012-07-07 DIAGNOSIS — I2589 Other forms of chronic ischemic heart disease: Secondary | ICD-10-CM | POA: Diagnosis present

## 2012-07-07 DIAGNOSIS — R197 Diarrhea, unspecified: Secondary | ICD-10-CM

## 2012-07-07 DIAGNOSIS — I739 Peripheral vascular disease, unspecified: Secondary | ICD-10-CM | POA: Diagnosis present

## 2012-07-07 DIAGNOSIS — I509 Heart failure, unspecified: Secondary | ICD-10-CM | POA: Diagnosis present

## 2012-07-07 DIAGNOSIS — R509 Fever, unspecified: Secondary | ICD-10-CM

## 2012-07-07 DIAGNOSIS — I5023 Acute on chronic systolic (congestive) heart failure: Principal | ICD-10-CM | POA: Diagnosis present

## 2012-07-07 DIAGNOSIS — I480 Paroxysmal atrial fibrillation: Secondary | ICD-10-CM

## 2012-07-07 DIAGNOSIS — N183 Chronic kidney disease, stage 3 unspecified: Secondary | ICD-10-CM | POA: Diagnosis present

## 2012-07-07 DIAGNOSIS — Z794 Long term (current) use of insulin: Secondary | ICD-10-CM

## 2012-07-07 DIAGNOSIS — E119 Type 2 diabetes mellitus without complications: Secondary | ICD-10-CM | POA: Diagnosis present

## 2012-07-07 DIAGNOSIS — N138 Other obstructive and reflux uropathy: Secondary | ICD-10-CM | POA: Diagnosis present

## 2012-07-07 DIAGNOSIS — R1110 Vomiting, unspecified: Secondary | ICD-10-CM | POA: Diagnosis present

## 2012-07-07 DIAGNOSIS — R079 Chest pain, unspecified: Secondary | ICD-10-CM | POA: Diagnosis present

## 2012-07-07 DIAGNOSIS — E785 Hyperlipidemia, unspecified: Secondary | ICD-10-CM | POA: Diagnosis present

## 2012-07-07 DIAGNOSIS — R339 Retention of urine, unspecified: Secondary | ICD-10-CM | POA: Diagnosis present

## 2012-07-07 DIAGNOSIS — I4729 Other ventricular tachycardia: Secondary | ICD-10-CM | POA: Diagnosis present

## 2012-07-07 DIAGNOSIS — N139 Obstructive and reflux uropathy, unspecified: Secondary | ICD-10-CM | POA: Diagnosis present

## 2012-07-07 DIAGNOSIS — I255 Ischemic cardiomyopathy: Secondary | ICD-10-CM

## 2012-07-07 DIAGNOSIS — Z8673 Personal history of transient ischemic attack (TIA), and cerebral infarction without residual deficits: Secondary | ICD-10-CM

## 2012-07-07 DIAGNOSIS — Z79899 Other long term (current) drug therapy: Secondary | ICD-10-CM

## 2012-07-07 DIAGNOSIS — G8190 Hemiplegia, unspecified affecting unspecified side: Secondary | ICD-10-CM

## 2012-07-07 DIAGNOSIS — M129 Arthropathy, unspecified: Secondary | ICD-10-CM | POA: Diagnosis present

## 2012-07-07 DIAGNOSIS — I252 Old myocardial infarction: Secondary | ICD-10-CM

## 2012-07-07 DIAGNOSIS — T827XXA Infection and inflammatory reaction due to other cardiac and vascular devices, implants and grafts, initial encounter: Secondary | ICD-10-CM

## 2012-07-07 DIAGNOSIS — I10 Essential (primary) hypertension: Secondary | ICD-10-CM | POA: Diagnosis present

## 2012-07-07 DIAGNOSIS — N401 Enlarged prostate with lower urinary tract symptoms: Secondary | ICD-10-CM | POA: Diagnosis present

## 2012-07-07 DIAGNOSIS — E11649 Type 2 diabetes mellitus with hypoglycemia without coma: Secondary | ICD-10-CM

## 2012-07-07 DIAGNOSIS — I129 Hypertensive chronic kidney disease with stage 1 through stage 4 chronic kidney disease, or unspecified chronic kidney disease: Secondary | ICD-10-CM | POA: Diagnosis present

## 2012-07-07 DIAGNOSIS — Z8674 Personal history of sudden cardiac arrest: Secondary | ICD-10-CM

## 2012-07-07 LAB — BASIC METABOLIC PANEL
BUN: 40 mg/dL — ABNORMAL HIGH (ref 6–23)
Calcium: 8.9 mg/dL (ref 8.4–10.5)
Chloride: 98 mEq/L (ref 96–112)
Creatinine, Ser: 2.22 mg/dL — ABNORMAL HIGH (ref 0.50–1.35)
GFR calc Af Amer: 36 mL/min — ABNORMAL LOW (ref >90)
GFR calc non Af Amer: 31 mL/min — ABNORMAL LOW (ref >90)

## 2012-07-07 LAB — CBC
HCT: 29.2 % — ABNORMAL LOW (ref 39.0–52.0)
MCHC: 31.2 g/dL (ref 30.0–36.0)
Platelets: 213 10*3/uL (ref 150–400)
RDW: 18.7 % — ABNORMAL HIGH (ref 11.5–15.5)
WBC: 4.4 10*3/uL (ref 4.0–10.5)

## 2012-07-07 LAB — GLUCOSE, CAPILLARY: Glucose-Capillary: 118 mg/dL — ABNORMAL HIGH (ref 70–99)

## 2012-07-07 LAB — TROPONIN I: Troponin I: <0.3 ng/mL (ref <0.30)

## 2012-07-07 MED ORDER — ONDANSETRON HCL 4 MG/2ML IJ SOLN: 4.0000 mg | Freq: Four times a day (QID) | INTRAMUSCULAR | Status: DC | PRN

## 2012-07-07 MED ORDER — SODIUM CHLORIDE 0.9 % IJ SOLN: 3.0000 mL | INTRAMUSCULAR | Status: DC | PRN

## 2012-07-07 MED ORDER — SODIUM CHLORIDE 0.9 % IJ SOLN: 3.0000 mL | Freq: Two times a day (BID) | INTRAMUSCULAR | Status: DC

## 2012-07-07 MED ORDER — ISOSORBIDE MONONITRATE ER 30 MG PO TB24
90.0000 mg | ORAL_TABLET | Freq: Every day | ORAL | Status: DC
  Filled 2012-07-07: qty 1

## 2012-07-07 MED ORDER — DONEPEZIL HCL 5 MG PO TABS
5.0000 mg | ORAL_TABLET | Freq: Every day | ORAL | Status: DC
  Administered 2012-07-07 – 2012-07-08 (×2): 5 mg via ORAL
  Filled 2012-07-07 (×4): qty 1

## 2012-07-07 MED ORDER — TAMSULOSIN HCL 0.4 MG PO CAPS
0.4000 mg | ORAL_CAPSULE | Freq: Every day | ORAL | Status: DC
  Administered 2012-07-07 – 2012-07-09 (×3): 0.4 mg via ORAL
  Filled 2012-07-07 (×3): qty 1

## 2012-07-07 MED ORDER — ONDANSETRON HCL 4 MG/2ML IJ SOLN
INTRAMUSCULAR | Status: AC
  Administered 2012-07-07: 4 mg
  Filled 2012-07-07: qty 2

## 2012-07-07 MED ORDER — HYDROCODONE-ACETAMINOPHEN 5-325 MG PO TABS
1.0000 | ORAL_TABLET | Freq: Four times a day (QID) | ORAL | Status: DC | PRN
  Administered 2012-07-08 – 2012-07-09 (×3): 1 via ORAL
  Filled 2012-07-07 (×3): qty 1

## 2012-07-07 MED ORDER — HYDROMORPHONE HCL PF 1 MG/ML IJ SOLN: 0.5000 mg | INTRAMUSCULAR | Status: DC | PRN

## 2012-07-07 MED ORDER — CARVEDILOL 3.125 MG PO TABS
3.1250 mg | ORAL_TABLET | Freq: Two times a day (BID) | ORAL | Status: DC
  Administered 2012-07-07 – 2012-07-09 (×4): 3.125 mg via ORAL
  Filled 2012-07-07 (×7): qty 1

## 2012-07-07 MED ORDER — AMIODARONE HCL 200 MG PO TABS
200.0000 mg | ORAL_TABLET | Freq: Two times a day (BID) | ORAL | Status: DC
  Administered 2012-07-07 – 2012-07-09 (×5): 200 mg via ORAL
  Filled 2012-07-07 (×6): qty 1

## 2012-07-07 MED ORDER — COLCHICINE 0.6 MG PO TABS
0.6000 mg | ORAL_TABLET | Freq: Every day | ORAL | Status: DC
  Administered 2012-07-07 – 2012-07-09 (×3): 0.6 mg via ORAL
  Filled 2012-07-07 (×3): qty 1

## 2012-07-07 MED ORDER — FEBUXOSTAT 40 MG PO TABS
40.0000 mg | ORAL_TABLET | Freq: Every day | ORAL | Status: DC
  Administered 2012-07-07 – 2012-07-09 (×3): 40 mg via ORAL
  Filled 2012-07-07 (×3): qty 1

## 2012-07-07 MED ORDER — GABAPENTIN 300 MG PO CAPS
300.0000 mg | ORAL_CAPSULE | Freq: Three times a day (TID) | ORAL | Status: DC
  Administered 2012-07-07 – 2012-07-09 (×7): 300 mg via ORAL
  Filled 2012-07-07 (×9): qty 1

## 2012-07-07 MED ORDER — NITROGLYCERIN 0.4 MG SL SUBL: 0.4000 mg | SUBLINGUAL_TABLET | SUBLINGUAL | Status: DC | PRN

## 2012-07-07 MED ORDER — HYDRALAZINE HCL 25 MG PO TABS
25.0000 mg | ORAL_TABLET | Freq: Three times a day (TID) | ORAL | Status: DC
  Administered 2012-07-07 – 2012-07-09 (×5): 25 mg via ORAL
  Filled 2012-07-07 (×10): qty 1

## 2012-07-07 MED ORDER — ISOSORBIDE MONONITRATE ER 60 MG PO TB24
60.0000 mg | ORAL_TABLET | Freq: Every day | ORAL | Status: DC
  Administered 2012-07-07 – 2012-07-08 (×2): 60 mg via ORAL
  Filled 2012-07-07 (×3): qty 1

## 2012-07-07 MED ORDER — TICAGRELOR 90 MG PO TABS
90.0000 mg | ORAL_TABLET | Freq: Two times a day (BID) | ORAL | Status: DC
  Administered 2012-07-07 – 2012-07-09 (×5): 90 mg via ORAL
  Filled 2012-07-07 (×6): qty 1

## 2012-07-07 MED ORDER — INSULIN ASPART 100 UNIT/ML ~~LOC~~ SOLN
0.0000 [IU] | Freq: Three times a day (TID) | SUBCUTANEOUS | Status: DC
  Administered 2012-07-07: 2 [IU] via SUBCUTANEOUS
  Administered 2012-07-07 (×2): 3 [IU] via SUBCUTANEOUS
  Administered 2012-07-08 (×3): 2 [IU] via SUBCUTANEOUS
  Administered 2012-07-09 (×2): 5 [IU] via SUBCUTANEOUS

## 2012-07-07 MED ORDER — ACETAMINOPHEN 325 MG PO TABS: 650.0000 mg | ORAL_TABLET | ORAL | Status: DC | PRN

## 2012-07-07 MED ORDER — INSULIN ASPART 100 UNIT/ML ~~LOC~~ SOLN: 0.0000 [IU] | Freq: Every day | SUBCUTANEOUS | Status: DC

## 2012-07-07 MED ORDER — ONDANSETRON HCL 4 MG/5ML PO SOLN
4.0000 mg | Freq: Once | ORAL | Status: AC
Start: 2012-07-07 — End: 2012-07-07

## 2012-07-07 MED ORDER — ASPIRIN EC 81 MG PO TBEC
81.0000 mg | DELAYED_RELEASE_TABLET | Freq: Every day | ORAL | Status: DC
  Filled 2012-07-07 (×3): qty 1

## 2012-07-07 MED ORDER — PANTOPRAZOLE SODIUM 40 MG PO TBEC
80.0000 mg | DELAYED_RELEASE_TABLET | Freq: Every day | ORAL | Status: DC
  Administered 2012-07-07 – 2012-07-09 (×3): 80 mg via ORAL
  Filled 2012-07-07: qty 2
  Filled 2012-07-07 (×2): qty 1
  Filled 2012-07-07: qty 2

## 2012-07-07 MED ORDER — FUROSEMIDE 10 MG/ML IJ SOLN
60.0000 mg | Freq: Two times a day (BID) | INTRAMUSCULAR | Status: AC
  Administered 2012-07-07 – 2012-07-08 (×4): 60 mg via INTRAVENOUS
  Filled 2012-07-07 (×5): qty 6

## 2012-07-07 MED ORDER — OXYCODONE HCL 5 MG PO TABS: 5.0000 mg | ORAL_TABLET | ORAL | Status: DC | PRN

## 2012-07-07 MED ORDER — FENOFIBRATE 160 MG PO TABS
160.0000 mg | ORAL_TABLET | Freq: Every day | ORAL | Status: DC
  Administered 2012-07-07 – 2012-07-09 (×3): 160 mg via ORAL
  Filled 2012-07-07 (×3): qty 1

## 2012-07-07 MED ORDER — CARVEDILOL 6.25 MG PO TABS
6.2500 mg | ORAL_TABLET | Freq: Two times a day (BID) | ORAL | Status: DC
Start: 2012-07-07 — End: 2012-07-07
  Administered 2012-07-07: 6.25 mg via ORAL
  Filled 2012-07-07 (×3): qty 1

## 2012-07-07 MED ORDER — SODIUM CHLORIDE 0.9 % IV SOLN: 250.0000 mL | INTRAVENOUS | Status: DC | PRN

## 2012-07-07 MED ORDER — INSULIN GLARGINE 100 UNIT/ML ~~LOC~~ SOLN
10.0000 [IU] | Freq: Every day | SUBCUTANEOUS | Status: DC
  Administered 2012-07-07 – 2012-07-08 (×2): 10 [IU] via SUBCUTANEOUS

## 2012-07-07 MED ORDER — ENOXAPARIN SODIUM 40 MG/0.4ML ~~LOC~~ SOLN
40.0000 mg | Freq: Every day | SUBCUTANEOUS | Status: DC
  Administered 2012-07-07: 40 mg via SUBCUTANEOUS
  Filled 2012-07-07: qty 0.4

## 2012-07-07 MED ORDER — POTASSIUM CHLORIDE CRYS ER 20 MEQ PO TBCR
20.0000 meq | EXTENDED_RELEASE_TABLET | Freq: Two times a day (BID) | ORAL | Status: DC
  Administered 2012-07-07 – 2012-07-09 (×5): 20 meq via ORAL
  Filled 2012-07-07 (×6): qty 1

## 2012-07-07 NOTE — Progress Notes (Signed)
[facility] Progress Note [place] TEAM 1 - Stepdown/ICU TEAM   BILL YOHN ZOX:[id number] DOB: 08/29/1956 DOA: 07/07/2012 PCP: Galvin Proffer, MD  Brief narrative: 56 year old male with an extensive cardiac history including multiple myocardial infarctions, multiple stents, systolic heart failure, recent extraction of biventricular ICD secondary to infection on 06/27/2012. He is currently wearing a LifeVest. He was discharged to a skilled nursing facility. He just recently had his Lasix restarted 3 days ago, but has been having difficulty urinating. He awoke tonight with shortness of breath and substernal chest pain. He was taken to [facility] where he was evaluated. His first troponin was negative, but he had a pro-BNP of 8200 and chest x-ray supporting pulmonary edema. He was given Lasix 60 mg IV, but had not urinated. Due to his request and his cardiologist being in [place] he was transferred to [facility] for further workup and evaluation. Just prior to his arrival, he began to have green bilious emesis.   Assessment/Plan:  Chest pain with complex history of coronary artery disease Pain has entirely resolved - I suspect the sensation the patient's chest was more related to volume overload/pulmonary edema - we will complete a cycling of his cardiac enzymes  Acute exacerbation of Chronic systolic Congestive heart failure / ischemic cardiomyopathy Follow Is&Os and continue diuretic therapy  Urinary retention Likely related to BPH - keep the Foley today and attempt voiding trials tomorrow after titration of BPH meds  Vomiting Resolved  Peripheral vascular disease  History of V. fib arrest Status post AICD implantation 2006 with removal 06/20/2012 due to concern for pocket infection - is currently wearing a LifeVest  Diabetes mellitus  Hypertension  Chronic kidney disease stage III  Hyperlipidemia  History of stroke  Code Status: Full Disposition  Plan: Stable for transfer to cardiac telemetry floor  Consultants: None  Antibiotics: None  DVT prophylaxis: Lovenox  HPI/Subjective: Patient is seen for a followup visit   Objective: Blood pressure 113/56, pulse 60, temperature 98.9 F (37.2 C), temperature source Oral, resp. rate 13, height 5\' 9"  (1.753 m), weight 101.9 kg (224 lb 10.4 oz), SpO2 99.00%.  Intake/Output Summary (Last 24 hours) at 07/07/12 1352 Last data filed at 07/07/12 1200  Gross per 24 hour  Intake    240 ml  Output   2000 ml  Net  -1760 ml    Exam: Patient is seen for a followup exam  Data Reviewed: Basic Metabolic Panel:  Lab 07/07/12 8119  NA 138  K 3.6  CL 98  CO2 32  GLUCOSE 156*  BUN 40*  CREATININE 2.22*  CALCIUM 8.9  MG --  PHOS --   CBC:  Lab 07/07/12 0540  WBC 4.4  NEUTROABS --  HGB 9.1*  HCT 29.2*  MCV 93.9  PLT 213   Cardiac Enzymes:  Lab 07/07/12 0602  CKTOTAL --  CKMB --  CKMBINDEX --  TROPONINI <0.30   BNP (last 3 results)  Basename 09/30/11 1419  PROBNP 503.4*   CBG:  Lab 07/07/12 0827  GLUCAP 140*   Studies:  Recent x-ray studies have been reviewed in detail by the Attending Physician  Scheduled Meds:  Reviewed in detail by the Attending Physician   Lonia Blood, MD [facility] Office  [phone] Pager [phone]  On-Call/Text Page:      [email or web address]      password [email or web address]  If 7PM-7AM, please contact night-coverage [email or web address] Password [email or web address] 07/07/2012, 1:52 PM   LOS: 0 days

## 2012-07-07 NOTE — H&P (Signed)
PCP:   Galvin Proffer, MD   Chief Complaint:  Chest pain, shortness of breath  HPI: This is a 56 year old Caucasian male with an extensive cardiac history including multiple myocardial infarctions, multiple stents, systolic heart failure, recent extraction of biventricular ICD secondary to infection on 06/27/2012. He is currently wearing a LifeVest.  He was discharged to a skilled nursing facility. He just recently had his Lasix restarted 3 days ago, but has been having difficulty urinating. He awoke tonight with shortness of breath and substernal chest pain. He was taken to [facility] where he was evaluated.  His first troponin was negative, but he had a probe BNP of 8200 and chest x-ray supporting pulmonary edema. He was given Lasix 60 mg IV, but has not urinated. Due to his request and his cardiologist being and [facility] he was transferred to [facility] for further workup and evaluation. His current chest pain is rated 5/10 and is substernal. It does not radiate. His breathing has eased. Just prior to his arrival, he began to have green bilious emesis. He does feel mildly nauseated. He denies abdominal pain, constipation, diarrhea.  His Lasix was discontinued during his hospitalization for TIA/hemiplegia. He was off it for approximately 2 weeks.  Review of Systems:  A full 10 system review of systems was performed which was as in the history of present illness and otherwise negative  Past Medical History: Past Medical History  Diagnosis Date  . Dyslipidemia     takes Tricor daily  . Stroke   . Asthma   . PONV (postoperative nausea and vomiting)   . Atrial fibrillation     takes Amiodarone daily  . Coronary artery disease   . Peripheral vascular disease   . Myocardial infarction     several;but doesn't recall the last time  . CHF (congestive heart failure)     was on Lasix but has been off for a couple of weeks  . Shortness of breath     with exertion  . Pneumonia       history of   . Peripheral neuropathy   . Arthritis     left foot and left leg  . Gout     takes Colchicine daily  . Scratches     all over legs from dog   . H/O hiatal hernia   . GERD (gastroesophageal reflux disease)     takes Omeprazole daily  . Hepatitis     hx of;6-45yrs ago;doesn't remember which one  . Chronic constipation   . Chronic diarrhea   . Hemorrhoids   . Urinary frequency   . Urinary urgency   . Enlarged prostate     takes Flomax daily  . History of bladder infections   . Chronic kidney disease, stage 3, mod decreased GFR     pt states NO DIALYSIS  . Diabetes mellitus     novolog daily and lantus 20units nightly  . Depression     takes Aricept nightly  . Insomnia   . Hypertension     takes Carvedilol and Imdur daily and Hydralazine daily   Past Surgical History  Procedure Date  . Internal pacemaker     then removedp;2nd placed along with defibrillator   . Toe amputation     2nd and 4th toe on left foot  . Cardiac catheterization 07/05/10/12  . Colonoscopy   . Esophagogastroduodenoscopy   . Pacemaker lead removal 06/26/2012    Procedure: PACEMAKER LEAD REMOVAL;  Surgeon: Doylene Canning  Ladona Ridgel, MD;  Location: [facility] OR;  Service: Cardiovascular;  Laterality: N/A;    Medications: Prior to Admission medications   Medication Sig Start Date End Date Taking? Authorizing Provider  acetaminophen (TYLENOL) 650 MG CR tablet Take 650 mg by mouth every 4 (four) hours as needed. PAIN   Yes Historical Provider, MD  amiodarone (PACERONE) 200 MG tablet Take 200 mg by mouth 2 (two) times daily.     Yes Historical Provider, MD  carvedilol (COREG) 6.25 MG tablet Take 1 tablet (6.25 mg total) by mouth 2 (two) times daily with a meal. 06/28/12  Yes Brooke O Edmisten, PA-C  colchicine 0.6 MG tablet Take 0.6 mg by mouth daily.   Yes Historical Provider, MD  donepezil (ARICEPT) 5 MG tablet Take 5 mg by mouth at bedtime.   Yes Historical Provider, MD  febuxostat (ULORIC) 40 MG tablet  Take 40 mg by mouth daily.   Yes Historical Provider, MD  fenofibrate (TRICOR) 145 MG tablet Take 145 mg by mouth daily.   Yes Historical Provider, MD  furosemide (LASIX) 40 MG tablet Take 40 mg by mouth 2 (two) times daily.   Yes Historical Provider, MD  gabapentin (NEURONTIN) 300 MG capsule Take 300 mg by mouth 3 (three) times daily.     Yes Historical Provider, MD  hydrALAZINE (APRESOLINE) 25 MG tablet Take 25 mg by mouth 3 (three) times daily.     Yes Historical Provider, MD  HYDROcodone-acetaminophen (VICODIN) 5-500 MG per tablet Take 1 tablet by mouth at bedtime. PAIN   Yes Historical Provider, MD  insulin aspart (NOVOLOG) 100 UNIT/ML injection Inject 1-12 Units into the skin 3 (three) times daily before meals. PER SLIDING SCALE   Yes Historical Provider, MD  insulin glargine (LANTUS) 100 UNIT/ML injection Inject 20 Units into the skin at bedtime. 06/20/12 06/20/13 Yes Altha Harm, MD  isosorbide mononitrate (IMDUR) 60 MG 24 hr tablet Take 90 mg by mouth at bedtime.     Yes Historical Provider, MD  magnesium oxide (MAG-OX) 400 MG tablet Take 400 mg by mouth 2 (two) times daily.   Yes Historical Provider, MD  nitroGLYCERIN (NITROSTAT) 0.4 MG SL tablet Place 0.4 mg under the tongue every 5 (five) minutes as needed. For chest pain    Yes Historical Provider, MD  omeprazole (PRILOSEC) 20 MG capsule Take 40 mg by mouth 2 (two) times daily.   Yes Historical Provider, MD  potassium chloride (K-DUR) 10 MEQ tablet Take 10 mEq by mouth daily.   Yes Historical Provider, MD  Tamsulosin HCl (FLOMAX) 0.4 MG CAPS Take 0.4 mg by mouth daily.   Yes Historical Provider, MD  Ticagrelor (BRILINTA) 90 MG TABS tablet Take 90 mg by mouth 2 (two) times daily.    Yes Historical Provider, MD  vitamin B-12 (CYANOCOBALAMIN) 250 MCG tablet Take 250 mcg by mouth daily.   Yes Historical Provider, MD  clindamycin (CLEOCIN) 300 MG capsule Take 1 capsule (300 mg total) by mouth 3 (three) times daily. For 5 days then stop.  06/28/12 07/08/12  Herby Abraham Edmisten, PA-C    Allergies:   Allergies  Allergen Reactions  . Cephalosporins Nausea Only  . Morphine And Related Other (See Comments)    hypotension  . Penicillins Other (See Comments)    Reaction as a child -unknown    Social History:  reports that he has never smoked. He does not have any smokeless tobacco history on file. He reports that he does not drink alcohol or use illicit drugs.  Family History: Family History  Problem Relation Age of Onset  . Hypertension Mother   . Diabetes Mother   . Diabetes Brother   . Hypertension Brother     Physical Exam: There were no vitals filed for this visit. General appearance: alert, cooperative and mild distress Head: Normocephalic, without obvious abnormality, atraumatic Eyes: conjunctivae/corneas clear. PERRL, EOM's intact. Fundi benign. Throat: lips, mucosa, and tongue normal; teeth and gums normal Neck: JVD - 8 cm above sternal notch, no adenopathy, no carotid bruit, supple, symmetrical, trachea midline and thyroid not enlarged, symmetric, no tenderness/mass/nodules Resp: Bibasilar rales Cardio: regular rate and rhythm, S1, S2 normal, no murmur, click, rub or gallop GI: soft, non-tender; bowel sounds normal; no masses,  no organomegaly Male genitalia: Uncircumcised penis. Significant stricture of the foreskin Extremities: edema 3+ edema to the mid thigh Pulses: 2+ and symmetric Skin: Skin color, texture, turgor normal. No rashes or lesions Lymph nodes: Cervical, supraclavicular, and axillary nodes normal. Neurologic: Grossly normal  EKG: Sinus bradycardia at 57. First-degree AV block PR interval of 0.27. No acute ST elevations. No change from 06/27/2012.  Labs on Admission:  No results found for this basename: NA:2,K:2,CL:2,CO2:2,GLUCOSE:2,BUN:2,CREATININE:2,CALCIUM:2,MG:2,PHOS:2 in the last 72 hours No results found for this basename: AST:2,ALT:2,ALKPHOS:2,BILITOT:2,PROT:2,ALBUMIN:2 in the last 72  hours No results found for this basename: LIPASE:2,AMYLASE:2 in the last 72 hours No results found for this basename: WBC:2,NEUTROABS:2,HGB:2,HCT:2,MCV:2,PLT:2 in the last 72 hours No results found for this basename: CKTOTAL:3,CKMB:3,CKMBINDEX:3,TROPONINI:3 in the last 72 hours No results found for this basename: TSH,T4TOTAL,FREET3,T3FREE,THYROIDAB in the last 72 hours No results found for this basename: VITAMINB12:2,FOLATE:2,FERRITIN:2,TIBC:2,IRON:2,RETICCTPCT:2 in the last 72 hours  Radiological Exams on Admission: Dg Chest 2 View  06/27/2012  *RADIOLOGY REPORT*  Clinical Data: Status post removal of AICD due to infection.  Chest soreness.  CHEST - 2 VIEW  Comparison: 06/25/2012 and 02/21/2012.  Findings: Lateral view degraded by patient arm position.  Moderate thoracic spondylosis.  Removal of pacer / AICD device. Midline trachea.  Mild cardiomegaly. Mediastinal contours otherwise within normal limits.  No pleural effusion or pneumothorax. Prominent anterior 1st left rib.  Calcified granuloma projecting over the right upper lobe approximately 7 mm.  There is suggestion of increased density projecting over the left upper lobe laterally. This area was partially obscured by pacer battery on prior exams.  IMPRESSION:  1.  No pneumothorax or other acute finding. 2. Subtle nodularity projecting over the left upper lobe.  If there are infectious symptoms, this could be reevaluated on follow-up after antibiotics.  If not, further evaluation with follow-up plain films at approximately 3 months versus further characterization with contrast enhanced chest CT should be considered.  These results will be called to the ordering clinician or representative by the Radiologist Assistant, and communication documented in the PACS Dashboard.  Original Report Authenticated By: Consuello Bossier, M.D.   Dg Chest 2 View  06/25/2012  *RADIOLOGY REPORT*  Clinical Data: Pacemaker lead removal.  Short of breath. Hypertension.   Diabetic.  CHF.  CHEST - 2 VIEW  Comparison: 02/21/2012  Findings: Moderate thoracic spondylosis.  Pacers and AICD devices. This terminate at the right atrium, right ventricle, and coronary sinus.  Midline trachea.  Mild cardiomegaly. No pleural effusion or pneumothorax.  No congestive failure.  Clear lungs.  IMPRESSION: Cardiomegaly, without acute disease.  Original Report Authenticated By: Consuello Bossier, M.D.   Ct Head Wo Contrast  06/19/2012  *RADIOLOGY REPORT*  Clinical Data: Follow-up stroke.  Leg weakness.  CT HEAD WITHOUT CONTRAST  Technique:  Contiguous axial images were obtained from the base of the skull through the vertex without contrast.  Comparison: None.  Findings: No definite acute cortical infarct, intracranial hemorrhage, mass lesion, hydrocephalus, or extra-axial fluid.  Mild atrophy with chronic microvascular ischemic change.  Old right cerebellar infarct.  Calvarium intact.  Clear sinuses and mastoids. Proximal vascular calcification.  No change from 07/22.  IMPRESSION: Chronic changes as described.  No acute cortical infarct or bleed clearly involving the left hemisphere motor strip subserving the left leg.  Consider repeat CT scanning.  The patient is not a candidate for MR due to pacemaker.  Original Report Authenticated By: Elsie Stain, M.D.   Ct Head Wo Contrast  06/17/2012  *RADIOLOGY REPORT*  Clinical Data: Code stroke  CT HEAD WITHOUT CONTRAST  Technique:  Contiguous axial images were obtained from the base of the skull through the vertex without contrast.  Comparison: 11/06/2010  Findings: There is no evidence for acute hemorrhage, hydrocephalus, mass lesion, or abnormal extra-axial fluid collection.  No definite CT evidence for acute infarction.  Patchy low attenuation in the deep hemispheric and periventricular white matter is nonspecific, but likely reflects chronic microvascular ischemic demyelination. Right inferior cerebellar encephalomalacia is stable.  The visualized  paranasal sinuses and mastoid air cells are clear.  IMPRESSION: Stable.  No acute intracranial abnormality.  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  I discussed these findings by telephone with Dr. Thad Ranger at approximately 1140 hours on 06/17/2012.  Original Report Authenticated By: ERIC A. MANSELL, M.D.    Assessment/Plan Present on Admission:  .Vomiting .Acute congestive heart failure .Urethral obstruction .Chest pain  #1 acute congestive heart failure: Admit the patient to step down unit. Continue telemetry monitoring. Continue Lasix and potassium. Continue beta blocker, aspirin, Imdur. No ACE inhibitor ordered as the patient has renal insufficiency. Consult cardiology in the morning for echocardiogram.  #2 chest pain. This is unlikely to the results of myocardial infarction. Normal troponin at [facility]. [facility] obtain stat troponin now and every 6 hours. EKG is unchanged from 06/27/2012. Continued light dose of Dilaudid (patient allergic to morphine).  #3 urethral obstruction. Foley catheter placed with drainage of approximately 700 mL of urine. The patient may warrant urology consult if this continues to be a problem  #4 vomiting. The patient has no other symptoms of gastroenteritis. This is likely due to medication reaction. Will continue antiemetics and support symptomatically.  #5 diabetes. As patient currently having emesis, will decrease Lantus by half of normal dose and cover the patient with sliding scale.  #6 hypertension. Continue beta blocker.  #7 ventricular tachyarrhythmia. Continue amiodarone  #8 chronic kidney disease.  Continue to monitor creatinine. Creatinine at baseline of 2.10.  #9 DVT prophylaxis: Lovenox and SCDs  #10 CODE STATUS: Full code. . Greater than 70 minutes was spent an admission of this patient.  Caleen Taaffe JEHIEL 07/07/2012, 5:10 AM `

## 2012-07-08 ENCOUNTER — Inpatient Hospital Stay (HOSPITAL_COMMUNITY): Payer: Medicare Other

## 2012-07-08 DIAGNOSIS — G8190 Hemiplegia, unspecified affecting unspecified side: Secondary | ICD-10-CM

## 2012-07-08 LAB — CARDIAC PANEL(CRET KIN+CKTOT+MB+TROPI)
CK, MB: 2.3 ng/mL (ref 0.3–4.0)
CK, MB: 2.3 ng/mL (ref 0.3–4.0)
Relative Index: 1.1 (ref 0.0–2.5)
Relative Index: 1.1 (ref 0.0–2.5)
Total CK: 208 U/L (ref 7–232)
Troponin I: <0.3 ng/mL (ref <0.30)
Troponin I: <0.3 ng/mL

## 2012-07-08 LAB — CBC
HCT: 29.5 % — ABNORMAL LOW (ref 39.0–52.0)
Hemoglobin: 9.4 g/dL — ABNORMAL LOW (ref 13.0–17.0)
MCH: 30.3 pg (ref 26.0–34.0)
MCHC: 31.9 g/dL (ref 30.0–36.0)
RDW: 18.5 % — ABNORMAL HIGH (ref 11.5–15.5)

## 2012-07-08 LAB — GLUCOSE, CAPILLARY
Glucose-Capillary: 124 mg/dL — ABNORMAL HIGH (ref 70–99)
Glucose-Capillary: 145 mg/dL — ABNORMAL HIGH (ref 70–99)
Glucose-Capillary: 141 mg/dL — ABNORMAL HIGH (ref 70–99)

## 2012-07-08 LAB — BASIC METABOLIC PANEL
BUN: 44 mg/dL — ABNORMAL HIGH (ref 6–23)
Creatinine, Ser: 2.46 mg/dL — ABNORMAL HIGH (ref 0.50–1.35)
GFR calc non Af Amer: 27 mL/min — ABNORMAL LOW (ref >90)
Glucose, Bld: 148 mg/dL — ABNORMAL HIGH (ref 70–99)
Potassium: 4.3 mEq/L (ref 3.5–5.1)

## 2012-07-08 MED ORDER — FUROSEMIDE 40 MG PO TABS
60.0000 mg | ORAL_TABLET | Freq: Two times a day (BID) | ORAL | Status: DC
  Administered 2012-07-09: 60 mg via ORAL
  Filled 2012-07-08 (×3): qty 1

## 2012-07-08 MED ORDER — DOCUSATE SODIUM 100 MG PO CAPS
100.0000 mg | ORAL_CAPSULE | Freq: Two times a day (BID) | ORAL | Status: DC
  Administered 2012-07-08 – 2012-07-09 (×3): 100 mg via ORAL
  Filled 2012-07-08 (×5): qty 1

## 2012-07-08 MED ORDER — FUROSEMIDE 40 MG PO TABS
60.0000 mg | ORAL_TABLET | Freq: Two times a day (BID) | ORAL | Status: DC
  Filled 2012-07-08: qty 1

## 2012-07-08 NOTE — Care Management Note (Signed)
    Page 1 of 1   07/08/2012     2:03:31 PM   CARE MANAGEMENT NOTE 07/08/2012  Patient:  [name], [name]   Account Number:  [id number]  Date Initiated:  07/08/2012  Documentation initiated by:  Donn Pierini  Subjective/Objective Assessment:   Pt admitted with CHF     Action/Plan:   PTA pt was at [facility]   Anticipated DC Date:  07/11/2012   Anticipated DC Plan:  SKILLED NURSING FACILITY  In-house referral  Clinical Social Worker         Choice offered to / List presented to:             Status of service:  In process, will continue to follow Medicare Important Message given?   (If response is "NO", the following Medicare IM given date fields will be blank) Date Medicare IM given:   Date Additional Medicare IM given:    Discharge Disposition:    Per UR Regulation:  Reviewed for med. necessity/level of care/duration of stay  If discussed at Long Length of Stay Meetings, dates discussed:    Comments:  07/08/12- 1400- Donn Pierini RN, BSN [phone] Pt from a  SNF on admission -anticipate return to SNF at discharge- CSW following for placement needs.

## 2012-07-08 NOTE — Progress Notes (Signed)
Utilization review completed.  

## 2012-07-08 NOTE — Progress Notes (Signed)
Echocardiogram 2D Echocardiogram with Definity has been performed.  [name] 07/08/2012, 4:07 PM

## 2012-07-08 NOTE — Progress Notes (Addendum)
PATIENT'S BP= 91/57 SPOKE TO DR JOSEPH PO HYDRALAZINE HELD, NO OTHER ORDERS RECIEVED DR. Jomarie Longs TO ORDER CONTRAST FOR 2D ECHO

## 2012-07-08 NOTE — Progress Notes (Signed)
[facility] Progress Note   [name]:[id number] DOB: 12-03-1955 DOA: 07/07/2012 PCP: Galvin Proffer, MD  Brief narrative: 56 year old male with an extensive cardiac history including multiple myocardial infarctions, multiple stents, systolic heart failure, recent extraction of biventricular ICD secondary to infection on 06/27/2012. He is currently wearing a LifeVest. He was discharged to a skilled nursing facility. He just recently had his Lasix restarted 3 days ago, but has been having difficulty urinating. He awoke tonight with shortness of breath and substernal chest pain. He was taken to [facility] where he was evaluated. His first troponin was negative, but he had a pro-BNP of 8200 and chest x-ray supporting pulmonary edema. He was given Lasix 60 mg IV, but had not urinated. Due to his request and his cardiologist being in [place] he was transferred to [facility] for further workup and evaluation. Just prior to his arrival, he began to have green bilious emesis.   Assessment/Plan: Acute exacerbation of Chronic systolic Congestive heart failure / ischemic cardiomyopathy Continue lasix IV today, coreg, no ACE/ARB due to renal failure ECho today, Prior echo in our system from 2011 with EF of 15% I/Os, net 2.5L negative so far Transition to Po lasix from 8/13  Chest pain with complex history of coronary artery disease Pain has entirely resolved yesterday - suspect the sensation the patient's chest was more related to volume overload/pulmonary edema - we will complete a cycling of his cardiac enzymes ECHO pending Continue ASA/Brilinta/Coreg/fenofibrate  Urinary retention Likely related to BPH - keep the Foley while lasix IV, could Dc foley when switch to Po lasix in am, flomax added yesterday  Vomiting Resolved  Peripheral vascular disease  History of V. fib arrest Status post AICD implantation 2006 with removal 06/20/2012 due to concern for pocket  infection - is currently wearing a LifeVest, to have another ICD placed upon FU with cards   Diabetes mellitus: stable, SSI  Hypertension: stable  Chronic kidney disease stage III: creatinine up slightly, continue diuresis though  Hyperlipidemia  History of stroke: continue ASA  Code Status: Full Disposition Plan: back to SNF in 24H if stable  Consultants: None  Antibiotics: None  DVT prophylaxis: Lovenox  HPI/Subjective: Breathing better, reports urinating a lot   Objective: Blood pressure 106/64, pulse 65, temperature 97.6 F (36.4 C), temperature source Oral, resp. rate 18, height 5\' 9"  (1.753 m), weight 98.158 kg (216 lb 6.4 oz), SpO2 93.00%.  Intake/Output Summary (Last 24 hours) at 07/08/12 0956 Last data filed at 07/07/12 1948  Gross per 24 hour  Intake    240 ml  Output   1750 ml  Net  -1510 ml   General appearance: alert, cooperative and mild distress  Head: Normocephalic, without obvious abnormality, atraumatic  Eyes: conjunctivae/corneas clear. PERRL, EOM's intact. Fundi benign.  Throat: lips, mucosa, and tongue normal; teeth and gums normal  Neck: JVD -6 cm above sternal notch, no adenopathy, no carotid bruit, supple, symmetrical, trachea midline and thyroid not enlarged, symmetric, no tenderness/mass/nodules  Resp: decreased BS at bases Cardio: regular rate and rhythm, S1, S2 normal, no murmur, click, rub or gallop  GI: soft, non-tender; bowel sounds normal; no masses, no organomegaly  Extremities: edema 1+ edema  Pulses: 2+ and symmetric  Skin: Skin color, texture, turgor normal. No rashes or lesions  Lymph nodes: Cervical, supraclavicular, and axillary nodes normal. Neuro: R LE 4/5 rest 5/5   Exam: Patient is seen for a followup exam  Data Reviewed: Basic Metabolic Panel:  Lab 07/08/12 0620 07/07/12 0540  NA 137 138  K 4.3 3.6  CL 98 98  CO2 33* 32  GLUCOSE 148* 156*  BUN 44* 40*  CREATININE 2.46* 2.22*  CALCIUM 9.0 8.9  MG -- --    PHOS -- --   CBC:  Lab 07/08/12 0620 07/07/12 0540  WBC PENDING 4.4  NEUTROABS -- --  HGB 9.4* 9.1*  HCT 29.5* 29.2*  MCV 95.2 93.9  PLT 223 213   Cardiac Enzymes:  Lab 07/07/12 1653 07/07/12 0602  CKTOTAL -- --  CKMB -- --  CKMBINDEX -- --  TROPONINI <0.30 <0.30   BNP (last 3 results)  Basename 09/30/11 1419  PROBNP 503.4*   CBG:  Lab 07/08/12 0736 07/07/12 2129 07/07/12 1828 07/07/12 1626 07/07/12 1231  GLUCAP 124* 118* 141* 157* 181*      Zannie Cove, MD [facility] Office  [phone] Pager [phone]  On-Call/Text Page:      [email or web address]      password [email or web address]  If 7PM-7AM, please contact night-coverage [email or web address] Password [facility] 07/08/2012, 9:56 AM   LOS: 1 day

## 2012-07-09 DIAGNOSIS — N139 Obstructive and reflux uropathy, unspecified: Secondary | ICD-10-CM

## 2012-07-09 LAB — BASIC METABOLIC PANEL
BUN: 47 mg/dL — ABNORMAL HIGH (ref 6–23)
GFR calc non Af Amer: 28 mL/min — ABNORMAL LOW (ref >90)
Glucose, Bld: 230 mg/dL — ABNORMAL HIGH (ref 70–99)
Potassium: 4.2 mEq/L (ref 3.5–5.1)

## 2012-07-09 LAB — GLUCOSE, CAPILLARY: Glucose-Capillary: 249 mg/dL — ABNORMAL HIGH (ref 70–99)

## 2012-07-09 MED ORDER — DSS 100 MG PO CAPS: 100.0000 mg | ORAL_CAPSULE | Freq: Two times a day (BID) | ORAL | Status: AC

## 2012-07-09 MED ORDER — PHENAZOPYRIDINE HCL 100 MG PO TABS
100.0000 mg | ORAL_TABLET | Freq: Once | ORAL | Status: AC
  Administered 2012-07-09: 100 mg via ORAL
  Filled 2012-07-09: qty 1

## 2012-07-09 MED ORDER — PHENAZOPYRIDINE HCL 100 MG PO TABS
100.0000 mg | ORAL_TABLET | Freq: Three times a day (TID) | ORAL | Status: DC
  Administered 2012-07-09: 100 mg via ORAL
  Filled 2012-07-09 (×5): qty 1

## 2012-07-09 MED ORDER — FUROSEMIDE 20 MG PO TABS: 60.0000 mg | ORAL_TABLET | Freq: Two times a day (BID) | ORAL | Status: DC

## 2012-07-09 MED ORDER — PHENAZOPYRIDINE HCL 200 MG PO TABS: 200.0000 mg | ORAL_TABLET | Freq: Three times a day (TID) | ORAL | Status: DC

## 2012-07-09 NOTE — Clinical Social Work Psychosocial (Signed)
     Clinical Social Work Department BRIEF PSYCHOSOCIAL ASSESSMENT 07/09/2012  Patient:  [name], [name]     Account Number:  [id number]     Admit date:  07/07/2012  Clinical Social Worker:  Doree Albee  Date/Time:  07/09/2012 09:58 AM  Referred by:  RN  Date Referred:  07/08/2012 Referred for  SNF Placement   Other Referral:   Interview type:  Patient Other interview type:    PSYCHOSOCIAL DATA Living Status:  FACILITY Admitted from facility:  [facility] Level of care:  Skilled Nursing Facility Primary support name:  Jose Corvin Primary support relationship to patient:  SIBLING Degree of support available:   strong    CURRENT CONCERNS Current Concerns  Post-Acute Placement   Other Concerns:    SOCIAL WORK ASSESSMENT / PLAN CSW met with pt at bedside to discuss pt current living environment and pt discdharge plans. Pt stated that he has been at [facility] for short term rehab.    Pt is very concerned regarding being able to return to snf for rehab due to having to pay a bed hold. Pt states, 'I can't afford a bed hold, and [facility] is the one of the few places i can have my life vest.".    CSW spoke with facility admissions regarding pt potential discharge for today or tomorrow. Per dicussion with carter, snf liaision the facility should be able to work with patient in order to avoid bed hold cost.    CSW will complete fl2 for md signature.   Assessment/plan status:  Psychosocial Support/Ongoing Assessment of Needs Other assessment/ plan:   Information/referral to community resources:   no resources identified at this time, pt well connected with rehab facility.    PATIENTS/FAMILYS RESPONSE TO PLAN OF CARE: Patient thanked csw for concern and support. Pt is motivated to return to facility when medically stable.

## 2012-07-09 NOTE — Progress Notes (Signed)
Pt voided 700cc , voiced no complaints

## 2012-07-09 NOTE — Progress Notes (Signed)
Pt was d/ced to [facility] by ambulance

## 2012-07-09 NOTE — Progress Notes (Signed)
CSW spoke with acute rehab regarding need for pt evaluation for insurance prior authorization. CSW will need to send clinicals to facility as soon as possible in order to expedite discharge process to snf.   Marland KitchenCatha Gosselin, Theresia Majors  [phone] .07/09/2012 10:16am

## 2012-07-09 NOTE — Progress Notes (Signed)
Unable to pass the  I/o  Catheter and pt wants his urologist callled will let Dr. Betti Cruz Know

## 2012-07-09 NOTE — Evaluation (Signed)
Physical Therapy Evaluation Patient Details Name: [name] MRN: [id number] DOB: [birth year] Today's Date: 07/09/2012 Time: 4098-1191 PT Time Calculation (min): 21 min  PT Assessment / Plan / Recommendation Clinical Impression  Pt admitted with pulmonary edema after recent ICD removal due to infection. Pt with continued weakness and decreased mobility from baseline and from prior admission. Pt will benefit from acute therapy to maximize strength, transfers, mobility and gait prior to return to SNF for further rehab.     PT Assessment  Patient needs continued PT services    Follow Up Recommendations  Skilled nursing facility    Barriers to Discharge Decreased caregiver support      Equipment Recommendations  Defer to next venue    Recommendations for Other Services     Frequency Min 3X/week    Precautions / Restrictions Precautions Precautions: Fall Precaution Comments: life vest   Pertinent Vitals/Pain No pain, fatigue and weakness with ambulation and bil LE exercises      Mobility  Bed Mobility Bed Mobility: Not assessed (pt denied attempting due to preference for chair) Transfers Transfers: Sit to Stand;Stand to Sit Sit to Stand: 4: Min assist;From chair/3-in-1;With armrests Stand to Sit: 4: Min guard;To chair/3-in-1;With armrests Details for Transfer Assistance: cueing for hand placement and safety with assist for anterior translation Ambulation/Gait Ambulation/Gait Assistance: 4: Min assist Ambulation Distance (Feet): 6 Feet (5' then 6') Assistive device: Rolling walker Ambulation/Gait Assistance Details: short shuffling steps with cueing for posture and position in RW with pt refusing to attempt further distance Gait Pattern: Step-to pattern;Decreased hip/knee flexion - right;Decreased hip/knee flexion - left;Wide base of support;Decreased step length - right;Decreased step length - left Gait velocity: significantly decreased Stairs: No    Exercises General  Exercises - Lower Extremity Long Arc Quad: AROM;Right;Left;5 reps;10 reps;Seated (5 reps on RLE, 10 reps on LLE) Hip Flexion/Marching: AROM;Both;5 reps;Seated   PT Diagnosis: Difficulty walking  PT Problem List: Decreased strength;Decreased activity tolerance;Decreased mobility;Decreased knowledge of use of DME PT Treatment Interventions: DME instruction;Gait training;Functional mobility training;Therapeutic activities;Therapeutic exercise;Patient/family education   PT Goals Acute Rehab PT Goals PT Goal Formulation: With patient Time For Goal Achievement: 07/16/12 Potential to Achieve Goals: Good Pt will go Supine/Side to Sit: with supervision;with HOB 0 degrees PT Goal: Supine/Side to Sit - Progress: Goal set today Pt will go Sit to Supine/Side: with supervision PT Goal: Sit to Supine/Side - Progress: Goal set today Pt will go Sit to Stand: with supervision PT Goal: Sit to Stand - Progress: Goal set today Pt will Ambulate: 16 - 50 feet;with supervision;with rolling walker PT Goal: Ambulate - Progress: Goal set today PT Goal: Up/Down Stairs - Progress: Discontinued (comment) (from prior admission)  Visit Information  Last PT Received On: 07/09/12 Assistance Needed: +1    Subjective Data  Subjective: I know I have to get stronger still Patient Stated Goal: return to [facility]   Prior Functioning  Home Living Available Help at Discharge: Skilled Nursing Facility Home Layout: One level Bathroom Shower/Tub: Health visitor: Standard Home Adaptive Equipment: Environmental consultant - rolling;Straight cane Additional Comments: Prior to recent hospitalizations pt was living with brother in a trailer with 8steps to enter until 3wks ago. At that time pt was ambulating with a cane and sponge bathing but that brother works at [employer] and is not available to help. Since hospitalizations over the last few weeks pt has been unable to ambulate and has required assist with all transfers.  Prior  Function Level of Independence: Needs assistance Needs  Assistance: Transfers;Gait;Bathing;Meal Prep;Light Housekeeping Bath: Supervision/set-up Meal Prep: Maximal Light Housekeeping: Maximal Gait Assistance: unable greater than 2 feet in parallel bars at SNF Transfer Assistance: min assist to pivot and stand, mod assist for bed mobility Able to Take Stairs?: No Driving: No Vocation: On disability Communication Communication: No difficulties    Cognition  Overall Cognitive Status: Appears within functional limits for tasks assessed/performed Arousal/Alertness: Awake/alert Orientation Level: Appears intact for tasks assessed Behavior During Session: [facility] for tasks performed    Extremity/Trunk Assessment Right Lower Extremity Assessment RLE ROM/Strength/Tone: Deficits RLE ROM/Strength/Tone Deficits: 3/5 knee extension and flexion and 3+/5 hip flexion with limited effort to resist with any movement. 3/5 hip Abdct/ADD RLE Sensation: History of peripheral neuropathy Left Lower Extremity Assessment LLE ROM/Strength/Tone: Deficits LLE ROM/Strength/Tone Deficits: 3/5 knee extension and flexion and 3+/5 hip flexion with limited effort to resist with any movement. 3/5 hip Abdct/ADD LLE Sensation: History of peripheral neuropathy   Balance Static Standing Balance Static Standing - Balance Support: Bilateral upper extremity supported Static Standing - Level of Assistance: 5: Stand by assistance Static Standing - Comment/# of Minutes: 2  End of Session PT - End of Session Equipment Utilized During Treatment: Gait belt Activity Tolerance: Patient limited by fatigue Patient left: in chair;with call bell/phone within reach  GP     Delorse Lek 07/09/2012, 1:57 PM  Delaney Meigs, PT [phone]

## 2012-07-09 NOTE — Progress Notes (Signed)
Pt still complaining of 10/10 pain at foley catheter site despite administration of norco and pyridium.  MD notified.  Received order to d/c foley catheter.  Pt rates pain at a 2/10 after catheter removal.  Will continue to monitor.

## 2012-07-09 NOTE — Progress Notes (Signed)
Inpatient Diabetes Program Recommendations  AACE/ADA: New Consensus Statement on Inpatient Glycemic Control (2013)  Target Ranges:  Prepandial:   less than 140 mg/dL      Peak postprandial:   less than 180 mg/dL (1-2 hours)      Critically ill patients:  140 - 180 mg/dL    Results for MASATO, PETTIE (MRN [id number]) as of 07/09/2012 12:01  Ref. Range 07/09/2012 07:51 07/09/2012 11:34  Glucose-Capillary Latest Range: 70-99 mg/dL 409 (H) 811 (H)     Inpatient Diabetes Program Recommendations Insulin - Basal: Please consider increasing patient's Lantus dose to 20 units QHS (home dose) now that her morning CBGs are >200 mg/dl  Note: Will follow. Ambrose Finland RN, MSN, CDE Diabetes Coordinator Inpatient Diabetes Program [phone]

## 2012-07-09 NOTE — Discharge Summary (Signed)
Physician Discharge Summary  TRISTON SKARE HQI:[id number] DOB: [birth year] DOA: 07/07/2012  PCP: Galvin Proffer, MD  Admit date: 07/07/2012 Discharge date: 07/09/2012  Recommendations for Outpatient Follow-up:  1. Followup with HAGUE, Myrene Galas, MD (PCP) in 1 week. 2. Followup with Cardiology in few weeks for pacemaker placement. 3. Needs CBC and BMET in 1 week, PCP to followup in the labs.  Discharge Diagnoses:  Principal Problem:  *Acute congestive heart failure Active Problems:  Dyslipidemia  Hypertension  Chronic kidney disease, stage 3-4, mod decreased GFR  Vomiting  Urethral obstruction  Chest pain  Discharge Condition: Stable  Diet recommendation:  Diabetic diet.  Filed Weights   07/07/12 0415 07/08/12 0500 07/09/12 0654  Weight: 101.9 kg (224 lb 10.4 oz) 98.158 kg (216 lb 6.4 oz) 93.622 kg (206 lb 6.4 oz)    History of present illness:  55 year old male with an extensive cardiac history including multiple myocardial infarctions, multiple stents, systolic heart failure, recent extraction of biventricular ICD secondary to infection on 06/27/2012. He is currently wearing a LifeVest. He was discharged to a skilled nursing facility. He just recently had his Lasix restarted 3 days ago, but has been having difficulty urinating. He awoke tonight with shortness of breath and substernal chest pain. He was taken to [facility] where he was evaluated. His first troponin was negative, but he had a pro-BNP of 8200 and chest x-ray supporting pulmonary edema. He was given Lasix 60 mg IV, but had not urinated. Due to his request and his cardiologist being in [place] he was transferred to [facility] for further workup and evaluation. Just prior to his arrival, he began to have green bilious emesis.    Hospital Course:  Acute exacerbation of Chronic systolic Congestive heart failure / ischemic cardiomyopathy  Lasix transitioned to IV lasix to PO lasix. Continue coreg, no  ACE/ARB due to renal failure. 2-D echocardiogram on 07/08/2012, cavity size was mildly dilated, systolic function was moderately severely reduced, ejection fraction 30-35%, dyskinesis of mid distalanteroseptal, anterior and apical myocardium. Findings are consistent with pseudo-normal left ventricular filling pattern, concomitant abnormal relaxation and increased filling pressure (grade 2 diastolic dysfunction), septal motion showed abnormal function, and dyssynergy, and paradox, left atrium was severely dilated, right ventricle cavity size was moderately dilated, right atrium dilated, mild tricuspid valve regurgitation, pulmonary artery pressure 70 mmHg. Prior echo in our system from 2011 with EF of 15%. I/Os, net 5.4L negative so far.   Chest pain with complex history of coronary artery disease  Pain has entirely resolved yesterday, suspect the sensation the patient's chest was more related to volume overload/pulmonary edema. Cardiac enzymes were cycled and was negative. Continue ASA/Brilinta/Coreg/fenofibrate.   Urinary retention  Likely related to BPH. Foley catheter was discontinued during the night of 07/09/2012, had difficulty urinating, initially attempted I/O cath, but was unsuccessful. Urology was consulted, Dr. Patsi Sears, but patient was able to void 700 ccs prior to discharge. Continue Flomax. May need outpatient urology followup with Dr. Isabel Caprice.    Vomiting  Resolved.  Peripheral vascular disease  Stable.  History of V. fib arrest  Status post AICD implantation 2006 with removal 06/20/2012 due to concern for pocket infection is currently wearing a LifeVest, to have another ICD placed upon FU with cards.   ICD infection, chronic.  Was evaluated by cardiology, was initially on Linezolid.   Diabetes mellitus  Stable, SSI.   Hypertension  Stable.  Chronic kidney disease stage III  Creatinine up slightly from baseline, continue diuresis  though.   Hyperlipidemia  Stable.    History of stroke  Continue ASA. Stable.  Procedures:  None.  Consultations:  Urology  Discharge Exam: Filed Vitals:   07/09/12 0605  BP: 110/60  Pulse:   Temp:   Resp:    Filed Vitals:   07/09/12 0230 07/09/12 0500 07/09/12 0605 07/09/12 0654  BP: 108/62 100/61 110/60   Pulse:  63    Temp:  97.9 F (36.6 C)    TempSrc:  Oral    Resp:      Height:      Weight:    93.622 kg (206 lb 6.4 oz)  SpO2:  96%      Discharge Instructions  Discharge Orders    Future Appointments: Provider: Department: Dept Phone: Center:   07/17/2012 10:30 AM [facility] [place] [phone] [facility]   07/22/2012 12:00 PM Marinus Maw, MD [facility] [phone] [facility]     Future Orders Please Complete By Expires   Diet - low sodium heart healthy      Increase activity slowly      Discharge instructions      Comments:   Followup with HAGUE, Myrene Galas, MD (PCP) in 1 week. Need CBC and BMET in 1 week. Followup with cardiology as outpatient in ~4 weeks.     Medication List  As of 07/09/2012  2:13 PM   STOP taking these medications         acetaminophen 325 MG tablet      clindamycin 300 MG capsule         TAKE these medications         acetaminophen 650 MG CR tablet   Commonly known as: TYLENOL   Take 650 mg by mouth every 4 (four) hours as needed. PAIN      amiodarone 200 MG tablet   Commonly known as: PACERONE   Take 200 mg by mouth 2 (two) times daily.      carvedilol 6.25 MG tablet   Commonly known as: COREG   Take 1 tablet (6.25 mg total) by mouth 2 (two) times daily with a meal.      colchicine 0.6 MG tablet   Take 0.6 mg by mouth daily.      donepezil 5 MG tablet   Commonly known as: ARICEPT   Take 5 mg by mouth at bedtime.      DSS 100 MG Caps   Take 100 mg by mouth 2 (two) times daily.      febuxostat 40 MG tablet   Commonly known as: ULORIC   Take 40 mg by mouth daily.      fenofibrate 145 MG tablet    Commonly known as: TRICOR   Take 145 mg by mouth daily.      furosemide 20 MG tablet   Commonly known as: LASIX   Take 3 tablets (60 mg total) by mouth 2 (two) times daily.      gabapentin 300 MG capsule   Commonly known as: NEURONTIN   Take 300 mg by mouth 3 (three) times daily.      hydrALAZINE 25 MG tablet   Commonly known as: APRESOLINE   Take 25 mg by mouth 3 (three) times daily.      HYDROcodone-acetaminophen 5-500 MG per tablet   Commonly known as: VICODIN   Take 1 tablet by mouth at bedtime. PAIN      insulin aspart 100 UNIT/ML injection   Commonly known as: novoLOG  Inject 1-12 Units into the skin 3 (three) times daily before meals. PER SLIDING SCALE      insulin glargine 100 UNIT/ML injection   Commonly known as: LANTUS   Inject 20 Units into the skin at bedtime.      isosorbide mononitrate 60 MG 24 hr tablet   Commonly known as: IMDUR   Take 90 mg by mouth at bedtime.      magnesium oxide 400 MG tablet   Commonly known as: MAG-OX   Take 400 mg by mouth 2 (two) times daily.      nitroGLYCERIN 0.4 MG SL tablet   Commonly known as: NITROSTAT   Place 0.4 mg under the tongue every 5 (five) minutes as needed. For chest pain      omeprazole 20 MG capsule   Commonly known as: PRILOSEC   Take 40 mg by mouth 2 (two) times daily.      potassium chloride 10 MEQ tablet   Commonly known as: K-DUR   Take 10 mEq by mouth daily.      Tamsulosin HCl 0.4 MG Caps   Commonly known as: FLOMAX   Take 0.4 mg by mouth daily.      Ticagrelor 90 MG Tabs tablet   Commonly known as: BRILINTA   Take 90 mg by mouth 2 (two) times daily.      vitamin B-12 250 MCG tablet   Commonly known as: CYANOCOBALAMIN   Take 250 mcg by mouth daily.              The results of significant diagnostics from this hospitalization (including imaging, microbiology, ancillary and laboratory) are listed below for reference.    Significant Diagnostic Studies: Dg Chest 2 View  06/27/2012   *RADIOLOGY REPORT*  Clinical Data: Status post removal of AICD due to infection.  Chest soreness.  CHEST - 2 VIEW  Comparison: 06/25/2012 and 02/21/2012.  Findings: Lateral view degraded by patient arm position.  Moderate thoracic spondylosis.  Removal of pacer / AICD device. Midline trachea.  Mild cardiomegaly. Mediastinal contours otherwise within normal limits.  No pleural effusion or pneumothorax. Prominent anterior 1st left rib.  Calcified granuloma projecting over the right upper lobe approximately 7 mm.  There is suggestion of increased density projecting over the left upper lobe laterally. This area was partially obscured by pacer battery on prior exams.  IMPRESSION:  1.  No pneumothorax or other acute finding. 2. Subtle nodularity projecting over the left upper lobe.  If there are infectious symptoms, this could be reevaluated on follow-up after antibiotics.  If not, further evaluation with follow-up plain films at approximately 3 months versus further characterization with contrast enhanced chest CT should be considered.  These results will be called to the ordering clinician or representative by the Radiologist Assistant, and communication documented in the PACS Dashboard.  Original Report Authenticated By: Consuello Bossier, M.D.   Dg Chest 2 View  06/25/2012  *RADIOLOGY REPORT*  Clinical Data: Pacemaker lead removal.  Short of breath. Hypertension.  Diabetic.  CHF.  CHEST - 2 VIEW  Comparison: 02/21/2012  Findings: Moderate thoracic spondylosis.  Pacers and AICD devices. This terminate at the right atrium, right ventricle, and coronary sinus.  Midline trachea.  Mild cardiomegaly. No pleural effusion or pneumothorax.  No congestive failure.  Clear lungs.  IMPRESSION: Cardiomegaly, without acute disease.  Original Report Authenticated By: Consuello Bossier, M.D.   Ct Head Wo Contrast  06/19/2012  *RADIOLOGY REPORT*  Clinical Data: Follow-up stroke.  Leg  weakness.  CT HEAD WITHOUT CONTRAST  Technique:   Contiguous axial images were obtained from the base of the skull through the vertex without contrast.  Comparison: None.  Findings: No definite acute cortical infarct, intracranial hemorrhage, mass lesion, hydrocephalus, or extra-axial fluid.  Mild atrophy with chronic microvascular ischemic change.  Old right cerebellar infarct.  Calvarium intact.  Clear sinuses and mastoids. Proximal vascular calcification.  No change from 07/22.  IMPRESSION: Chronic changes as described.  No acute cortical infarct or bleed clearly involving the left hemisphere motor strip subserving the left leg.  Consider repeat CT scanning.  The patient is not a candidate for MR due to pacemaker.  Original Report Authenticated By: Elsie Stain, M.D.   Ct Head Wo Contrast  06/17/2012  *RADIOLOGY REPORT*  Clinical Data: Code stroke  CT HEAD WITHOUT CONTRAST  Technique:  Contiguous axial images were obtained from the base of the skull through the vertex without contrast.  Comparison: 11/06/2010  Findings: There is no evidence for acute hemorrhage, hydrocephalus, mass lesion, or abnormal extra-axial fluid collection.  No definite CT evidence for acute infarction.  Patchy low attenuation in the deep hemispheric and periventricular white matter is nonspecific, but likely reflects chronic microvascular ischemic demyelination. Right inferior cerebellar encephalomalacia is stable.  The visualized paranasal sinuses and mastoid air cells are clear.  IMPRESSION: Stable.  No acute intracranial abnormality.  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  I discussed these findings by telephone with Dr. Thad Ranger at approximately 1140 hours on 06/17/2012.  Original Report Authenticated By: ERIC A. MANSELL, M.D.   Dg Chest Port 1 View  07/08/2012  *RADIOLOGY REPORT*  Clinical Data: Fluid retention, myocardial infarction, pacemaker removal, CHF  PORTABLE CHEST - 1 VIEW  Comparison: 07/07/2012; 06/27/2012;  06/25/2012  Findings: Grossly unchanged enlarged cardiac silhouette and mediastinal contours.  External pacemaker devices again overlying the thorax.  Mild pulmonary venous congestion without frank evidence of pulmonary edema.  No definite pleural effusion or pneumothorax.  Unchanged bones.  IMPRESSION: Cardiomegaly and mild pulmonary venous congestion.  Original Report Authenticated By: Waynard Reeds, M.D.    Microbiology: No results found for this or any previous visit (from the past 240 hour(s)).   Labs: Basic Metabolic Panel:  Lab 07/09/12 2841 07/08/12 0620 07/07/12 0540  NA 135 137 138  K 4.2 4.3 3.6  CL 95* 98 98  CO2 30 33* 32  GLUCOSE 230* 148* 156*  BUN 47* 44* 40*  CREATININE 2.41* 2.46* 2.22*  CALCIUM 8.7 9.0 8.9  MG -- -- --  PHOS -- -- --   Liver Function Tests: No results found for this basename: AST:5,ALT:5,ALKPHOS:5,BILITOT:5,PROT:5,ALBUMIN:5 in the last 168 hours No results found for this basename: LIPASE:5,AMYLASE:5 in the last 168 hours No results found for this basename: AMMONIA:5 in the last 168 hours CBC:  Lab 07/08/12 0620 07/07/12 0540  WBC 4.4 4.4  NEUTROABS -- --  HGB 9.4* 9.1*  HCT 29.5* 29.2*  MCV 95.2 93.9  PLT 223 213   Cardiac Enzymes:  Lab 07/08/12 1818 07/08/12 1038 07/07/12 1653 07/07/12 0602  CKTOTAL 208 201 -- --  CKMB 2.3 2.3 -- --  CKMBINDEX -- -- -- --  TROPONINI <0.30 <0.30 <0.30 <0.30   BNP: BNP (last 3 results)  Basename 09/30/11 1419  PROBNP 503.4*   CBG:  Lab 07/09/12 1134 07/09/12 0751 07/08/12 1947 07/08/12 1635 07/08/12 1125  GLUCAP 237* 249* 182* 141* 145*   Time coordinating discharge: 40 minutes  Signed:  Challen Spainhour A  [facility] 07/09/2012, 2:13 PM

## 2012-07-09 NOTE — Progress Notes (Signed)
Pt complaining of "stabbing" pain at urinary catheter site.  Pt rates pain a 10/10 on the 0-10 scale.  MD notified.  New orders received.  Will continue to monitor.

## 2012-07-09 NOTE — Progress Notes (Signed)
CSW placed fl2 in shadow chart, along with pt pasarr number. .Clinical social worker continuing to follow pt to assist with pt dc plans and further csw needs.   Catha Gosselin, Theresia Majors  [phone] .07/09/2012 10:47am

## 2012-07-09 NOTE — Progress Notes (Signed)
.  Clinical social worker assisted with patient discharge to skilled nursing facility, [facility] [employer] and rehab. .Patient transportation provided by [employer] [facility] with patient chart copy. .No further Clinical Social Work needs, signing off.   Catha Gosselin, Theresia Majors  [phone] .07/09/2012 14:51pm

## 2012-07-09 NOTE — Progress Notes (Signed)
Pt unable to void and MD was called to I/O cath prn every 6 hours if unable to void/ Dr. Betti Cruz

## 2012-07-09 NOTE — Progress Notes (Signed)
Brief narrative:  56 year old male with an extensive cardiac history including multiple myocardial infarctions, multiple stents, systolic heart failure, recent extraction of biventricular ICD secondary to infection on 06/27/2012. He is currently wearing a LifeVest. He was discharged to a skilled nursing facility. He just recently had his Lasix restarted 3 days ago, but has been having difficulty urinating. He awoke tonight with shortness of breath and substernal chest pain. He was taken to [facility] where he was evaluated. His first troponin was negative, but he had a pro-BNP of 8200 and chest x-ray supporting pulmonary edema. He was given Lasix 60 mg IV, but had not urinated. Due to his request and his cardiologist being in [place] he was transferred to [facility] for further workup and evaluation. Just prior to his arrival, he began to have green bilious emesis.   Subjective: Had foley cath removed during the night. Has had not urinated since last night. Denies any pain. Breathing improved.  Objective: Vital signs in last 24 hours: Filed Vitals:   07/09/12 0230 07/09/12 0500 07/09/12 0605 07/09/12 0654  BP: 108/62 100/61 110/60   Pulse:  63    Temp:  97.9 F (36.6 C)    TempSrc:  Oral    Resp:      Height:      Weight:    93.622 kg (206 lb 6.4 oz)  SpO2:  96%     Weight change: -4.536 kg (-10 lb)  Intake/Output Summary (Last 24 hours) at 07/09/12 1041 Last data filed at 07/09/12 0429  Gross per 24 hour  Intake    240 ml  Output   2701 ml  Net  -2461 ml    Physical Exam: General: Awake, Oriented, No acute distress. HEENT: EOMI. Neck: Supple Chest: Left upper chest (pacemaker) site, healing well, no redness or pus. CV: S1 and S2 Lungs: Clear to ascultation bilaterally Abdomen: Soft, Nontender, Nondistended, +bowel sounds. Ext: Good pulses. 1+ LE edema.  Lab Results: Basic Metabolic Panel:  Lab 07/09/12 1610 07/08/12 0620 07/07/12 0540  NA 135 137 138    K 4.2 4.3 3.6  CL 95* 98 98  CO2 30 33* 32  GLUCOSE 230* 148* 156*  BUN 47* 44* 40*  CREATININE 2.41* 2.46* 2.22*  CALCIUM 8.7 9.0 8.9  MG -- -- --  PHOS -- -- --   Liver Function Tests: No results found for this basename: AST:5,ALT:5,ALKPHOS:5,BILITOT:5,PROT:5,ALBUMIN:5 in the last 168 hours No results found for this basename: LIPASE:5,AMYLASE:5 in the last 168 hours No results found for this basename: AMMONIA:5 in the last 168 hours CBC:  Lab 07/08/12 0620 07/07/12 0540  WBC 4.4 4.4  NEUTROABS -- --  HGB 9.4* 9.1*  HCT 29.5* 29.2*  MCV 95.2 93.9  PLT 223 213   Cardiac Enzymes:  Lab 07/08/12 1818 07/08/12 1038 07/07/12 1653 07/07/12 0602  CKTOTAL 208 201 -- --  CKMB 2.3 2.3 -- --  CKMBINDEX -- -- -- --  TROPONINI <0.30 <0.30 <0.30 <0.30   BNP (last 3 results)  Basename 09/30/11 1419  PROBNP 503.4*   CBG:  Lab 07/09/12 0751 07/08/12 1947 07/08/12 1635 07/08/12 1125 07/08/12 0736  GLUCAP 249* 182* 141* 145* 124*   No results found for this basename: HGBA1C:5 in the last 72 hours Other Labs: No components found with this basename: POCBNP:3  Lab 07/07/12 1444  DDIMER 0.57*   No results found for this basename: CHOL:2,HDL:2,LDLCALC:2,TRIG:2,CHOLHDL:2,LDLDIRECT:2 in the last 168 hours No results found for this basename: TSH,T4TOTAL,FREET3,T3FREE,FREET4,THYROIDAB in the last  168 hours No results found for this basename: VITAMINB12:2,FOLATE:2,FERRITIN:2,TIBC:2,IRON:2,RETICCTPCT:2 in the last 168 hours  Micro Results: No results found for this or any previous visit (from the past 240 hour(s)).  Studies/Results: Dg Chest Port 1 View  07/08/2012  *RADIOLOGY REPORT*  Clinical Data: Fluid retention, myocardial infarction, pacemaker removal, CHF  PORTABLE CHEST - 1 VIEW  Comparison: 07/07/2012; 06/27/2012; 06/25/2012  Findings: Grossly unchanged enlarged cardiac silhouette and mediastinal contours.  External pacemaker devices again overlying the thorax.  Mild pulmonary  venous congestion without frank evidence of pulmonary edema.  No definite pleural effusion or pneumothorax.  Unchanged bones.  IMPRESSION: Cardiomegaly and mild pulmonary venous congestion.  Original Report Authenticated By: Waynard Reeds, M.D.    Medications: I have reviewed the patient's current medications. Scheduled Meds:   . amiodarone  200 mg Oral BID  . aspirin EC  81 mg Oral Daily  . carvedilol  3.125 mg Oral BID WC  . colchicine  0.6 mg Oral Daily  . docusate sodium  100 mg Oral BID  . donepezil  5 mg Oral QHS  . febuxostat  40 mg Oral Daily  . fenofibrate  160 mg Oral Daily  . furosemide  60 mg Intravenous BID  . furosemide  60 mg Oral BID  . gabapentin  300 mg Oral TID  . hydrALAZINE  25 mg Oral Q8H  . insulin aspart  0-15 Units Subcutaneous TID WC  . insulin aspart  0-5 Units Subcutaneous QHS  . insulin glargine  10 Units Subcutaneous QHS  . isosorbide mononitrate  60 mg Oral QHS  . pantoprazole  80 mg Oral Q1200  . phenazopyridine  100 mg Oral TID WC  . phenazopyridine  100 mg Oral Once  . potassium chloride  20 mEq Oral BID  . Tamsulosin HCl  0.4 mg Oral Daily  . Ticagrelor  90 mg Oral BID  . DISCONTD: furosemide  60 mg Oral BID  . DISCONTD: phenazopyridine  200 mg Oral TID WC   Continuous Infusions:  PRN Meds:.HYDROcodone-acetaminophen, HYDROmorphone (DILAUDID) injection, nitroGLYCERIN, ondansetron (ZOFRAN) IV  Assessment/Plan: Acute exacerbation of Chronic systolic Congestive heart failure / ischemic cardiomyopathy  Lasix transitioned to IV lasix to PO lasix. Continue coreg, no ACE/ARB due to renal failure.  2-D echocardiogram on 07/08/2012, cavity size was mildly dilated, systolic function was moderately severely reduced, ejection fraction 30-35%, dyskinesis of mid distalanteroseptal, anterior and apical myocardium.  Findings are consistent with pseudo-normal left ventricular filling pattern, concomitant abnormal relaxation and increased filling pressure (grade  2 diastolic dysfunction), septal motion showed abnormal function, and dyssynergy, and paradox, left atrium was severely dilated, right ventricle cavity size was moderately dilated, right atrium dilated, mild tricuspid valve regurgitation, pulmonary artery pressure 70 mmHg. Prior echo in our system from 2011 with EF of 15%. I/Os, net 5.4L negative so far.  Chest pain with complex history of coronary artery disease  Pain has entirely resolved yesterday, suspect the sensation the patient's chest was more related to volume overload/pulmonary edema.  Cardiac enzymes were cycled and was negative. Continue ASA/Brilinta/Coreg/fenofibrate.  Urinary retention  Likely related to BPH.  Foley catheter was discontinued during the night, has not had any urine since last night.  Nursing staff attempted I/O cath without success. Urology consulted. May need replacement of Foley cath with outpatient voiding trial.  Vomiting  Resolved   Peripheral vascular disease  Stable  History of V. fib arrest  Status post AICD implantation 2006 with removal 06/20/2012 due to concern for pocket infection  is currently wearing a LifeVest, to have another ICD placed upon FU with cards.  ICD infection, chronic. Was evaluated by cardiology, was initially on Linezolid.   Diabetes mellitus Stable, SSI.  Hypertension Stable   Chronic kidney disease stage III Creatinine up slightly from baseline, continue diuresis though   Hyperlipidemia  Stable.  History of stroke Continue ASA. Stable.  Code Status Full   Disposition Plan Back to SNF in 24H, pending insurance authorization and improvement in urinary retention.  Consultants None   Antibiotics None   DVT prophylaxis  Lovenox   LOS: 2 days  [facility] A, MD 07/09/2012, 10:41 AM

## 2012-07-10 ENCOUNTER — Encounter: Payer: Medicare Other | Admitting: Internal Medicine

## 2012-07-10 MED FILL — Perflutren Lipid Microsphere IV Susp 6.52 MG/ML: INTRAVENOUS | Qty: 2 | Status: AC

## 2012-07-17 ENCOUNTER — Ambulatory Visit (INDEPENDENT_AMBULATORY_CARE_PROVIDER_SITE_OTHER): Payer: Medicare Other | Admitting: *Deleted

## 2012-07-17 ENCOUNTER — Ambulatory Visit: Payer: Medicare Other

## 2012-07-17 DIAGNOSIS — I428 Other cardiomyopathies: Secondary | ICD-10-CM

## 2012-07-22 ENCOUNTER — Ambulatory Visit: Payer: Medicare Other | Admitting: Internal Medicine

## 2012-07-28 ENCOUNTER — Emergency Department (HOSPITAL_COMMUNITY): Payer: Medicare Other

## 2012-07-28 ENCOUNTER — Encounter (HOSPITAL_COMMUNITY): Payer: Self-pay | Admitting: Emergency Medicine

## 2012-07-28 ENCOUNTER — Emergency Department (HOSPITAL_COMMUNITY)
Admission: EM | Admit: 2012-07-28 | Discharge: 2012-07-28 | Disposition: A | Discharge status: Home / Self Care | Payer: Medicare Other | Attending: Emergency Medicine | Admitting: Emergency Medicine

## 2012-07-28 DIAGNOSIS — I509 Heart failure, unspecified: Secondary | ICD-10-CM | POA: Insufficient documentation

## 2012-07-28 DIAGNOSIS — E785 Hyperlipidemia, unspecified: Secondary | ICD-10-CM | POA: Insufficient documentation

## 2012-07-28 DIAGNOSIS — I2510 Atherosclerotic heart disease of native coronary artery without angina pectoris: Secondary | ICD-10-CM | POA: Insufficient documentation

## 2012-07-28 DIAGNOSIS — Z79899 Other long term (current) drug therapy: Secondary | ICD-10-CM | POA: Insufficient documentation

## 2012-07-28 DIAGNOSIS — N19 Unspecified kidney failure: Secondary | ICD-10-CM | POA: Insufficient documentation

## 2012-07-28 DIAGNOSIS — N183 Chronic kidney disease, stage 3 unspecified: Secondary | ICD-10-CM | POA: Insufficient documentation

## 2012-07-28 DIAGNOSIS — I129 Hypertensive chronic kidney disease with stage 1 through stage 4 chronic kidney disease, or unspecified chronic kidney disease: Secondary | ICD-10-CM | POA: Insufficient documentation

## 2012-07-28 DIAGNOSIS — E119 Type 2 diabetes mellitus without complications: Secondary | ICD-10-CM | POA: Insufficient documentation

## 2012-07-28 DIAGNOSIS — Z8673 Personal history of transient ischemic attack (TIA), and cerebral infarction without residual deficits: Secondary | ICD-10-CM | POA: Insufficient documentation

## 2012-07-28 LAB — BASIC METABOLIC PANEL
BUN: 49 mg/dL — ABNORMAL HIGH (ref 6–23)
Chloride: 95 mEq/L — ABNORMAL LOW (ref 96–112)
Creatinine, Ser: 2.57 mg/dL — ABNORMAL HIGH (ref 0.50–1.35)
GFR calc Af Amer: 30 mL/min — ABNORMAL LOW (ref >90)

## 2012-07-28 LAB — CBC WITH DIFFERENTIAL/PLATELET
Eosinophils Relative: 4 % (ref 0–5)
Lymphocytes Relative: 14 % (ref 12–46)
Lymphs Abs: 0.4 10*3/uL — ABNORMAL LOW (ref 0.7–4.0)
MCV: 93.6 fL (ref 78.0–100.0)
Neutro Abs: 2.4 10*3/uL (ref 1.7–7.7)
Neutrophils Relative %: 75 % (ref 43–77)
Platelets: 166 10*3/uL (ref 150–400)
RBC: 3.46 MIL/uL — ABNORMAL LOW (ref 4.22–5.81)
WBC: 3.2 10*3/uL — ABNORMAL LOW (ref 4.0–10.5)

## 2012-07-28 MED ORDER — ASPIRIN 81 MG PO CHEW: 324.0000 mg | CHEWABLE_TABLET | Freq: Once | ORAL | Status: DC

## 2012-07-28 NOTE — ED Notes (Signed)
Patient strarted having chest pain around 1pm SOB prior to that describes as elephant sitting on her chest.  Rates pain 8/10 nothing makes it better.  [facility] gave him two nitro prior to EMS arriving and EMS gave him two more with no relief.  He is allergic to Aspirin and did not receive any.  He wears a "life vest" an external defibrillator and had a previous one that got infected.  Patient has had seven stents placed in the past.  Patient has had multiple heart. Attacks.  Report received from [facility].

## 2012-07-28 NOTE — ED Provider Notes (Signed)
History     CSN: [id number]  Arrival date & time 07/28/12  1541   First MD Initiated Contact with Patient 07/28/12 1621      Chief Complaint  Patient presents with  . Chest Pain     Patient is a 56 y.o. male presenting with chest pain. The history is provided by the patient.  Chest Pain Episode onset: this morning. Chest pain occurs constantly. The chest pain is worsening. The severity of the pain is moderate. The quality of the pain is described as pressure-like. The pain does not radiate. Exacerbated by: nothing. Primary symptoms include shortness of breath. Pertinent negatives for primary symptoms include no fever.  Associated symptoms include weakness. He tried nitroglycerin for the symptoms.   pt presents with CP/SOB He reports h/o CHF and he feels he may have CHF today.  He reports he used to have ICD but this was removed due to infection and he now wears external vest.  He denies shocks today He reports that the infection is supposed to be drained on 9/4  He reports that he feels his right arm is weaker than normal, but he woke up with this symptom and he is not sure when it started  Past Medical History  Diagnosis Date  . Dyslipidemia     takes Tricor daily  . Stroke   . Asthma   . PONV (postoperative nausea and vomiting)   . Atrial fibrillation     takes Amiodarone daily  . Coronary artery disease   . Peripheral vascular disease   . Myocardial infarction     several;but doesn't recall the last time  . CHF (congestive heart failure)     was on Lasix but has been off for a couple of weeks  . Shortness of breath     with exertion  . Pneumonia     history of   . Peripheral neuropathy   . Arthritis     left foot and left leg  . Gout     takes Colchicine daily  . Scratches     all over legs from dog   . H/O hiatal hernia   . GERD (gastroesophageal reflux disease)     takes Omeprazole daily  . Hepatitis     hx of;6-23yrs ago;doesn't remember which one  .  Chronic constipation   . Chronic diarrhea   . Hemorrhoids   . Urinary frequency   . Urinary urgency   . Enlarged prostate     takes Flomax daily  . History of bladder infections   . Chronic kidney disease, stage 3, mod decreased GFR     pt states NO DIALYSIS  . Diabetes mellitus     novolog daily and lantus 20units nightly  . Depression     takes Aricept nightly  . Insomnia   . Hypertension     takes Carvedilol and Imdur daily and Hydralazine daily    Past Surgical History  Procedure Date  . Internal pacemaker     then removedp;2nd placed along with defibrillator   . Toe amputation     2nd and 4th toe on left foot  . Cardiac catheterization 07/05/10/12  . Colonoscopy   . Esophagogastroduodenoscopy   . Pacemaker lead removal 06/26/2012    Procedure: PACEMAKER LEAD REMOVAL;  Surgeon: Marinus Maw, MD;  Location: [facility] OR;  Service: Cardiovascular;  Laterality: N/A;    Family History  Problem Relation Age of Onset  . Hypertension Mother   . Diabetes Mother   .  Diabetes Brother   . Hypertension Brother     History  Substance Use Topics  . Smoking status: Never Smoker   . Smokeless tobacco: Not on file  . Alcohol Use: No      Review of Systems  Constitutional: Negative for fever.  Respiratory: Positive for shortness of breath.   Cardiovascular: Positive for chest pain.  Neurological: Positive for weakness.  All other systems reviewed and are negative.    Allergies  Cephalosporins; Morphine and related; and Penicillins  Home Medications   Current Outpatient Rx  Name Route Sig Dispense Refill  . ACETAMINOPHEN ER 650 MG PO TBCR Oral Take 650 mg by mouth every 4 (four) hours as needed. PAIN    . AMIODARONE HCL 200 MG PO TABS Oral Take 200 mg by mouth 2 (two) times daily.      Marland Kitchen CARVEDILOL 6.25 MG PO TABS Oral Take 1 tablet (6.25 mg total) by mouth 2 (two) times daily with a meal. 60 tablet 3  . COLCHICINE 0.6 MG PO TABS Oral Take 0.6 mg by mouth daily.    .  DONEPEZIL HCL 5 MG PO TABS Oral Take 5 mg by mouth at bedtime.    . FEBUXOSTAT 40 MG PO TABS Oral Take 40 mg by mouth daily.    . FENOFIBRATE 145 MG PO TABS Oral Take 145 mg by mouth daily.    . FUROSEMIDE 20 MG PO TABS Oral Take 3 tablets (60 mg total) by mouth 2 (two) times daily. 30 tablet   . GABAPENTIN 300 MG PO CAPS Oral Take 300 mg by mouth 3 (three) times daily.      Marland Kitchen HYDRALAZINE HCL 25 MG PO TABS Oral Take 25 mg by mouth 3 (three) times daily.      Marland Kitchen HYDROCODONE-ACETAMINOPHEN 5-500 MG PO TABS Oral Take 1 tablet by mouth at bedtime. PAIN    . INSULIN ASPART 100 UNIT/ML [place] SOLN Subcutaneous Inject 1-12 Units into the skin 3 (three) times daily before meals. PER SLIDING SCALE    . INSULIN GLARGINE 100 UNIT/ML [place] SOLN Subcutaneous Inject 10 Units into the skin at bedtime.    . ISOSORBIDE MONONITRATE ER 60 MG PO TB24 Oral Take 90 mg by mouth at bedtime.      Marland Kitchen MAGNESIUM OXIDE 400 MG PO TABS Oral Take 400 mg by mouth 2 (two) times daily.    Marland Kitchen NITROGLYCERIN 0.4 MG SL SUBL Sublingual Place 0.4 mg under the tongue every 5 (five) minutes as needed. For chest pain     . OMEPRAZOLE 20 MG PO CPDR Oral Take 40 mg by mouth 2 (two) times daily.    Marland Kitchen POTASSIUM CHLORIDE ER 10 MEQ PO TBCR Oral Take 10 mEq by mouth daily.    Marland Kitchen TAMSULOSIN HCL 0.4 MG PO CAPS Oral Take 0.4 mg by mouth daily.    Marland Kitchen TICAGRELOR 90 MG PO TABS Oral Take 90 mg by mouth 2 (two) times daily.     Marland Kitchen VITAMIN B-12 250 MCG PO TABS Oral Take 250 mcg by mouth daily.      BP 120/75  Pulse 58  Temp 98.1 F (36.7 C) (Oral)  Resp 17  SpO2 100%  Physical Exam CONSTITUTIONAL: Well developed/well nourished HEAD AND FACE: Normocephalic/atraumatic EYES: EOMI/PERRL ENMT: Mucous membranes moist NECK: supple no meningeal signs SPINE:entire spine nontender CV: S1/S2 noted Chest - left upper chest - erythema and fluctuant pocket noted.  No crepitance noted, no tenderness or induration is noted External defib vest noted LUNGS:  coarse BS  noted bilaterally, but he is able to speak to me clear.  Mild tachypnea noted ABDOMEN: soft, nontender, no rebound or guarding GU:no cva tenderness NEURO: Pt is awake/alert, moves all extremitiesx4.  No arm or leg drift is noted ( no focal weakness) EXTREMITIES: pulses normal, full ROM SKIN: warm, color normal PSYCH: no abnormalities of mood noted  ED Course  Procedures   4:59 PM Pt has allergy to ASA, none given Pt with chest pressure and SOB He reports the site of previous ICD is worse and he thinks infection is worse He has external defibrillator as his ICD was removed Pt awake/alert, at this time, protecting airway    Pt felt improved He is requesting discharge He reports he is to see his PCP tomorrow at nursing facility, and this is the physician that is monitoring his icd infection.  He wants to go back and see this physician.  Also he reports all of his CHF meds were given prior to transport and he feels he has urine output and feels improved from CHF standpoint.  He is in no distress, no active CP.  He is to see his cardiologist later this week.     MDM  Nursing notes including past medical history and social history reviewed and considered in documentation xrays reviewed and considered Labs/vital reviewed and considered Previous records reviewed and considered        Date: 07/28/2012  Rate: 59  Rhythm: normal sinus rhythm  QRS Axis: right  Intervals: normal  ST/T Wave abnormalities: nonspecific ST changes  Conduction Disutrbances:first-degree A-V block   Narrative Interpretation:   Old EKG Reviewed: unchanged    Joya Gaskins, MD 07/28/12 2046

## 2012-07-28 NOTE — ED Notes (Signed)
Patient is being discharged to the [facility] and [employer].  MD advised the facility nothing new was found and patient was feeling better.  The facility advised that the patient complained of pressure and that is the reason he was brought to the hospital.  Patient is alert and oriented and not complaining of pressure any more.  Patient is being transported back to the facility by [facility].

## 2012-07-28 NOTE — ED Notes (Signed)
Patient says he had a dual pacemaker/defibrillator removed last month due to infection.  Patient says a pocket has formed on the removal site and he thinks that is where the pain is coming from.  Patient said the pocket was set to be drained on September 4 if it had not gone down.  The cardiologist Dr. Ladona Ridgel with [facility].  "Dr. Annitta Needs" a cardiologist with [facility] is his Cardilogist and should be contacted.

## 2012-07-31 ENCOUNTER — Encounter: Payer: Self-pay | Admitting: *Deleted

## 2012-07-31 ENCOUNTER — Ambulatory Visit (INDEPENDENT_AMBULATORY_CARE_PROVIDER_SITE_OTHER): Payer: Medicare Other | Admitting: Internal Medicine

## 2012-07-31 ENCOUNTER — Encounter: Payer: Self-pay | Admitting: Internal Medicine

## 2012-07-31 VITALS — BP 116/72 | HR 64 | Ht 69.5 in | Wt 233.0 lb

## 2012-07-31 DIAGNOSIS — I480 Paroxysmal atrial fibrillation: Secondary | ICD-10-CM

## 2012-07-31 DIAGNOSIS — I472 Ventricular tachycardia, unspecified: Secondary | ICD-10-CM

## 2012-07-31 DIAGNOSIS — I4891 Unspecified atrial fibrillation: Secondary | ICD-10-CM

## 2012-07-31 NOTE — Assessment & Plan Note (Signed)
He is maintaining NSR. He will continue his current medical therapy.

## 2012-07-31 NOTE — Assessment & Plan Note (Signed)
He is s/p extraction and is now ready for ICD reimplant. I have discussed the risks/benefits/goals/expectations of ICD implant with the patient and he wishes to proceed.

## 2012-07-31 NOTE — Progress Notes (Signed)
HPI [name] returns today for followup. He is a pleasant 56 yo man with multiple medical problems including chronic systolic CHF, LBBB/IVCD, who underwent ICD implant and subsequent lead extraction. He returns today for followup. He denies chest pain, fevers, chills or syncope. He is wearing a life vest. No drainage from his site.  Allergies  Allergen Reactions  . Cephalosporins Nausea Only  . Morphine And Related Other (See Comments)    hypotension  . Penicillins Other (See Comments)    Reaction as a child -unknown     Current Outpatient Prescriptions  Medication Sig Dispense Refill  . acetaminophen (TYLENOL) 650 MG CR tablet Take 650 mg by mouth every 4 (four) hours as needed. PAIN      . amiodarone (PACERONE) 200 MG tablet Take 200 mg by mouth 2 (two) times daily.        . carvedilol (COREG) 6.25 MG tablet Take 1 tablet (6.25 mg total) by mouth 2 (two) times daily with a meal.  60 tablet  3  . clindamycin (CLEOCIN) 300 MG capsule Take 300 mg by mouth 3 (three) times daily.       . colchicine 0.6 MG tablet Take 0.6 mg by mouth daily.      Marland Kitchen docusate sodium (COLACE) 100 MG capsule Take 100 mg by mouth daily.      Marland Kitchen donepezil (ARICEPT) 5 MG tablet Take 5 mg by mouth at bedtime.      . febuxostat (ULORIC) 40 MG tablet Take 40 mg by mouth daily.      . fenofibrate (TRICOR) 145 MG tablet Take 145 mg by mouth daily.      . furosemide (LASIX) 20 MG tablet Take 3 tablets (60 mg total) by mouth 2 (two) times daily.  30 tablet    . gabapentin (NEURONTIN) 300 MG capsule Take 300 mg by mouth 3 (three) times daily.        . hydrALAZINE (APRESOLINE) 25 MG tablet Take 25 mg by mouth 3 (three) times daily.        Marland Kitchen HYDROcodone-acetaminophen (VICODIN) 5-500 MG per tablet Take 1 tablet by mouth at bedtime. PAIN      . insulin aspart protamine-insulin aspart (NOVOLOG 70/30) (70-30) 100 UNIT/ML injection Inject 1-12 Units into the skin.      Marland Kitchen insulin glargine (LANTUS) 100 UNIT/ML injection Inject 20  Units into the skin at bedtime.       . isosorbide mononitrate (IMDUR) 60 MG 24 hr tablet Take 90 mg by mouth at bedtime.        . magnesium oxide (MAG-OX) 400 MG tablet Take 400 mg by mouth 2 (two) times daily.      . metolazone (ZAROXOLYN) 5 MG tablet Take 5 mg by mouth daily.      . nitroGLYCERIN (NITROSTAT) 0.4 MG SL tablet Place 0.4 mg under the tongue every 5 (five) minutes as needed. For chest pain       . omeprazole (PRILOSEC) 20 MG capsule Take 40 mg by mouth 2 (two) times daily.      . potassium chloride (K-DUR) 10 MEQ tablet Take 10 mEq by mouth daily.      . Tamsulosin HCl (FLOMAX) 0.4 MG CAPS Take 0.4 mg by mouth daily.      . Ticagrelor (BRILINTA) 90 MG TABS tablet Take 90 mg by mouth 2 (two) times daily.       . vitamin B-12 (CYANOCOBALAMIN) 250 MCG tablet Take 250 mcg by mouth daily.      Marland Kitchen  zolpidem (AMBIEN) 5 MG tablet Take 5 mg by mouth at bedtime as needed.         Past Medical History  Diagnosis Date  . Dyslipidemia     takes Tricor daily  . Stroke   . Asthma   . PONV (postoperative nausea and vomiting)   . Atrial fibrillation     takes Amiodarone daily  . Coronary artery disease   . Peripheral vascular disease   . Myocardial infarction     several;but doesn't recall the last time  . CHF (congestive heart failure)     was on Lasix but has been off for a couple of weeks  . Shortness of breath     with exertion  . Pneumonia     history of   . Peripheral neuropathy   . Arthritis     left foot and left leg  . Gout     takes Colchicine daily  . Scratches     all over legs from dog   . H/O hiatal hernia   . GERD (gastroesophageal reflux disease)     takes Omeprazole daily  . Hepatitis     hx of;6-65yrs ago;doesn't remember which one  . Chronic constipation   . Chronic diarrhea   . Hemorrhoids   . Urinary frequency   . Urinary urgency   . Enlarged prostate     takes Flomax daily  . History of bladder infections   . Chronic kidney disease, stage 3, mod  decreased GFR     pt states NO DIALYSIS  . Diabetes mellitus     novolog daily and lantus 20units nightly  . Depression     takes Aricept nightly  . Insomnia   . Hypertension     takes Carvedilol and Imdur daily and Hydralazine daily    ROS:   All systems reviewed and negative except as noted in the HPI.   Past Surgical History  Procedure Date  . Internal pacemaker     then removedp;2nd placed along with defibrillator   . Toe amputation     2nd and 4th toe on left foot  . Cardiac catheterization 07/05/10/12  . Colonoscopy   . Esophagogastroduodenoscopy   . Pacemaker lead removal 06/26/2012    Procedure: PACEMAKER LEAD REMOVAL;  Surgeon: Marinus Maw, MD;  Location: [facility] OR;  Service: Cardiovascular;  Laterality: N/A;     Family History  Problem Relation Age of Onset  . Hypertension Mother   . Diabetes Mother   . Diabetes Brother   . Hypertension Brother      History   Social History  . Marital Status: Divorced    Spouse Name: N/A    Number of Children: N/A  . Years of Education: N/A   Occupational History  . Not on file.   Social History Main Topics  . Smoking status: Never Smoker   . Smokeless tobacco: Not on file  . Alcohol Use: No  . Drug Use: No  . Sexually Active: Not Currently   Other Topics Concern  . Not on file   Social History Narrative  . No narrative on file     BP 116/72  Pulse 64  Ht 5' 9.5" (1.765 m)  Wt 233 lb (105.688 kg)  BMI 33.91 kg/m2  Physical Exam:  Well appearing NAD HEENT: Unremarkable Neck:  No JVD, no thyromegally Lungs:  Clear with no wheezes. Minimal basilar rales. HEART:  Regular rate rhythm, no murmurs, no rubs, no clicks Abd:  soft, positive bowel sounds, no organomegally, no rebound, no guarding Ext:  2 plus pulses, no edema, no cyanosis, no clubbing Skin:  No rashes no nodules Neuro:  CN II through XII intact, motor grossly intact  EKG NSR with IVCD/LBBB  Assess/Plan:

## 2012-08-01 ENCOUNTER — Other Ambulatory Visit: Payer: Self-pay | Admitting: *Deleted

## 2012-08-01 DIAGNOSIS — I472 Ventricular tachycardia: Secondary | ICD-10-CM

## 2012-08-01 DIAGNOSIS — I255 Ischemic cardiomyopathy: Secondary | ICD-10-CM

## 2012-08-12 ENCOUNTER — Encounter (HOSPITAL_COMMUNITY): Payer: Self-pay | Admitting: Pharmacy Technician

## 2012-08-20 ENCOUNTER — Telehealth: Payer: Self-pay | Admitting: Internal Medicine

## 2012-08-20 MED ORDER — SODIUM CHLORIDE 0.9 % IV SOLN
1500.0000 mg | INTRAVENOUS | Status: DC
  Filled 2012-08-20: qty 1500

## 2012-08-20 MED ORDER — SODIUM CHLORIDE 0.9 % IR SOLN
80.0000 mg | Status: DC
  Filled 2012-08-20: qty 2

## 2012-08-20 NOTE — Telephone Encounter (Signed)
Spoke with patient and answered his questions in regards to his procedure tomorrow

## 2012-08-20 NOTE — Telephone Encounter (Signed)
New problem:  Has question regarding upcoming surgery.

## 2012-08-21 ENCOUNTER — Inpatient Hospital Stay (HOSPITAL_COMMUNITY)
Admission: RE | Admit: 2012-08-21 | Discharge: 2012-08-25 | DRG: 226 | Disposition: A | Discharge status: Skilled Nursing Facility | Payer: Medicare Other | Source: Ambulatory Visit | Attending: Internal Medicine | Admitting: Internal Medicine

## 2012-08-21 ENCOUNTER — Inpatient Hospital Stay (HOSPITAL_COMMUNITY): Payer: Medicare Other

## 2012-08-21 ENCOUNTER — Encounter (HOSPITAL_COMMUNITY)
Admission: RE | Disposition: A | Discharge status: Skilled Nursing Facility | Payer: Self-pay | Source: Ambulatory Visit | Attending: Internal Medicine

## 2012-08-21 ENCOUNTER — Encounter (HOSPITAL_COMMUNITY): Payer: Self-pay | Admitting: General Practice

## 2012-08-21 DIAGNOSIS — L8992 Pressure ulcer of unspecified site, stage 2: Secondary | ICD-10-CM | POA: Diagnosis present

## 2012-08-21 DIAGNOSIS — I509 Heart failure, unspecified: Secondary | ICD-10-CM | POA: Diagnosis present

## 2012-08-21 DIAGNOSIS — N390 Urinary tract infection, site not specified: Secondary | ICD-10-CM | POA: Diagnosis present

## 2012-08-21 DIAGNOSIS — G9340 Encephalopathy, unspecified: Secondary | ICD-10-CM | POA: Diagnosis present

## 2012-08-21 DIAGNOSIS — I129 Hypertensive chronic kidney disease with stage 1 through stage 4 chronic kidney disease, or unspecified chronic kidney disease: Secondary | ICD-10-CM | POA: Diagnosis present

## 2012-08-21 DIAGNOSIS — I447 Left bundle-branch block, unspecified: Secondary | ICD-10-CM | POA: Diagnosis present

## 2012-08-21 DIAGNOSIS — I2589 Other forms of chronic ischemic heart disease: Secondary | ICD-10-CM | POA: Diagnosis present

## 2012-08-21 DIAGNOSIS — K7460 Unspecified cirrhosis of liver: Secondary | ICD-10-CM | POA: Diagnosis present

## 2012-08-21 DIAGNOSIS — N184 Chronic kidney disease, stage 4 (severe): Secondary | ICD-10-CM | POA: Diagnosis present

## 2012-08-21 DIAGNOSIS — L89109 Pressure ulcer of unspecified part of back, unspecified stage: Secondary | ICD-10-CM | POA: Diagnosis present

## 2012-08-21 DIAGNOSIS — I2510 Atherosclerotic heart disease of native coronary artery without angina pectoris: Secondary | ICD-10-CM | POA: Diagnosis present

## 2012-08-21 DIAGNOSIS — E785 Hyperlipidemia, unspecified: Secondary | ICD-10-CM | POA: Diagnosis present

## 2012-08-21 DIAGNOSIS — I252 Old myocardial infarction: Secondary | ICD-10-CM

## 2012-08-21 DIAGNOSIS — G609 Hereditary and idiopathic neuropathy, unspecified: Secondary | ICD-10-CM | POA: Diagnosis present

## 2012-08-21 DIAGNOSIS — N400 Enlarged prostate without lower urinary tract symptoms: Secondary | ICD-10-CM | POA: Diagnosis present

## 2012-08-21 DIAGNOSIS — I255 Ischemic cardiomyopathy: Secondary | ICD-10-CM

## 2012-08-21 DIAGNOSIS — J45909 Unspecified asthma, uncomplicated: Secondary | ICD-10-CM | POA: Diagnosis present

## 2012-08-21 DIAGNOSIS — E119 Type 2 diabetes mellitus without complications: Secondary | ICD-10-CM | POA: Diagnosis present

## 2012-08-21 DIAGNOSIS — F329 Major depressive disorder, single episode, unspecified: Secondary | ICD-10-CM | POA: Diagnosis present

## 2012-08-21 DIAGNOSIS — I5023 Acute on chronic systolic (congestive) heart failure: Principal | ICD-10-CM | POA: Diagnosis present

## 2012-08-21 DIAGNOSIS — I4891 Unspecified atrial fibrillation: Secondary | ICD-10-CM | POA: Diagnosis present

## 2012-08-21 DIAGNOSIS — N179 Acute kidney failure, unspecified: Secondary | ICD-10-CM | POA: Diagnosis not present

## 2012-08-21 DIAGNOSIS — F3289 Other specified depressive episodes: Secondary | ICD-10-CM | POA: Diagnosis present

## 2012-08-21 DIAGNOSIS — I472 Ventricular tachycardia: Secondary | ICD-10-CM

## 2012-08-21 DIAGNOSIS — I5043 Acute on chronic combined systolic (congestive) and diastolic (congestive) heart failure: Secondary | ICD-10-CM

## 2012-08-21 DIAGNOSIS — M109 Gout, unspecified: Secondary | ICD-10-CM | POA: Diagnosis present

## 2012-08-21 LAB — GLUCOSE, CAPILLARY: Glucose-Capillary: 253 mg/dL — ABNORMAL HIGH (ref 70–99)

## 2012-08-21 LAB — CBC WITH DIFFERENTIAL/PLATELET
Eosinophils Absolute: 0.1 10*3/uL (ref 0.0–0.7)
Eosinophils Relative: 2 % (ref 0–5)
Hemoglobin: 9.8 g/dL — ABNORMAL LOW (ref 13.0–17.0)
Lymphs Abs: 0.4 10*3/uL — ABNORMAL LOW (ref 0.7–4.0)
MCH: 27.2 pg (ref 26.0–34.0)
MCV: 86.4 fL (ref 78.0–100.0)
Monocytes Absolute: 0.3 10*3/uL (ref 0.1–1.0)
Monocytes Relative: 7 % (ref 3–12)
RBC: 3.6 MIL/uL — ABNORMAL LOW (ref 4.22–5.81)

## 2012-08-21 LAB — SURGICAL PCR SCREEN
MRSA, PCR: POSITIVE — AB
Staphylococcus aureus: POSITIVE — AB

## 2012-08-21 LAB — CBC
HCT: 31.2 % — ABNORMAL LOW (ref 39.0–52.0)
Hemoglobin: 10.1 g/dL — ABNORMAL LOW (ref 13.0–17.0)
MCH: 27.7 pg (ref 26.0–34.0)
MCHC: 32.4 g/dL (ref 30.0–36.0)
RDW: 17.3 % — ABNORMAL HIGH (ref 11.5–15.5)

## 2012-08-21 LAB — HEPATIC FUNCTION PANEL
ALT: 17 U/L (ref 0–53)
AST: 35 U/L (ref 0–37)
Alkaline Phosphatase: 80 U/L (ref 39–117)
Indirect Bilirubin: 0.5 mg/dL (ref 0.3–0.9)
Total Protein: 7.1 g/dL (ref 6.0–8.3)

## 2012-08-21 LAB — COMPREHENSIVE METABOLIC PANEL
Alkaline Phosphatase: 77 U/L (ref 39–117)
BUN: 88 mg/dL — ABNORMAL HIGH (ref 6–23)
Calcium: 9.2 mg/dL (ref 8.4–10.5)
Creatinine, Ser: 3.74 mg/dL — ABNORMAL HIGH (ref 0.50–1.35)
GFR calc Af Amer: 19 mL/min — ABNORMAL LOW (ref >90)
Glucose, Bld: 236 mg/dL — ABNORMAL HIGH (ref 70–99)
Total Protein: 7.1 g/dL (ref 6.0–8.3)

## 2012-08-21 LAB — MAGNESIUM: Magnesium: 2.6 mg/dL — ABNORMAL HIGH (ref 1.5–2.5)

## 2012-08-21 LAB — PROTIME-INR: Prothrombin Time: 16.3 seconds — ABNORMAL HIGH (ref 11.6–15.2)

## 2012-08-21 LAB — BASIC METABOLIC PANEL
BUN: 85 mg/dL — ABNORMAL HIGH (ref 6–23)
Creatinine, Ser: 3.69 mg/dL — ABNORMAL HIGH (ref 0.50–1.35)
GFR calc Af Amer: 20 mL/min — ABNORMAL LOW (ref >90)
GFR calc non Af Amer: 17 mL/min — ABNORMAL LOW (ref >90)
Glucose, Bld: 227 mg/dL — ABNORMAL HIGH (ref 70–99)

## 2012-08-21 LAB — PRO B NATRIURETIC PEPTIDE: Pro B Natriuretic peptide (BNP): 5771 pg/mL — ABNORMAL HIGH (ref 0–125)

## 2012-08-21 LAB — RETICULOCYTES
RBC.: 3.69 MIL/uL — ABNORMAL LOW (ref 4.22–5.81)
Retic Ct Pct: 2.9 % (ref 0.4–3.1)

## 2012-08-21 LAB — AMMONIA: Ammonia: 83 umol/L — ABNORMAL HIGH (ref 11–60)

## 2012-08-21 LAB — APTT: aPTT: 34 seconds (ref 24–37)

## 2012-08-21 SURGERY — IMPLANTABLE CARDIOVERTER DEFIBRILLATOR IMPLANT
Anesthesia: LOCAL

## 2012-08-21 MED ORDER — PANTOPRAZOLE SODIUM 40 MG PO TBEC
40.0000 mg | DELAYED_RELEASE_TABLET | Freq: Every day | ORAL | Status: DC
  Administered 2012-08-21 – 2012-08-25 (×5): 40 mg via ORAL
  Filled 2012-08-21 (×4): qty 1

## 2012-08-21 MED ORDER — SODIUM CHLORIDE 0.9 % IV SOLN: 250.0000 mL | INTRAVENOUS | Status: DC | PRN

## 2012-08-21 MED ORDER — FENOFIBRATE 160 MG PO TABS: 160.0000 mg | ORAL_TABLET | Freq: Every day | ORAL | Status: DC

## 2012-08-21 MED ORDER — SODIUM CHLORIDE 0.9 % IV SOLN: 250.0000 mL | INTRAVENOUS | Status: DC

## 2012-08-21 MED ORDER — ISOSORBIDE MONONITRATE ER 60 MG PO TB24
60.0000 mg | ORAL_TABLET | Freq: Every day | ORAL | Status: DC
  Administered 2012-08-21 – 2012-08-24 (×4): 60 mg via ORAL
  Filled 2012-08-21 (×5): qty 1

## 2012-08-21 MED ORDER — TICAGRELOR 90 MG PO TABS
90.0000 mg | ORAL_TABLET | Freq: Two times a day (BID) | ORAL | Status: DC
  Administered 2012-08-21: 90 mg via ORAL
  Filled 2012-08-21 (×4): qty 1

## 2012-08-21 MED ORDER — CARVEDILOL 6.25 MG PO TABS
6.2500 mg | ORAL_TABLET | Freq: Two times a day (BID) | ORAL | Status: DC
  Administered 2012-08-21: 6.25 mg via ORAL
  Filled 2012-08-21 (×4): qty 1

## 2012-08-21 MED ORDER — NITROGLYCERIN 0.4 MG SL SUBL: 0.4000 mg | SUBLINGUAL_TABLET | SUBLINGUAL | Status: DC | PRN

## 2012-08-21 MED ORDER — SODIUM CHLORIDE 0.9 % IJ SOLN: 3.0000 mL | Freq: Two times a day (BID) | INTRAMUSCULAR | Status: DC

## 2012-08-21 MED ORDER — HYDROCODONE-ACETAMINOPHEN 5-500 MG PO TABS: 1.0000 | ORAL_TABLET | Freq: Three times a day (TID) | ORAL | Status: DC

## 2012-08-21 MED ORDER — COLCHICINE 0.6 MG PO TABS
0.3000 mg | ORAL_TABLET | Freq: Every day | ORAL | Status: DC
  Administered 2012-08-21 – 2012-08-25 (×5): 0.3 mg via ORAL
  Filled 2012-08-21 (×5): qty 0.5

## 2012-08-21 MED ORDER — DONEPEZIL HCL 5 MG PO TABS
5.0000 mg | ORAL_TABLET | Freq: Every day | ORAL | Status: DC
  Administered 2012-08-21: 5 mg via ORAL
  Filled 2012-08-21 (×2): qty 1

## 2012-08-21 MED ORDER — HYDROCODONE-ACETAMINOPHEN 5-325 MG PO TABS
1.0000 | ORAL_TABLET | Freq: Three times a day (TID) | ORAL | Status: DC
  Administered 2012-08-21 – 2012-08-25 (×11): 1 via ORAL
  Filled 2012-08-21 (×13): qty 1

## 2012-08-21 MED ORDER — ACETAMINOPHEN 325 MG PO TABS: 650.0000 mg | ORAL_TABLET | ORAL | Status: DC | PRN

## 2012-08-21 MED ORDER — INSULIN GLARGINE 100 UNIT/ML ~~LOC~~ SOLN
30.0000 [IU] | Freq: Every morning | SUBCUTANEOUS | Status: DC
  Administered 2012-08-21 – 2012-08-23 (×3): 30 [IU] via SUBCUTANEOUS
  Administered 2012-08-24: 11:00:00 via SUBCUTANEOUS
  Administered 2012-08-25: 30 [IU] via SUBCUTANEOUS

## 2012-08-21 MED ORDER — DOCUSATE SODIUM 100 MG PO CAPS
100.0000 mg | ORAL_CAPSULE | Freq: Two times a day (BID) | ORAL | Status: DC
  Administered 2012-08-21 – 2012-08-25 (×8): 100 mg via ORAL
  Filled 2012-08-21 (×10): qty 1

## 2012-08-21 MED ORDER — TAMSULOSIN HCL 0.4 MG PO CAPS
0.4000 mg | ORAL_CAPSULE | Freq: Every day | ORAL | Status: DC
  Administered 2012-08-21 – 2012-08-25 (×5): 0.4 mg via ORAL
  Filled 2012-08-21 (×5): qty 1

## 2012-08-21 MED ORDER — MUPIROCIN 2 % EX OINT
TOPICAL_OINTMENT | CUTANEOUS | Status: AC
  Filled 2012-08-21: qty 22

## 2012-08-21 MED ORDER — ONDANSETRON HCL 4 MG/2ML IJ SOLN: 4.0000 mg | Freq: Four times a day (QID) | INTRAMUSCULAR | Status: DC | PRN

## 2012-08-21 MED ORDER — SODIUM CHLORIDE 0.9 % IJ SOLN: 3.0000 mL | INTRAMUSCULAR | Status: DC | PRN

## 2012-08-21 MED ORDER — DOBUTAMINE IN D5W 4-5 MG/ML-% IV SOLN
5.0000 ug/kg/min | INTRAVENOUS | Status: DC
  Administered 2012-08-21: 2.5 ug/kg/min via INTRAVENOUS
  Administered 2012-08-23: 5 ug/kg/min via INTRAVENOUS
  Filled 2012-08-21 (×3): qty 250

## 2012-08-21 MED ORDER — SODIUM CHLORIDE 0.9 % IJ SOLN
3.0000 mL | Freq: Two times a day (BID) | INTRAMUSCULAR | Status: DC
  Administered 2012-08-22 – 2012-08-24 (×5): 3 mL via INTRAVENOUS

## 2012-08-21 MED ORDER — LINAGLIPTIN 5 MG PO TABS
5.0000 mg | ORAL_TABLET | Freq: Every day | ORAL | Status: DC
  Administered 2012-08-21 – 2012-08-25 (×4): 5 mg via ORAL
  Filled 2012-08-21 (×5): qty 1

## 2012-08-21 MED ORDER — GABAPENTIN 300 MG PO CAPS
300.0000 mg | ORAL_CAPSULE | Freq: Three times a day (TID) | ORAL | Status: DC
  Administered 2012-08-21: 300 mg via ORAL
  Filled 2012-08-21 (×2): qty 1

## 2012-08-21 MED ORDER — HYDRALAZINE HCL 25 MG PO TABS
25.0000 mg | ORAL_TABLET | Freq: Three times a day (TID) | ORAL | Status: DC
  Administered 2012-08-21: 25 mg via ORAL
  Filled 2012-08-21 (×2): qty 1

## 2012-08-21 MED ORDER — SODIUM CHLORIDE 0.45 % IV SOLN: INTRAVENOUS | Status: DC

## 2012-08-21 MED ORDER — MUPIROCIN 2 % EX OINT
TOPICAL_OINTMENT | Freq: Two times a day (BID) | CUTANEOUS | Status: DC
  Administered 2012-08-21: 23:00:00 via NASAL
  Administered 2012-08-22: 1 via NASAL
  Administered 2012-08-22 – 2012-08-25 (×6): via NASAL

## 2012-08-21 NOTE — H&P (Signed)
HPI  [name] returns today for ICD insertion. He is a pleasant 56 yo man with multiple medical problems including chronic systolic CHF, LBBB/IVCD, who underwent ICD implant and subsequent lead extraction. He returns today for insertion of a new device. Over the past few weeks he has had increasing abdominal girth, sob, and peripheral edema. He denies medical non-compliance. During this time, his renal function has worsened. His creatinine is normally in the low 2 range and is now in the mid 3 range. He has had increased peripheral edema. No fever or chills.  Allergies   Allergen  Reactions   .  Cephalosporins  Nausea Only   .  Morphine And Related  Other (See Comments)     hypotension   .  Penicillins  Other (See Comments)     Reaction as a child -unknown    Current Outpatient Prescriptions   Medication  Sig  Dispense  Refill   .  acetaminophen (TYLENOL) 650 MG CR tablet  Take 650 mg by mouth every 4 (four) hours as needed. PAIN     .  amiodarone (PACERONE) 200 MG tablet  Take 200 mg by mouth 2 (two) times daily.     .  carvedilol (COREG) 6.25 MG tablet  Take 1 tablet (6.25 mg total) by mouth 2 (two) times daily with a meal.  60 tablet  3   .  clindamycin (CLEOCIN) 300 MG capsule  Take 300 mg by mouth 3 (three) times daily.     .  colchicine 0.6 MG tablet  Take 0.6 mg by mouth daily.     Marland Kitchen  docusate sodium (COLACE) 100 MG capsule  Take 100 mg by mouth daily.     Marland Kitchen  donepezil (ARICEPT) 5 MG tablet  Take 5 mg by mouth at bedtime.     .  febuxostat (ULORIC) 40 MG tablet  Take 40 mg by mouth daily.     .  fenofibrate (TRICOR) 145 MG tablet  Take 145 mg by mouth daily.     .  furosemide (LASIX) 20 MG tablet  Take 3 tablets (60 mg total) by mouth 2 (two) times daily.  30 tablet    .  gabapentin (NEURONTIN) 300 MG capsule  Take 300 mg by mouth 3 (three) times daily.     .  hydrALAZINE (APRESOLINE) 25 MG tablet  Take 25 mg by mouth 3 (three) times daily.     Marland Kitchen  HYDROcodone-acetaminophen (VICODIN)  5-500 MG per tablet  Take 1 tablet by mouth at bedtime. PAIN     .  insulin aspart protamine-insulin aspart (NOVOLOG 70/30) (70-30) 100 UNIT/ML injection  Inject 1-12 Units into the skin.     Marland Kitchen  insulin glargine (LANTUS) 100 UNIT/ML injection  Inject 20 Units into the skin at bedtime.     .  isosorbide mononitrate (IMDUR) 60 MG 24 hr tablet  Take 90 mg by mouth at bedtime.     .  magnesium oxide (MAG-OX) 400 MG tablet  Take 400 mg by mouth 2 (two) times daily.     .  metolazone (ZAROXOLYN) 5 MG tablet  Take 5 mg by mouth daily.     .  nitroGLYCERIN (NITROSTAT) 0.4 MG SL tablet  Place 0.4 mg under the tongue every 5 (five) minutes as needed. For chest pain     .  omeprazole (PRILOSEC) 20 MG capsule  Take 40 mg by mouth 2 (two) times daily.     .  potassium chloride (K-DUR)  10 MEQ tablet  Take 10 mEq by mouth daily.     .  Tamsulosin HCl (FLOMAX) 0.4 MG CAPS  Take 0.4 mg by mouth daily.     .  Ticagrelor (BRILINTA) 90 MG TABS tablet  Take 90 mg by mouth 2 (two) times daily.     .  vitamin B-12 (CYANOCOBALAMIN) 250 MCG tablet  Take 250 mcg by mouth daily.     Marland Kitchen  zolpidem (AMBIEN) 5 MG tablet  Take 5 mg by mouth at bedtime as needed.      Past Medical History   Diagnosis  Date   .  Dyslipidemia      takes Tricor daily   .  Stroke    .  Asthma    .  PONV (postoperative nausea and vomiting)    .  Atrial fibrillation      takes Amiodarone daily   .  Coronary artery disease    .  Peripheral vascular disease    .  Myocardial infarction      several;but doesn't recall the last time   .  CHF (congestive heart failure)      was on Lasix but has been off for a couple of weeks   .  Shortness of breath      with exertion   .  Pneumonia      history of   .  Peripheral neuropathy    .  Arthritis      left foot and left leg   .  Gout      takes Colchicine daily   .  Scratches      all over legs from dog   .  H/O hiatal hernia    .  GERD (gastroesophageal reflux disease)      takes Omeprazole  daily   .  Hepatitis      hx of;6-38yrs ago;doesn't remember which one   .  Chronic constipation    .  Chronic diarrhea    .  Hemorrhoids    .  Urinary frequency    .  Urinary urgency    .  Enlarged prostate      takes Flomax daily   .  History of bladder infections    .  Chronic kidney disease, stage 3, mod decreased GFR      pt states NO DIALYSIS   .  Diabetes mellitus      novolog daily and lantus 20units nightly   .  Depression      takes Aricept nightly   .  Insomnia    .  Hypertension      takes Carvedilol and Imdur daily and Hydralazine daily    ROS:  All systems reviewed and negative except as noted in the HPI.  Past Surgical History   Procedure  Date   .  Internal pacemaker      then removedp;2nd placed along with defibrillator   .  Toe amputation      2nd and 4th toe on left foot   .  Cardiac catheterization  07/05/10/12   .  Colonoscopy    .  Esophagogastroduodenoscopy    .  Pacemaker lead removal  06/26/2012     Procedure: PACEMAKER LEAD REMOVAL; Surgeon: Marinus Maw, MD; Location: [facility] OR; Service: Cardiovascular; Laterality: N/A;    Family History   Problem  Relation  Age of Onset   .  Hypertension  Mother    .  Diabetes  Mother    .  Diabetes  Brother    .  Hypertension  Brother     History    Social History   .  Marital Status:  Divorced     Spouse Name:  N/A     Number of Children:  N/A   .  Years of Education:  N/A    Occupational History   .  Not on file.    Social History Main Topics   .  Smoking status:  Never Smoker   .  Smokeless tobacco:  Not on file   .  Alcohol Use:  No   .  Drug Use:  No   .  Sexually Active:  Not Currently    Other Topics  Concern   .  Not on file    Social History Narrative   .  No narrative on file    BP 116/72  Pulse 64  Ht 5' 9.5" (1.765 m)  Wt 233 lb (105.688 kg)  BMI 33.91 kg/m2  Physical Exam:  Ill appearing middle aged man, NAD  HEENT: Unremarkable  Neck: 7 cm JVD, no thyromegally  Lungs:  Rales in the bases up a quarter with no wheezes.  HEART: Regular rate rhythm, no murmurs, no rubs, no clicks with distant heart sounds. Abd: soft, positive bowel sounds, no organomegally, no rebound, no guarding  Ext: 2 plus pulses, no edema, no cyanosis, no clubbing  Skin: No rashes no nodules  Neuro: CN II through XII intact, motor grossly intact  EKG  NSR with IVCD/LBBB    Assess/Plan:  1.  Ventricular tachyarrhythmia, appropriate ICD discharges this year - He is s/p extraction and is now ready for ICD reimplant. I have discussed the risks/benefits/goals/expectations of ICD implant with the patient and he wishes to proceed. Unfortunately, the patient has had worsening volume overload and will admit for IV ionotropes before proceeding with insertion of a new device. 2.  PAF (paroxysmal atrial fibrillation) - with a new diagnosis over cirrhosis, will stop amiodarone. 3. Renal failure, stage 4 - will ask our renal service to give recommendations. The patient is currently not interested in HD. 4. Acute on chronic CHF - his symptoms are class 3B, approaching class 4. It is clear that his CHF has worsened after stopping his BiV pacing.  Hopefully we can tune him up enough to proceed with BiV ICD reimplant in next couple of days.  Lewayne Bunting, M.D.

## 2012-08-21 NOTE — Evaluation (Signed)
Physical Therapy Evaluation Patient Details Name: [name] MRN: [id number] DOB: 1956/10/07 Today's Date: 08/21/2012 Time: 4098-1191 PT Time Calculation (min): 24 min  PT Assessment / Plan / Recommendation Clinical Impression  56 y.o. male who presents to [facility] for re-implantation of defibrillator, but his course was complicated by increased fluid throughout his body, and poor renal function.  He presents today with generalized weakness, weakness in his legs, decreased balance, decreased activity tolerance and decreased ability to walk.      PT Assessment  Patient needs continued PT services    Follow Up Recommendations  Skilled nursing facility    Barriers to Discharge  none      Equipment Recommendations  Rolling walker with 5" wheels;Wheelchair (measurements);3 in 1 bedside comode    Recommendations for Other Services   NA  Frequency Min 3X/week    Precautions / Restrictions Precautions Precautions: Fall   Pertinent Vitals/Pain Reports 8/10 left foot pain (charcot deformity) with gait.  O2 sats 94% and HR 64 bpm after gait.       Mobility  Transfers Sit to Stand: 4: Min assist;From elevated surface;With armrests;From bed;With upper extremity assist Stand to Sit: 4: Min assist;With upper extremity assist;To elevated surface;To bed Details for Transfer Assistance: min assist to help boost and steady trunk during transition to stand.  Min assist to help lower to sit with controlled descent.   Ambulation/Gait Ambulation/Gait Assistance: 4: Min assist Ambulation Distance (Feet): 15 Feet Assistive device: Rolling walker Ambulation/Gait Assistance Details: min assist due to pt reporting that he felt woozy and fascial expression consistant with this report.   Gait Pattern: Step-through pattern;Shuffle Gait velocity: less than 1.8 ft/sec putting him at risk for recurrent falls.             PT Diagnosis: Difficulty walking;Abnormality of gait;Generalized weakness;Acute  pain  PT Problem List: Decreased strength;Decreased activity tolerance;Decreased balance;Decreased mobility;Decreased knowledge of use of DME;Pain PT Treatment Interventions: DME instruction;Gait training;Functional mobility training;Therapeutic activities;Therapeutic exercise;Balance training;Neuromuscular re-education;Patient/family education   PT Goals Acute Rehab PT Goals PT Goal Formulation: With patient Time For Goal Achievement: 09/04/12 Potential to Achieve Goals: Good Pt will go Supine/Side to Sit: with modified independence;with HOB 0 degrees PT Goal: Supine/Side to Sit - Progress: Goal set today Pt will go Sit to Supine/Side: with modified independence;with HOB 0 degrees PT Goal: Sit to Supine/Side - Progress: Progressing toward goal Pt will go Sit to Stand: with supervision;with upper extremity assist PT Goal: Sit to Stand - Progress: Goal set today Pt will go Stand to Sit: with supervision;with upper extremity assist PT Goal: Stand to Sit - Progress: Goal set today Pt will Transfer Bed to Chair/Chair to Bed: with supervision PT Transfer Goal: Bed to Chair/Chair to Bed - Progress: Goal set today Pt will Ambulate: 51 - 150 feet;with supervision;with rolling walker PT Goal: Ambulate - Progress: Goal set today  Visit Information  Last PT Received On: 08/21/12 Assistance Needed: +1    Subjective Data  Subjective: 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   Patient Stated Goal: to not lose all he had gained in rehab at SNF so far.     Prior Functioning  Home Living Type of Home: Skilled Nursing Facility Additional Comments: was at SNF for rehab the went to ALF and was getting home PT, now wants to go back to SNF for rehab.      Cognition       Extremity/Trunk Assessment  Right Lower Extremity Assessment RLE ROM/Strength/Tone: Deficits RLE ROM/Strength/Tone Deficits: grossly 3+/5 per functional assessment Left Lower Extremity  Assessment LLE ROM/Strength/Tone: Deficits LLE ROM/Strength/Tone Deficits: grossly 3+/5 per functional assessment.     Balance    End of Session PT - End of Session Equipment Utilized During Treatment: Gait belt Activity Tolerance: Treatment limited secondary to medical complications (Comment);Patient limited by fatigue;Patient limited by pain;Other (comment) (increased fluid throughout body) Patient left: in bed;with call bell/phone within reach (seated EOB where I found him ) Nurse Communication: Mobility status;Other (comment) (spoke with RN tech -recommended BSC for tolieting.  )  GP     Rollene Rotunda. Nahom Carfagno, PT, DPT [phone]   08/21/2012, 5:02 PM

## 2012-08-21 NOTE — Consult Note (Signed)
[facility] CONSULT NOTE    Date: 08/21/2012                  Patient Name:  [name]  MRN: [id number]  DOB: Sep 08, 1956  Age / Sex: 56 y.o., male         PCP: Galvin Proffer, MD                 Service Requesting Consult: Cardiology                  Reason for Consult: Acute on Chronic Kidney Disease             History of Present Illness: Patient is a 56 y.o. male with systolic heart failure NYHA Class III and ventricular tachyarrhythmia who presented today for implantation and ICD of his who was admitted to [facility] on 08/21/2012 for evaluation of increasing fluid retention dyspnea, and worsening renal function. 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 Eliott Nine of [facility]. The patient does not know the origin of his kidney failure, but notes that he has had it for approximately 5 years. He also notes that he has had heart problems for 18 years and also had a stroke at that time. 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 He also has prostatic hyperplasia. The patient notes general worsening of his health over several months. He was recently discharged from the hospital on 07/09/12, where he was treated for CHF exacerbation and urinary retention. He notes thereafter he was transferred to a rehab facility and then to a "nursing home" for the last 11 days. He states that since moving to the nursing home he has not been able to walk secondary to weakness, accumulation of fluid and shortness of breath.    Medications: Outpatient medications: Prescriptions prior to admission  Medication Sig Dispense Refill  . acetaminophen (TYLENOL) 650 MG CR tablet Take 650 mg by mouth every 4 (four) hours as needed. PAIN      . amiodarone (PACERONE) 200 MG tablet Take 200 mg by mouth 2 (two) times daily.        . carvedilol (COREG) 6.25 MG tablet Take 1 tablet (6.25 mg total) by mouth 2  (two) times daily with a meal.  60 tablet  3  . colchicine 0.6 MG tablet Take 0.6 mg by mouth daily.      Marland Kitchen docusate sodium (COLACE) 100 MG capsule Take 100 mg by mouth 2 (two) times daily.       Marland Kitchen donepezil (ARICEPT) 5 MG tablet Take 5 mg by mouth at bedtime.      . febuxostat (ULORIC) 40 MG tablet Take 40 mg by mouth daily.      . fenofibrate micronized (LOFIBRA) 200 MG capsule Take 200 mg by mouth daily before breakfast.      . furosemide (LASIX) 80 MG tablet Take 80 mg by mouth 2 (two) times daily.      Marland Kitchen gabapentin (NEURONTIN) 300 MG capsule Take 300 mg by mouth 3 (three) times daily.        . hydrALAZINE (APRESOLINE) 25 MG tablet Take 25 mg by mouth 3 (three) times daily.        Marland Kitchen HYDROcodone-acetaminophen (VICODIN) 5-500 MG per tablet Take 1 tablet by mouth 4 (four) times daily - after meals and at bedtime. PAIN      . insulin glargine (  LANTUS) 100 UNIT/ML injection Inject 30 Units into the skin every morning.       . isosorbide mononitrate (IMDUR) 60 MG 24 hr tablet Take 60 mg by mouth at bedtime.       Marland Kitchen linagliptin (TRADJENTA) 5 MG TABS tablet Take 5 mg by mouth daily.      . magnesium oxide (MAG-OX) 400 MG tablet Take 400 mg by mouth 2 (two) times daily.      . metolazone (ZAROXOLYN) 5 MG tablet Take 5 mg by mouth daily.      Marland Kitchen omeprazole (PRILOSEC) 20 MG capsule Take 40 mg by mouth 2 (two) times daily.      . potassium chloride (K-DUR) 10 MEQ tablet Take 10 mEq by mouth daily.      . Tamsulosin HCl (FLOMAX) 0.4 MG CAPS Take 0.4 mg by mouth daily.      . Ticagrelor (BRILINTA) 90 MG TABS tablet Take 90 mg by mouth 2 (two) times daily.       . vitamin B-12 (CYANOCOBALAMIN) 250 MCG tablet Take 250 mcg by mouth daily.      . nitroGLYCERIN (NITROSTAT) 0.4 MG SL tablet Place 0.4 mg under the tongue every 5 (five) minutes as needed. For chest pain         Current medications: Current Facility-Administered Medications  Medication Dose Route Frequency Provider Last Rate Last Dose  . 0.9 %   sodium chloride infusion  250 mL Intravenous PRN Brooke O Edmisten, PA-C      . carvedilol (COREG) tablet 6.25 mg  6.25 mg Oral BID WC Brooke O Edmisten, PA-C      . colchicine tablet 0.3 mg  0.3 mg Oral Daily Brooke O Edmisten, PA-C      . DOBUTamine (DOBUTREX) infusion 4000 mcg/mL  2.5 mcg/kg/min Intravenous Continuous Brooke O Edmisten, PA-C      . docusate sodium (COLACE) capsule 100 mg  100 mg Oral BID Brooke O Edmisten, PA-C      . donepezil (ARICEPT) tablet 5 mg  5 mg Oral QHS Brooke O Edmisten, PA-C      . gabapentin (NEURONTIN) capsule 300 mg  300 mg Oral TID Brooke O Edmisten, PA-C      . hydrALAZINE (APRESOLINE) tablet 25 mg  25 mg Oral TID [employer], PA-C      . HYDROcodone-acetaminophen (NORCO/VICODIN) 5-325 MG per tablet 1 tablet  1 tablet Oral TID PC & HS Marinus Maw, MD      . insulin glargine (LANTUS) injection 30 Units  30 Units Subcutaneous q morning - 10a Brooke O Edmisten, PA-C      . isosorbide mononitrate (IMDUR) 24 hr tablet 60 mg  60 mg Oral QHS Brooke O Edmisten, PA-C      . linagliptin (TRADJENTA) tablet 5 mg  5 mg Oral Daily Brooke O Edmisten, PA-C      . mupirocin ointment (BACTROBAN) 2 %   Nasal BID Marinus Maw, MD      . mupirocin ointment (BACTROBAN) 2 %           . nitroGLYCERIN (NITROSTAT) SL tablet 0.4 mg  0.4 mg Sublingual Q5 min PRN Brooke O Edmisten, PA-C      . ondansetron (ZOFRAN) injection 4 mg  4 mg Intravenous Q6H PRN Brooke O Edmisten, PA-C      . pantoprazole (PROTONIX) EC tablet 40 mg  40 mg Oral Q1200 Brooke O Edmisten, PA-C      . sodium chloride 0.9 % injection  3 mL  3 mL Intravenous Q12H Brooke O Edmisten, PA-C      . sodium chloride 0.9 % injection 3 mL  3 mL Intravenous PRN Brooke O Edmisten, PA-C      . Tamsulosin HCl (FLOMAX) capsule 0.4 mg  0.4 mg Oral Daily Brooke O Edmisten, PA-C      . Ticagrelor (BRILINTA) tablet 90 mg  90 mg Oral BID Brooke O Edmisten, PA-C      . DISCONTD: 0.45 % sodium chloride infusion   Intravenous  Continuous Marinus Maw, MD      . DISCONTD: 0.9 %  sodium chloride infusion  250 mL Intravenous Continuous Marinus Maw, MD      . DISCONTD: acetaminophen (TYLENOL) tablet 650 mg  650 mg Oral Q4H PRN Brooke O Edmisten, PA-C      . DISCONTD: fenofibrate tablet 160 mg  160 mg Oral Daily Brooke O Edmisten, PA-C      . DISCONTD: gentamicin (GARAMYCIN) 80 mg in sodium chloride irrigation 0.9 % 500 mL irrigation  80 mg Irrigation On Call Marinus Maw, MD      . DISCONTD: HYDROcodone-acetaminophen (VICODIN) 5-500 MG per tablet 1 tablet  1 tablet Oral TID PC & HS Brooke O Edmisten, PA-C      . DISCONTD: sodium chloride 0.9 % injection 3 mL  3 mL Intravenous Q12H Marinus Maw, MD      . DISCONTD: sodium chloride 0.9 % injection 3 mL  3 mL Intravenous PRN Marinus Maw, MD      . DISCONTD: vancomycin (VANCOCIN) 1,500 mg in sodium chloride 0.9 % 500 mL IVPB  1,500 mg Intravenous On Call Marinus Maw, MD          Allergies: Allergies  Allergen Reactions  . Cephalosporins Nausea Only  . Morphine And Related Other (See Comments)    hypotension  . Penicillins Other (See Comments)    Reaction as a child -unknown      Past Medical History: Past Medical History  Diagnosis Date  . Dyslipidemia     takes Tricor daily  . Stroke   . Asthma   . PONV (postoperative nausea and vomiting)   . Atrial fibrillation     takes Amiodarone daily  . Coronary artery disease   . Peripheral vascular disease   . Myocardial infarction     several;but doesn't recall the last time  . CHF (congestive heart failure)     was on Lasix but has been off for a couple of weeks  . Shortness of breath     with exertion  . Pneumonia     history of   . Peripheral neuropathy   . Arthritis     left foot and left leg  . Gout     takes Colchicine daily  . Scratches     all over legs from dog   . H/O hiatal hernia   . GERD (gastroesophageal reflux disease)     takes Omeprazole daily  . Hepatitis     hx  of;6-53yrs ago;doesn't remember which one  . Chronic constipation   . Chronic diarrhea   . Hemorrhoids   . Urinary frequency   . Urinary urgency   . Enlarged prostate     takes Flomax daily  . History of bladder infections   . Chronic kidney disease, stage 3, mod decreased GFR     pt states NO DIALYSIS  . Diabetes mellitus     novolog daily and  lantus 20units nightly  . Depression     takes Aricept nightly  . Insomnia   . Hypertension     takes Carvedilol and Imdur daily and Hydralazine daily     Past Surgical History: Past Surgical History  Procedure Date  . Internal pacemaker     then removedp;2nd placed along with defibrillator   . Toe amputation     2nd and 4th toe on left foot  . Cardiac catheterization 07/05/10/12  . Colonoscopy   . Esophagogastroduodenoscopy   . Pacemaker lead removal 06/26/2012    Procedure: PACEMAKER LEAD REMOVAL;  Surgeon: Marinus Maw, MD;  Location: [facility] OR;  Service: Cardiovascular;  Laterality: N/A;     Family History: Family History  Problem Relation Age of Onset  . Hypertension Mother   . Diabetes Mother   . Diabetes Brother   . Hypertension Brother      Social History: History   Social History  . Marital Status: Divorced    Spouse Name: N/A    Number of Children: N/A  . Years of Education: N/A   Occupational History  . Not on file.   Social History Main Topics  . Smoking status: Never Smoker   . Smokeless tobacco: Never Used  . Alcohol Use: No  . Drug Use: No  . Sexually Active: Not Currently   Other Topics Concern  . Not on file   Social History Narrative  . No narrative on file     Review of Systems: As per HPI  Vital Signs: Blood pressure 101/62, pulse 59, temperature 97.9 F (36.6 C), temperature source Oral, resp. rate 20, height 5' 9.5" (1.765 m), weight 241 lb (109.317 kg), SpO2 93.00%.  Weight trends: Filed Weights   08/21/12 0733  Weight: 241 lb (109.317 kg)    Physical Exam: General: Vital  signs reviewed and noted. Appears older than stated age, obese body habitus, alert and oriented x 4, pleasant and conversant  Head: Normocephalic, atraumatic.  Eyes: PERRL, EOMI, No signs of anemia or jaundince.  Nose: Mucous membranes moist, not inflammed, nonerythematous.  Throat: Oropharynx nonerythematous, no exudate appreciated.   Neck: JVD 5 cm  Lungs:  Poor respiratory effort and becomes dyspneic with slightest movement in bed, no crackles on exam.  Heart: RRR. 2/6 systolic murmur  Abdomen:  Extremely distended with fluid wave, non tender  Extremities: 3+ tibial edema from thighs to ankles bilaterally; left ankle deformit   Neurologic: A&O x 2, cannot recall date; myoclonic jerks   Skin: Stage II sacral decubitus ulcer     Lab results: Basic Metabolic Panel:  Lab 08/21/12 7829  NA 132*  K 3.8  CL 89*  CO2 29  GLUCOSE 227*  BUN 85*  CREATININE 3.69*  CALCIUM 9.2  MG --  PHOS --    Liver Function Tests:  Lab 08/21/12 0815  AST 35  ALT 17  ALKPHOS 80  BILITOT 1.3*  PROT 7.1  ALBUMIN 3.0*   No results found for this basename: LIPASE:3,AMYLASE:3 in the last 168 hours No results found for this basename: AMMONIA:3 in the last 168 hours  CBC:  Lab 08/21/12 1349 08/21/12 0815  WBC 3.8* 4.3  NEUTROABS 3.0 --  HGB 9.8* 10.1*  HCT 31.1* 31.2*  MCV 86.4 85.7  PLT 176 196    Cardiac Enzymes: No results found for this basename: CKTOTAL:5,CKMB:5,CKMBINDEX:5,TROPONINI:5 in the last 168 hours  BNP: No components found with this basename: POCBNP:3  CBG:  Lab 08/21/12 0741  GLUCAP 228*  Microbiology: Results for orders placed during the hospital encounter of 08/21/12  SURGICAL PCR SCREEN     Status: Abnormal   Collection Time   08/21/12  8:14 AM      Component Value Range Status Comment   MRSA, PCR POSITIVE (*) NEGATIVE Final    Staphylococcus aureus POSITIVE (*) NEGATIVE Final     Coagulation Studies:  Basename 08/21/12 1349  LABPROT 16.3*  INR  1.34    Urinalysis: No results found for this basename: COLORURINE:2,APPERANCEUR:2,LABSPEC:2,PHURINE:2,GLUCOSEU:2,HGBUR:2,BILIRUBINUR:2,KETONESUR:2,PROTEINUR:2,UROBILINOGEN:2,NITRITE:2,LEUKOCYTESUR:2 in the last 72 hours    Imaging:  No results found.   Assessment & Plan: Pt is a 56 y.o. yo male with CHF HYHA Class III, Stage III-IV CKD, cirrhosis, and prostatic hypertrophy who presented for placement of an ICD, but the procedure was not performed secondary to worsening renal status.    Acute on Chronic Kidney Injury: The kidney disease is borderline stage IV and likely secondary to long standing diabetes and cardiac disease. Baseline Creatinine is close to 2 and GFR in the 30s. At presentation his Cr was 3.69, BUN 85, and GFR 20. He has numerous diseases pre-disposing him to AKI including CHF, gout and colchicine, and BPH. He could also be developing hepatorenal syndrome since he has cirrhosis. He recently has urinary retention during the last hospitalization, so that needs to be ruled out first. He could also have ATN from decreased renal perfusion secondary to CHF.  - Place Foley catheter - Agree with urinalysis and ultrasound; 24 hours urine protein not necessary at this time; also obtain urine sodium and creatinine - Assess baseline labs with PTH, phos and mag - strict I/O - daily renal panel - renal diet   Volume Status: He appears very volume overloaded. This could be from cirrhosis, CHF and renal failure. Regardless, he needs to have fluid drained.  - No extra IV fluid - no diuresis until renal ultrasound and urinalysis returned  - Consider abdominal ultrasound for evaluation of ascites  Anemia: Hemoglobin 9.8 at admission, which is close to his previous measurements, but it was 12.7 in April.  - Check anemia panel  - Consider Aranesp chronic  Hyponatremia, Hypervolemia: Corrected is 133 at admission Likley secondary to dilution - continue to trend with diuresis  Liver vs  Cardiac ^ ADH  Altered Mental Status and Tremor: DDX include encephalopathy or excess Gabapentin - order ammonia level to assess for hepatic encephalopathy - d/c Gabapentin since renally cleared and patient with tremor and poor recall    Si Raider. Clinton Sawyer, MD I have seen and examined this patient and agree with the plan of care seen and eval.  Very concerned.  Clearly ^^ Cr and vol xs.  R/O obstruction but worrisome for worsened cardiac status, or failing liver.  Will eval as this may be a terminal event .  Zackry Deines L 08/21/2012, 5:19 PM

## 2012-08-21 NOTE — Progress Notes (Signed)
Utilization review completed.  

## 2012-08-21 NOTE — Progress Notes (Signed)
RN and NT attempted to insert foley catheter 16 fr and then 14 fr unsuccessfully.  MD notified. New orders received to insert coude catheter. Efraim Kaufmann

## 2012-08-22 ENCOUNTER — Inpatient Hospital Stay (HOSPITAL_COMMUNITY): Payer: Medicare Other

## 2012-08-22 LAB — URINE MICROSCOPIC-ADD ON

## 2012-08-22 LAB — URINALYSIS, ROUTINE W REFLEX MICROSCOPIC
Glucose, UA: NEGATIVE mg/dL
Ketones, ur: NEGATIVE mg/dL
Protein, ur: NEGATIVE mg/dL

## 2012-08-22 LAB — COMPREHENSIVE METABOLIC PANEL
ALT: 16 U/L (ref 0–53)
AST: 37 U/L (ref 0–37)
CO2: 30 mEq/L (ref 19–32)
Chloride: 91 mEq/L — ABNORMAL LOW (ref 96–112)
GFR calc non Af Amer: 18 mL/min — ABNORMAL LOW (ref >90)
Sodium: 134 mEq/L — ABNORMAL LOW (ref 135–145)
Total Bilirubin: 1.4 mg/dL — ABNORMAL HIGH (ref 0.3–1.2)

## 2012-08-22 LAB — PARATHYROID HORMONE, INTACT (NO CA): PTH: 33.1 pg/mL (ref 14.0–72.0)

## 2012-08-22 LAB — CREATININE, URINE, RANDOM: Creatinine, Urine: 74.58 mg/dL

## 2012-08-22 LAB — GLUCOSE, CAPILLARY: Glucose-Capillary: 258 mg/dL — ABNORMAL HIGH (ref 70–99)

## 2012-08-22 LAB — VITAMIN B12: Vitamin B-12: 1221 pg/mL — ABNORMAL HIGH (ref 211–911)

## 2012-08-22 MED ORDER — LACTULOSE 10 GM/15ML PO SOLN
20.0000 g | Freq: Two times a day (BID) | ORAL | Status: DC
  Administered 2012-08-22 – 2012-08-25 (×7): 20 g via ORAL
  Filled 2012-08-22 (×8): qty 30

## 2012-08-22 MED ORDER — INSULIN ASPART 100 UNIT/ML ~~LOC~~ SOLN
0.0000 [IU] | Freq: Three times a day (TID) | SUBCUTANEOUS | Status: DC
  Administered 2012-08-24 – 2012-08-25 (×3): 3 [IU] via SUBCUTANEOUS
  Administered 2012-08-25: 8 [IU] via SUBCUTANEOUS

## 2012-08-22 MED ORDER — CHLORHEXIDINE GLUCONATE CLOTH 2 % EX PADS
6.0000 | MEDICATED_PAD | Freq: Every day | CUTANEOUS | Status: DC
  Administered 2012-08-22 – 2012-08-25 (×4): 6 via TOPICAL

## 2012-08-22 MED ORDER — SODIUM CHLORIDE 0.9 % IV SOLN
1020.0000 mg | Freq: Once | INTRAVENOUS | Status: AC
  Administered 2012-08-22: 1020 mg via INTRAVENOUS
  Filled 2012-08-22: qty 34

## 2012-08-22 MED ORDER — FUROSEMIDE 80 MG PO TABS
160.0000 mg | ORAL_TABLET | Freq: Three times a day (TID) | ORAL | Status: DC
  Administered 2012-08-22 – 2012-08-25 (×8): 160 mg via ORAL
  Filled 2012-08-22 (×13): qty 2

## 2012-08-22 MED ORDER — COLLAGENASE 250 UNIT/GM EX OINT
TOPICAL_OINTMENT | Freq: Every day | CUTANEOUS | Status: DC
  Administered 2012-08-22: 1 via TOPICAL
  Administered 2012-08-23 – 2012-08-25 (×3): via TOPICAL
  Filled 2012-08-22: qty 30

## 2012-08-22 MED ORDER — POTASSIUM CHLORIDE CRYS ER 20 MEQ PO TBCR
40.0000 meq | EXTENDED_RELEASE_TABLET | Freq: Two times a day (BID) | ORAL | Status: DC
  Administered 2012-08-22 – 2012-08-25 (×7): 40 meq via ORAL
  Filled 2012-08-22 (×8): qty 2

## 2012-08-22 NOTE — Progress Notes (Signed)
[facility] Progress Note    Subjective:   Slept well overnight and would like to have procedure this AM; Still has shakes    Objective:   BP 125/74  Pulse 65  Temp 98.2 F (36.8 C) (Oral)  Resp 17  Ht 5' 9.5" (1.765 m)  Wt 237 lb 11.2 oz (107.82 kg)  BMI 34.60 kg/m2  SpO2 92%  Intake/Output Summary (Last 24 hours) at 08/22/12 1014 Last data filed at 08/22/12 0500  Gross per 24 hour  Intake      0 ml  Output   1300 ml  Net  -1300 ml   Weight change:   Physical Exam: Gen: lying in bed relaxed, conversant, NAD, alert and oriented to place and situation but not time CVS: RRR, 3/6 systolic murmur Resp: mild bilateral lower rales ZOX:WRUE distended with fluid wave Ext: 3+ pitting edema of, buttocks, and sacrum Neuro: myoclonic tremor   Imaging: US Renal  08/21/2012  *RADIOLOGY REPORT*  Clinical Data: 56 year old male with acute renal failure.  Diabetes hypertension and hepatitis.  RENAL/URINARY TRACT ULTRASOUND COMPLETE  Comparison:  CT abdomen and pelvis 08/19/2012.  Findings:  Right Kidney:  No hydronephrosis.  Renal length 12.5 cm.  Increased cortical echogenicity compared to the liver.  Left Kidney:  No hydronephrosis.  Renal length 11.9 cm.  Increased cortical echogenicity.  Bladder:  Decompressed which may account for of wall thickening up to 12 mm.  Other findings:  Scattered small volume ascites.  IMPRESSION: 1.  Chronic medical renal disease.  No hydronephrosis. 2.  Bladder wall thickening, presumably related to the decompressed state of the bladder.  UTI not excluded. 3.  Scattered small volume ascites.   Original Report Authenticated By: Harley Hallmark, M.D.     Labs: BMET  Lab 08/22/12 0628 08/21/12 1349 08/21/12 0815  NA 134* 131* 132*  K 3.5 3.6 3.8  CL 91* 89* 89*  CO2 30 28 29   GLUCOSE 143* 236* 227*  BUN 87* 88* 85*  CREATININE 3.57* 3.74* 3.69*  ALB -- -- --  CALCIUM 9.3 9.2 9.2  PHOS -- -- --   CBC  Lab 08/21/12 1349 08/21/12  0815  WBC 3.8* 4.3  NEUTROABS 3.0 --  HGB 9.8* 10.1*  HCT 31.1* 31.2*  MCV 86.4 85.7  PLT 176 196    Medications:      . Chlorhexidine Gluconate Cloth  6 each Topical Q0600  . colchicine  0.3 mg Oral Daily  . docusate sodium  100 mg Oral BID  . donepezil  5 mg Oral QHS  . HYDROcodone-acetaminophen  1 tablet Oral TID PC & HS  . insulin glargine  30 Units Subcutaneous q morning - 10a  . isosorbide mononitrate  60 mg Oral QHS  . linagliptin  5 mg Oral Daily  . mupirocin ointment   Nasal BID  . mupirocin ointment      . pantoprazole  40 mg Oral Q1200  . sodium chloride  3 mL Intravenous Q12H  . Tamsulosin HCl  0.4 mg Oral Daily  . DISCONTD: carvedilol  6.25 mg Oral BID WC  . DISCONTD: fenofibrate  160 mg Oral Daily  . DISCONTD: gabapentin  300 mg Oral TID  . DISCONTD: gentamicin irrigation  80 mg Irrigation On Call  . DISCONTD: hydrALAZINE  25 mg Oral TID  . DISCONTD: HYDROcodone-acetaminophen  1 tablet Oral TID PC & HS  . DISCONTD: sodium chloride  3 mL Intravenous Q12H  . DISCONTD: Ticagrelor  90 mg Oral  BID  . DISCONTD: vancomycin  1,500 mg Intravenous On Call     Assessment/ Plan:   Pt is a 56 y.o. yo male with CHF HYHA Class III, Stage III-IV CKD, cirrhosis, and prostatic hypertrophy who presented for placement of an ICD, but the procedure was not performed secondary to worsening renal status.   CV: Patient needs replacement iof his BiV ICD placed but needs improved renal function and volume removal - Dr. Ladona Ridgel increased dobutamine today  - reassess candidacy for procedure each day   Acute on Chronic Kidney Injury: The kidney disease is borderline stage IV and likely secondary to long standing diabetes and cardiac disease. Baseline Creatinine is close to 2 and GFR in the 30s. At presentation his Cr was 3.69, BUN 85, and GFR 20. He has numerous diseases pre-disposing him to AKI including CHF, gout and colchicine, and BPH. He could also be developing cardiorenal syndrome  since his heart has weakened without the BiVICD, or he could have hepatorenal syndrome since he has cirrhosis. He recently has urinary retention during the last hospitalization, so that needs to be ruled out first. He could also have ATN from decreased renal perfusion secondary to CHF.  - Foley placed with normal output and normal ultrasound no obstructive pattern ruled out - Urine lytes still pending - Urinalysis with possible UTI, so will send for culture, but not evidence of nephritis   Volume Status: He appears very volume overloaded. This could be from cirrhosis, CHF and renal failure.  - start aggressive diuresis today with Lasix 160 mg TID  - give K+ since aggressive loop diuretic use    Anemia: Hemoglobin 9.8 at admission, which is close to his previous measurements, but it was 12.7 in April. Fe 41, Sat ratio 10. - start aranesp - Ferreheme IV today x 1   Electrolytes:  Corrected is 133 at admission Likley secondary to dilution  - continue to trend with diuresis Liver vs Cardiac ^ ADH   Enchephalopathy: DDX include encephalopathy or excess Gabapentin; D/c'd gabapentin; Ammonia 83 at admission:  - give lactulose for hyperammonemia - d/c aricept   Si Raider. Clinton Sawyer, MD  08/22/2012, 10:14 AM   I have seen and examined this patient and agree with the plan of care  Seen and eval.  Discussed with. Dr. Ladona Ridgel.  Vol xs, diurese.  On inotrope.  Will start lactulose for ^ NH3.   Marland Kitchen  Emine Lopata L 08/22/2012, 12:38 PM

## 2012-08-22 NOTE — Care Management Note (Signed)
    Page 1 of 1   08/26/2012     10:28:26 AM   CARE MANAGEMENT NOTE 08/26/2012  Patient:  [name], [name]   Account Number:  [id number]  Date Initiated:  08/22/2012  Documentation initiated by:  Donn Pierini  Subjective/Objective Assessment:   Pt admitted with CHF, was for a reimplant of his ICD- that is on hold for now     Action/Plan:   [facility], PT eval   Anticipated DC Date:  08/26/2012   Anticipated DC Plan:  SKILLED NURSING FACILITY  In-house referral  Clinical Social Worker      DC Planning Services  CM consult      Choice offered to / List presented to:             Status of service:  Completed, signed off Medicare Important Message given?   (If response is "NO", the following Medicare IM given date fields will be blank) Date Medicare IM given:   Date Additional Medicare IM given:    Discharge Disposition:  SKILLED NURSING FACILITY  Per UR Regulation:  Reviewed for med. necessity/level of care/duration of stay  If discussed at Long Length of Stay Meetings, dates discussed:    Comments:  08/22/12- 1420- Donn Pierini RN, BSN [phone] Received referral for HF HH screen- PTA pt was living at [facility]- currently on Dobutamine drip per cards hopeful to stabilize for ICD reinsertion. per PT notes recommendations are for SNF at discharge. CSW following for placement needs. At this time no HH needs for CHF as pt will be discharging to SNF.

## 2012-08-22 NOTE — Clinical Social Work Placement (Addendum)
    Clinical Social Work Department CLINICAL SOCIAL WORK PLACEMENT NOTE 08/22/2012  Patient:  [name], [name]  Account Number:  [id number] Admit date:  08/21/2012  Clinical Social Worker:  Doree Albee  Date/time:  08/22/2012 04:42 PM  Clinical Social Work is seeking post-discharge placement for this patient at the following level of care:   SKILLED NURSING   (*CSW will update this form in Epic as items are completed)   08/22/2012  Patient/family provided with [facility] Department of Clinical Social Work's list of facilities offering this level of care within the geographic area requested by the patient (or if unable, by the patient's family).  08/22/2012  Patient/family informed of their freedom to choose among providers that offer the needed level of care, that participate in Medicare, Medicaid or managed care program needed by the patient, have an available bed and are willing to accept the patient.  08/22/2012  Patient/family informed of [facility]' ownership interest in [facility], as well as of the fact that they are under no obligation to receive care at this facility.  PASARR submitted to EDS on 08/22/2012 PASARR number received from EDS on   FL2 transmitted to all facilities in geographic area requested by pt/family on  08/22/2012 FL2 transmitted to all facilities within larger geographic area on   Patient informed that his/her managed care company has contracts with or will negotiate with  certain facilities, including the following:     Patient/family informed of bed offers received:   Patient chooses bed at  Physician recommends and patient chooses bed at    Patient to be transferred to: [facility] 08/25/12 Patient to be transferred to facility by [facility]  The following physician request were entered in Epic:   Additional Comments:

## 2012-08-22 NOTE — Consult Note (Addendum)
WOC consult Note Reason for Consult: Consult requested for sacral and scrotal wounds. Wound type: unstageable  Sacrum 2x3cm, 95% yellow slough 5% red  Pressure Ulcer POA: Yes Drainage Mod yellow, with strong odor  Periwound: intact Scrotum; 0.3x0.3cm partial thickness red without odor or drainage. Surround skin red and macerated. Appearance consistent  with candita   Dressing procedure/placement/frequency: Santyl ointment to debride dead tissue. Air mattress to reduce pressure barrier cream to promote healing to scrotum   Pt has multiple scratches and red lesions to bilat legs.   RECOMMEND WOUND CULTURE TO R/O POSSIBLE MRSA.  Cammie Mcgee, RN, MSN, [employer]  [phone]

## 2012-08-22 NOTE — Clinical Social Work Psychosocial (Signed)
     Clinical Social Work Department BRIEF PSYCHOSOCIAL ASSESSMENT 08/22/2012  Patient:  [name], [name]     Account Number:  [id number]     Admit date:  08/21/2012  Clinical Social Worker:  Doree Albee  Date/Time:  08/22/2012 04:36 PM  Referred by:  RN  Date Referred:  08/22/2012 Referred for  SNF Placement   Other Referral:   Interview type:  Patient Other interview type:    PSYCHOSOCIAL DATA Living Status:  FACILITY Admitted from facility:  [facility]  Level of care:  Assisted Living Primary support name:  Daxen Lanum Primary support relationship to patient:  SIBLING Degree of support available:   strong    CURRENT CONCERNS Current Concerns  Post-Acute Placement   Other Concerns:    SOCIAL WORK ASSESSMENT / PLAN CSW met with pt at bedside to complete psychosocial assessment and assist with pt dc plans.    Pt shared that he is currently a resident at [facility]. Pt shared that he is aware of recommendaitons for skilled nursing for rehab. Pt requested [facility]. Pt shared that he has been to [facility] and rehab on several occasions and is pleased with the care he has reiceved in the past.    CSW will initiate snf search. Please see placement note for progress.   Assessment/plan status:  Psychosocial Support/Ongoing Assessment of Needs Other assessment/ plan:   Information/referral to community resources:   skilled nursing facility list    PATIENTS/FAMILYS RESPONSE TO PLAN OF CARE: Pt thanked csw for concern and assitance. Pt is motivated to [employer] to [facility].

## 2012-08-22 NOTE — Progress Notes (Signed)
Patient ID: [name], male   DOB: [birth year], 56 y.o.   MRN: [id number] Subjective:  "I want my ICD put back in"  Objective:  Vital Signs in the last 24 hours: Temp:  [97.7 F (36.5 C)-98.2 F (36.8 C)] 98.2 F (36.8 C) (09/26 0545) Pulse Rate:  [59-65] 65  (09/26 0545) Resp:  [17-20] 17  (09/26 0545) BP: (101-128)/(62-80) 125/74 mmHg (09/26 0545) SpO2:  [90 %-93 %] 92 % (09/26 0545) Weight:  [237 lb 11.2 oz (107.82 kg)] 237 lb 11.2 oz (107.82 kg) (09/26 0545)  Intake/Output from previous day: 09/25 0701 - 09/26 0700 In: -  Out: 1300 [Urine:1300] Intake/Output from this shift:    Physical Exam: Well appearing NAD HEENT: Unremarkable Neck:  9 cm JVD, no thyromegally Lungs:  Rales in the bases bilaterally HEART:  Regular rate rhythm, no murmurs, no rubs, no clicks Abd:  obese, positive bowel sounds, no organomegally, no rebound, no guarding Ext:  2 plus pulses, 2+ edema, no cyanosis, no clubbing Skin:  No rashes no nodules Neuro:  CN II through XII intact, motor grossly intact  Lab Results:  Basename 08/21/12 1349 08/21/12 0815  WBC 3.8* 4.3  HGB 9.8* 10.1*  PLT 176 196    Basename 08/22/12 0628 08/21/12 1349  NA 134* 131*  K 3.5 3.6  CL 91* 89*  CO2 30 28  GLUCOSE 143* 236*  BUN 87* 88*  CREATININE 3.57* 3.74*   No results found for this basename: TROPONINI:2,CK,MB:2 in the last 72 hours Hepatic Function Panel  Basename 08/22/12 0628 08/21/12 0815  PROT 7.2 --  ALBUMIN 3.0* --  AST 37 --  ALT 16 --  ALKPHOS 73 --  BILITOT 1.4* --  BILIDIR -- 0.8*  IBILI -- 0.5   No results found for this basename: CHOL in the last 72 hours No results found for this basename: PROTIME in the last 72 hours  Imaging: US Renal  08/21/2012  *RADIOLOGY REPORT*  Clinical Data: 56 year old male with acute renal failure.  Diabetes hypertension and hepatitis.  RENAL/URINARY TRACT ULTRASOUND COMPLETE  Comparison:  CT abdomen and pelvis 08/19/2012.  Findings:  Right  Kidney:  No hydronephrosis.  Renal length 12.5 cm.  Increased cortical echogenicity compared to the liver.  Left Kidney:  No hydronephrosis.  Renal length 11.9 cm.  Increased cortical echogenicity.  Bladder:  Decompressed which may account for of wall thickening up to 12 mm.  Other findings:  Scattered small volume ascites.  IMPRESSION: 1.  Chronic medical renal disease.  No hydronephrosis. 2.  Bladder wall thickening, presumably related to the decompressed state of the bladder.  UTI not excluded. 3.  Scattered small volume ascites.   Original Report Authenticated By: Harley Hallmark, M.D.     Cardiac Studies: Tele - nsr Assessment/Plan:  1. Acute on chronic systolic CHF 2. 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 Cirrhosis with normal TBili and transaminases 4. LBBB Rec: As he has had a significant decline since his BiV ICD was removed, it is my hope that he will improve enough for re-insertion. However, if he does not improve with regard to diuresis and renal function with positive inotropes, then I would not recommend device reinsertion. Data suggests he would not benefit at this point. Appreciate renal service input and I will defer rec's in diuresis to their judgement. Note no evidence of obstruction. I have increased dobutamine to 5 mics in an attempt to try and improve cardiac output and renal perfusion.  LOS: 1 day  Buel Ream.D. 08/22/2012, 8:37 AM

## 2012-08-23 ENCOUNTER — Encounter (HOSPITAL_COMMUNITY)
Admission: RE | Disposition: A | Discharge status: Skilled Nursing Facility | Payer: Self-pay | Source: Ambulatory Visit | Attending: Internal Medicine

## 2012-08-23 DIAGNOSIS — I2589 Other forms of chronic ischemic heart disease: Secondary | ICD-10-CM

## 2012-08-23 DIAGNOSIS — I509 Heart failure, unspecified: Secondary | ICD-10-CM

## 2012-08-23 HISTORY — PX: BI-VENTRICULAR IMPLANTABLE CARDIOVERTER DEFIBRILLATOR: SHX5459

## 2012-08-23 LAB — CBC
HCT: 31.1 % — ABNORMAL LOW (ref 39.0–52.0)
Hemoglobin: 10 g/dL — ABNORMAL LOW (ref 13.0–17.0)
MCH: 27.4 pg (ref 26.0–34.0)
MCHC: 32.2 g/dL (ref 30.0–36.0)
MCV: 86.3 fL (ref 78.0–100.0)
Platelets: 181 10*3/uL (ref 150–400)
RBC: 3.58 MIL/uL — ABNORMAL LOW (ref 4.22–5.81)
RBC: 3.6 MIL/uL — ABNORMAL LOW (ref 4.22–5.81)
RDW: 17.3 % — ABNORMAL HIGH (ref 11.5–15.5)

## 2012-08-23 LAB — GLUCOSE, CAPILLARY
Glucose-Capillary: 120 mg/dL — ABNORMAL HIGH (ref 70–99)
Glucose-Capillary: 162 mg/dL — ABNORMAL HIGH (ref 70–99)
Glucose-Capillary: 83 mg/dL (ref 70–99)

## 2012-08-23 LAB — RENAL FUNCTION PANEL
Albumin: 3.2 g/dL — ABNORMAL LOW (ref 3.5–5.2)
BUN: 71 mg/dL — ABNORMAL HIGH (ref 6–23)
Creatinine, Ser: 2.8 mg/dL — ABNORMAL HIGH (ref 0.50–1.35)
Phosphorus: 3.4 mg/dL (ref 2.3–4.6)

## 2012-08-23 LAB — BASIC METABOLIC PANEL
BUN: 68 mg/dL — ABNORMAL HIGH (ref 6–23)
Chloride: 92 mEq/L — ABNORMAL LOW (ref 96–112)
GFR calc Af Amer: 29 mL/min — ABNORMAL LOW (ref >90)
Potassium: 3.4 mEq/L — ABNORMAL LOW (ref 3.5–5.1)
Sodium: 136 mEq/L (ref 135–145)

## 2012-08-23 SURGERY — BI-VENTRICULAR IMPLANTABLE CARDIOVERTER DEFIBRILLATOR  (CRT-D)

## 2012-08-23 MED ORDER — SODIUM CHLORIDE 0.9 % IJ SOLN: 3.0000 mL | INTRAMUSCULAR | Status: DC | PRN

## 2012-08-23 MED ORDER — VANCOMYCIN HCL 1000 MG IV SOLR
1500.0000 mg | INTRAVENOUS | Status: DC
  Filled 2012-08-23: qty 1500

## 2012-08-23 MED ORDER — SODIUM CHLORIDE 0.9 % IV SOLN: 250.0000 mL | INTRAVENOUS | Status: DC

## 2012-08-23 MED ORDER — VANCOMYCIN HCL 1000 MG IV SOLR
2000.0000 mg | INTRAVENOUS | Status: AC
  Administered 2012-08-24: 2000 mg via INTRAVENOUS
  Filled 2012-08-23 (×2): qty 2000

## 2012-08-23 MED ORDER — ONDANSETRON HCL 4 MG/2ML IJ SOLN: 4.0000 mg | Freq: Four times a day (QID) | INTRAMUSCULAR | Status: DC | PRN

## 2012-08-23 MED ORDER — LIDOCAINE HCL (PF) 1 % IJ SOLN
INTRAMUSCULAR | Status: AC
  Filled 2012-08-23: qty 60

## 2012-08-23 MED ORDER — SODIUM CHLORIDE 0.9 % IJ SOLN: 3.0000 mL | Freq: Two times a day (BID) | INTRAMUSCULAR | Status: DC

## 2012-08-23 MED ORDER — CHLORHEXIDINE GLUCONATE 4 % EX LIQD: 60.0000 mL | Freq: Once | CUTANEOUS | Status: DC

## 2012-08-23 MED ORDER — SODIUM CHLORIDE 0.9 % IR SOLN
80.0000 mg | Status: DC
  Filled 2012-08-23: qty 2

## 2012-08-23 MED ORDER — MIDAZOLAM HCL 5 MG/5ML IJ SOLN
INTRAMUSCULAR | Status: AC
  Filled 2012-08-23: qty 5

## 2012-08-23 MED ORDER — FENTANYL CITRATE 0.05 MG/ML IJ SOLN
INTRAMUSCULAR | Status: AC
  Filled 2012-08-23: qty 2

## 2012-08-23 MED ORDER — ACETAMINOPHEN 325 MG PO TABS: 325.0000 mg | ORAL_TABLET | ORAL | Status: DC | PRN

## 2012-08-23 NOTE — Op Note (Signed)
BiV ICD implanted via the right subclavian vein without immediate complication. 15 cc of contrast utilized. No DFT testing caried out secondary to the patient's advanced CHF.B#[id number].

## 2012-08-23 NOTE — Progress Notes (Signed)
Patient ID: [name], male   DOB: 04/23/1956, 56 y.o.   MRN: [id number] Subjective:  No chest pain, dyspnea improved.  Objective:  Vital Signs in the last 24 hours: Temp:  [98.1 F (36.7 C)-98.3 F (36.8 C)] 98.1 F (36.7 C) (09/27 0500) Pulse Rate:  [71-75] 75  (09/27 0500) Resp:  [20] 20  (09/26 1431) BP: (114-151)/(65-82) 146/77 mmHg (09/27 0500) SpO2:  [93 %-97 %] 93 % (09/27 0500) Weight:  [236 lb 12.8 oz (107.412 kg)-238 lb 8.6 oz (108.2 kg)] 238 lb 8.6 oz (108.2 kg) (09/27 0632)  Intake/Output from previous day: 09/26 0701 - 09/27 0700 In: 427.9 [I.V.:293.9; IV Piggyback:134] Out: 2750 [Urine:2750] Intake/Output from this shift:    Physical Exam: Chronically ill appearing NAD HEENT: Unremarkable Neck:  7 cm JVD, no thyromegally Lungs:  Rare basilar rales with no wheezes HEART:  Regular rate rhythm, no murmurs, no rubs, no clicks Abd:  soft, positive bowel sounds, no organomegally, no rebound, no guarding Ext:  2 plus pulses, 2+ edema, no cyanosis, no clubbing Skin:  No rashes no nodules Neuro:  CN II through XII intact, motor grossly intact  Lab Results:  Basename 08/23/12 0857 08/23/12 0640  WBC 4.5 5.0  HGB 10.0* 9.8*  PLT 157 181    Basename 08/23/12 0640 08/22/12 0628  NA 137 134*  K 3.4* 3.5  CL 94* 91*  CO2 33* 30  GLUCOSE 125* 143*  BUN 71* 87*  CREATININE 2.80* 3.57*   No results found for this basename: TROPONINI:2,CK,MB:2 in the last 72 hours Hepatic Function Panel  Basename 08/23/12 0640 08/22/12 0628 08/21/12 0815  PROT -- 7.2 --  ALBUMIN 3.2* -- --  AST -- 37 --  ALT -- 16 --  ALKPHOS -- 73 --  BILITOT -- 1.4* --  BILIDIR -- -- 0.8*  IBILI -- -- 0.5   No results found for this basename: CHOL in the last 72 hours No results found for this basename: PROTIME in the last 72 hours  Imaging: Dg Chest 2 View  08/22/2012  *RADIOLOGY REPORT*  Clinical Data: Dyspnea and CHF.  CHEST - 2 VIEW  Comparison: 08/15/2012  Findings: Lateral  view degraded by patient arm position.  Suspect mild hyperinflation. Midline trachea.  Mild cardiomegaly. Mediastinal contours otherwise within normal limits. On the lateral view, a small pleural effusion is suspected.  Favor right-sided.  No pneumothorax.  No congestive failure.  Clear lungs.  IMPRESSION: Cardiomegaly without congestive failure.  Suspicion of a small pleural effusion on the lateral. Given technique, difficult to localize; favor right-sided.   Original Report Authenticated By: KYLE D. TALBOT, M.D.    Us Renal  08/21/2012  *RADIOLOGY REPORT*  Clinical Data: 56-year-old male with acute renal failure.  Diabetes hypertension and hepatitis.  RENAL/URINARY TRACT ULTRASOUND COMPLETE  Comparison:  CT abdomen and pelvis 08/19/2012.  Findings:  Right Kidney:  No hydronephrosis.  Renal length 12.5 cm.  Increased cortical echogenicity compared to the liver.  Left Kidney:  No hydronephrosis.  Renal length 11.9 cm.  Increased cortical echogenicity.  Bladder:  Decompressed which may account for of wall thickening up to 12 mm.  Other findings:  Scattered small volume ascites.  IMPRESSION: 1.  Chronic medical renal disease.  No hydronephrosis. 2.  Bladder wall thickening, presumably related to the decompressed state of the bladder.  UTI not excluded. 3.  Scattered small volume ascites.   Original Report Authenticated By: H.LEE HALL III, M.D.     Cardiac Studies: Tele -   nsr Assessment/Plan:  1. Acute on chronic systolic CHF 2. Acute on chronic renal insufficiency 3. Cirrhosis with a normal TBili, transaminases but low albumin Rec: After much reflection and concern, will attempt to place BiV device today without contrast. He is not a great surgical candidate but he has declined markedly after removal of his BiV device 2 months ago. I will continue his inotropes and lasix around surgery. He understands the risks of the procedure. He has agree to HD if by chance he develops renal failure around the  procedure.  LOS: 2 days    Gregg TaylorM.D. 08/23/2012, 10:11 AM     

## 2012-08-23 NOTE — Progress Notes (Signed)
Physical Therapy Treatment Patient Details Name: [name] MRN: [id number] DOB: Aug 09, 1956 Today's Date: 08/23/2012 Time: 4098-1191 PT Time Calculation (min): 32 min  PT Assessment / Plan / Recommendation Comments on Treatment Session  Pt admitted with heart failure and awaiting planned defibrillator reimplantation. Pt progressing well with mobility and encouraged to continue HEP over the weekend and mobility with nursing pending post op orders. Will continue to follow.     Follow Up Recommendations       Barriers to Discharge        Equipment Recommendations       Recommendations for Other Services    Frequency     Plan Discharge plan remains appropriate;Frequency remains appropriate    Precautions / Restrictions Precautions Precautions: Fall   Pertinent Vitals/Pain 10/10 LLE pain chronic, repositioned and Rn aware    Mobility  Bed Mobility Bed Mobility: Supine to Sit;Sit to Supine Supine to Sit: 5: Supervision;With rails;HOB elevated (HOb 20 degrees) Sit to Supine: 4: Min assist;HOB flat;With rail Details for Bed Mobility Assistance: assist for bil LE to return to bed and increased time with use of rail to transfer out of bed Transfers Sit to Stand: 4: Min guard;From bed Stand to Sit: 4: Min guard;To bed Details for Transfer Assistance: cueing for hand placement and transfer Ambulation/Gait Ambulation/Gait Assistance: 4: Min guard Ambulation Distance (Feet): 22 Feet Assistive device: Rolling walker Ambulation/Gait Assistance Details: cueing for posture and pt with very slow gait with standing pause between each step Gait Pattern: Step-through pattern;Decreased stride length;Trunk flexed Gait velocity: very slow    Exercises General Exercises - Lower Extremity Long Arc Quad: AROM;Both;10 reps;Seated Hip ABduction/ADduction: AROM;Both;10 reps;Supine Hip Flexion/Marching: AROM;Both;10 reps;Seated   PT Diagnosis:    PT Problem List:   PT Treatment  Interventions:     PT Goals Acute Rehab PT Goals PT Goal: Supine/Side to Sit - Progress: Progressing toward goal PT Goal: Sit to Supine/Side - Progress: Progressing toward goal PT Goal: Sit to Stand - Progress: Progressing toward goal PT Goal: Stand to Sit - Progress: Progressing toward goal PT Goal: Ambulate - Progress: Progressing toward goal  Visit Information  Last PT Received On: 08/23/12 Assistance Needed: +1    Subjective Data  Subjective: I'm just ready to get this sx over with   Cognition  Overall Cognitive Status: Appears within functional limits for tasks assessed/performed Arousal/Alertness: Awake/alert Orientation Level: Appears intact for tasks assessed Behavior During Session: [facility] for tasks performed    Balance     End of Session PT - End of Session Equipment Utilized During Treatment: Gait belt Activity Tolerance: Patient limited by fatigue Patient left: in bed;with call bell/phone within reach Nurse Communication: Mobility status   GP     Toney Sang Beth 08/23/2012, 11:08 AM Delaney Meigs, PT [phone]

## 2012-08-23 NOTE — Progress Notes (Addendum)
At 23:55 responded to alarm from bedside monitor.  O2 sat reading 78% with patient on room air.  Patient was snoring deeply and was difficult to arouse but responded verbally.  With some stimulation, O2 sat trended to 87% and remained there x 2 minutes.  Placed nasal canula with O2 at 2L/min.  O2 sat quickly went up  to 96 - 99%.  Telemetry monitor set to continuous SPO2 monitoring.  Will continue to observe closely.  Addendum for 00:15 Patient continuing to have intermittent, brief apneic episodes with sats down to 78% range.  Quick return to baseline SPO2 on 2L O2 per nasal canula.  No previous medical history of OSA.  Discussed with Dr. Antoine Poche, on-call for Dr. Ladona Ridgel.  Instructed to note in PMR and continue to monitor.  No new orders at this time.  Continuing to monitor closely.

## 2012-08-23 NOTE — H&P (View-Only) (Signed)
Patient ID: [name], male   DOB: 1956/01/16, 56 y.o.   MRN: [id number] Subjective:  No chest pain, dyspnea improved.  Objective:  Vital Signs in the last 24 hours: Temp:  [98.1 F (36.7 C)-98.3 F (36.8 C)] 98.1 F (36.7 C) (09/27 0500) Pulse Rate:  [71-75] 75  (09/27 0500) Resp:  [20] 20  (09/26 1431) BP: (114-151)/(65-82) 146/77 mmHg (09/27 0500) SpO2:  [93 %-97 %] 93 % (09/27 0500) Weight:  [236 lb 12.8 oz (107.412 kg)-238 lb 8.6 oz (108.2 kg)] 238 lb 8.6 oz (108.2 kg) (09/27 7829)  Intake/Output from previous day: 09/26 0701 - 09/27 0700 In: 427.9 [I.V.:293.9; IV Piggyback:134] Out: 2750 [Urine:2750] Intake/Output from this shift:    Physical Exam: Chronically ill appearing NAD HEENT: Unremarkable Neck:  7 cm JVD, no thyromegally Lungs:  Rare basilar rales with no wheezes HEART:  Regular rate rhythm, no murmurs, no rubs, no clicks Abd:  soft, positive bowel sounds, no organomegally, no rebound, no guarding Ext:  2 plus pulses, 2+ edema, no cyanosis, no clubbing Skin:  No rashes no nodules Neuro:  CN II through XII intact, motor grossly intact  Lab Results:  Basename 08/23/12 0857 08/23/12 0640  WBC 4.5 5.0  HGB 10.0* 9.8*  PLT 157 181    Basename 08/23/12 0640 08/22/12 0628  NA 137 134*  K 3.4* 3.5  CL 94* 91*  CO2 33* 30  GLUCOSE 125* 143*  BUN 71* 87*  CREATININE 2.80* 3.57*   No results found for this basename: TROPONINI:2,CK,MB:2 in the last 72 hours Hepatic Function Panel  Basename 08/23/12 0640 08/22/12 0628 08/21/12 0815  PROT -- 7.2 --  ALBUMIN 3.2* -- --  AST -- 37 --  ALT -- 16 --  ALKPHOS -- 73 --  BILITOT -- 1.4* --  BILIDIR -- -- 0.8*  IBILI -- -- 0.5   No results found for this basename: CHOL in the last 72 hours No results found for this basename: PROTIME in the last 72 hours  Imaging: Dg Chest 2 View  08/22/2012  *RADIOLOGY REPORT*  Clinical Data: Dyspnea and CHF.  CHEST - 2 VIEW  Comparison: 08/15/2012  Findings: Lateral  view degraded by patient arm position.  Suspect mild hyperinflation. Midline trachea.  Mild cardiomegaly. Mediastinal contours otherwise within normal limits. On the lateral view, a small pleural effusion is suspected.  Favor right-sided.  No pneumothorax.  No congestive failure.  Clear lungs.  IMPRESSION: Cardiomegaly without congestive failure.  Suspicion of a small pleural effusion on the lateral. Given technique, difficult to localize; favor right-sided.   Original Report Authenticated By: Consuello Bossier, M.D.    US Renal  08/21/2012  *RADIOLOGY REPORT*  Clinical Data: 56 year old male with acute renal failure.  Diabetes hypertension and hepatitis.  RENAL/URINARY TRACT ULTRASOUND COMPLETE  Comparison:  CT abdomen and pelvis 08/19/2012.  Findings:  Right Kidney:  No hydronephrosis.  Renal length 12.5 cm.  Increased cortical echogenicity compared to the liver.  Left Kidney:  No hydronephrosis.  Renal length 11.9 cm.  Increased cortical echogenicity.  Bladder:  Decompressed which may account for of wall thickening up to 12 mm.  Other findings:  Scattered small volume ascites.  IMPRESSION: 1.  Chronic medical renal disease.  No hydronephrosis. 2.  Bladder wall thickening, presumably related to the decompressed state of the bladder.  UTI not excluded. 3.  Scattered small volume ascites.   Original Report Authenticated By: Harley Hallmark, M.D.     Cardiac Studies: Tele -  nsr Assessment/Plan:  1. Acute on chronic systolic CHF 2. Acute on chronic renal insufficiency 3. Cirrhosis with a normal TBili, transaminases but low albumin Rec: After much reflection and concern, will attempt to place BiV device today without contrast. He is not a great surgical candidate but he has declined markedly after removal of his BiV device 2 months ago. I will continue his inotropes and lasix around surgery. He understands the risks of the procedure. He has agree to HD if by chance he develops renal failure around the  procedure.  LOS: 2 days    Buel Ream.D. 08/23/2012, 10:11 AM

## 2012-08-23 NOTE — Progress Notes (Signed)
[facility] Progress Note    Subjective:   Patient frustrated this morning that he will not be going for a procedure     Objective:   BP 146/77  Pulse 75  Temp 98.1 F (36.7 C) (Oral)  Resp 20  Ht 5' 9.5" (1.765 m)  Wt 238 lb 8.6 oz (108.2 kg)  BMI 34.72 kg/m2  SpO2 93%  Intake/Output Summary (Last 24 hours) at 08/23/12 1000 Last data filed at 08/23/12 0200  Gross per 24 hour  Intake 427.91 ml  Output   2750 ml  Net -2322.09 ml   Weight change: -4 lb 3.2 oz (-1.905 kg)  Physical Exam: Gen: lying in bed relaxed, conversant, NAD, alert and oriented to place and situation but not time CVS: RRR, 3/6 systolic murmur Resp: mild bilateral lower rales AVW:UJWJ distended with fluid wave Ext: 3+ pitting edema of, buttocks, and sacrum Neuro: myoclonic tremor   Imaging: Dg Chest 2 View  08/22/2012  *RADIOLOGY REPORT*  Clinical Data: Dyspnea and CHF.  CHEST - 2 VIEW  Comparison: 08/15/2012  Findings: Lateral view degraded by patient arm position.  Suspect mild hyperinflation. Midline trachea.  Mild cardiomegaly. Mediastinal contours otherwise within normal limits. On the lateral view, a small pleural effusion is suspected.  Favor right-sided.  No pneumothorax.  No congestive failure.  Clear lungs.  IMPRESSION: Cardiomegaly without congestive failure.  Suspicion of a small pleural effusion on the lateral. Given technique, difficult to localize; favor right-sided.   Original Report Authenticated By: Consuello Bossier, M.D.    US Renal  08/21/2012  *RADIOLOGY REPORT*  Clinical Data: 56 year old male with acute renal failure.  Diabetes hypertension and hepatitis.  RENAL/URINARY TRACT ULTRASOUND COMPLETE  Comparison:  CT abdomen and pelvis 08/19/2012.  Findings:  Right Kidney:  No hydronephrosis.  Renal length 12.5 cm.  Increased cortical echogenicity compared to the liver.  Left Kidney:  No hydronephrosis.  Renal length 11.9 cm.  Increased cortical echogenicity.  Bladder:   Decompressed which may account for of wall thickening up to 12 mm.  Other findings:  Scattered small volume ascites.  IMPRESSION: 1.  Chronic medical renal disease.  No hydronephrosis. 2.  Bladder wall thickening, presumably related to the decompressed state of the bladder.  UTI not excluded. 3.  Scattered small volume ascites.   Original Report Authenticated By: Harley Hallmark, M.D.     Labs: BMET  Lab 08/23/12 0640 08/22/12 0628 08/21/12 1349 08/21/12 0815  NA 137 134* 131* 132*  K 3.4* 3.5 3.6 3.8  CL 94* 91* 89* 89*  CO2 33* 30 28 29   GLUCOSE 125* 143* 236* 227*  BUN 71* 87* 88* 85*  CREATININE 2.80* 3.57* 3.74* 3.69*  ALB -- -- -- --  CALCIUM 9.4 9.3 9.2 9.2  PHOS 3.4 -- -- --   CBC  Lab 08/23/12 0640 08/21/12 1349 08/21/12 0815  WBC 5.0 3.8* 4.3  NEUTROABS -- 3.0 --  HGB 9.8* 9.8* 10.1*  HCT 30.9* 31.1* 31.2*  MCV 86.3 86.4 85.7  PLT 181 176 196    Medications:       . Chlorhexidine Gluconate Cloth  6 each Topical Q0600  . colchicine  0.3 mg Oral Daily  . collagenase   Topical Daily  . docusate sodium  100 mg Oral BID  . ferumoxytol  1,020 mg Intravenous Once  . furosemide  160 mg Oral Q8H  . HYDROcodone-acetaminophen  1 tablet Oral TID PC & HS  . insulin aspart  0-15 Units Subcutaneous  TID WC  . insulin glargine  30 Units Subcutaneous q morning - 10a  . isosorbide mononitrate  60 mg Oral QHS  . lactulose  20 g Oral BID  . linagliptin  5 mg Oral Daily  . mupirocin ointment   Nasal BID  . pantoprazole  40 mg Oral Q1200  . potassium chloride  40 mEq Oral BID  . sodium chloride  3 mL Intravenous Q12H  . Tamsulosin HCl  0.4 mg Oral Daily  . DISCONTD: donepezil  5 mg Oral QHS     Assessment/ Plan:   Pt is a 56 y.o. yo male with CHF HYHA Class III, Stage III-IV CKD, cirrhosis, and prostatic hypertrophy who presented for placement of an ICD, but the procedure was not performed secondary to worsening renal status.   CV: Patient needs replacement iof his BiV ICD  placed but needs improved renal function and volume removal - Dr. Ladona Ridgel increased dobutamine 9/26 - Taking patient for procedure today    Acute on Chronic Kidney Injury: The kidney disease is borderline stage IV and likely secondary to long standing diabetes and cardiac disease. Baseline Creatinine is close to 2 and GFR in the 30s. At presentation his Cr was 3.69, BUN 85, and GFR 20. Likely cardiorenal syndrome with decreased renal perfusion secondary to CHF. FeNa< 5. Foley placed. No hydronephrosis on ultrasound. - Improved BUN and Cr today with increased dobutamine and Lasix  - continue Lasix 160 TID since still volume overloaded - continue K+ 40 meq BID to offset alkalemia    Anemia: Hemoglobin 9.8 at admission, which is close to his previous measurements, but it was 12.7 in April. Fe 41, Sat ratio 10. - start aranesp - Ferreheme IV today x 1   Enchephalopathy: DDX include encephalopathy or excess Gabapentin; D/c'd gabapentin; Ammonia 83 at admission:  - given lactulose on 9/26 for hyperammonemia, f/u ammonia in 2-3 days  - d/c'd aricept   Si Raider. Clinton Sawyer, MD  08/23/2012, 10:00 AM   I have seen and examined this patient and agree with the plan of care seen and eval.  Have made progress with fluid. Cr mildly improved.  Will address secondary issues also. Marland Kitchen  Hibo Blasdell L 08/23/2012, 11:29 AM

## 2012-08-23 NOTE — Interval H&P Note (Signed)
History and Physical Interval Note:  08/23/2012 1:41 PM  [name]  has presented today for surgery, with the diagnosis of a  The various methods of treatment have been discussed with the patient and family. After consideration of risks, benefits and other options for treatment, the patient has consented to  Procedure(s) (LRB) with comments: IMPLANTABLE CARDIOVERTER DEFIBRILLATOR IMPLANT (N/A) as a surgical intervention .  The patient's history has been reviewed, patient examined, no change in status, stable for surgery.  I have reviewed the patient's chart and labs.  Questions were answered to the patient's satisfaction.     Lewayne Bunting, M.D.

## 2012-08-24 ENCOUNTER — Inpatient Hospital Stay (HOSPITAL_COMMUNITY): Payer: Medicare Other

## 2012-08-24 DIAGNOSIS — I509 Heart failure, unspecified: Secondary | ICD-10-CM

## 2012-08-24 DIAGNOSIS — I5023 Acute on chronic systolic (congestive) heart failure: Principal | ICD-10-CM

## 2012-08-24 DIAGNOSIS — N179 Acute kidney failure, unspecified: Secondary | ICD-10-CM

## 2012-08-24 LAB — CBC
HCT: 30.7 % — ABNORMAL LOW (ref 39.0–52.0)
Hemoglobin: 9.6 g/dL — ABNORMAL LOW (ref 13.0–17.0)
MCV: 88.7 fL (ref 78.0–100.0)
RBC: 3.46 MIL/uL — ABNORMAL LOW (ref 4.22–5.81)
RDW: 17.7 % — ABNORMAL HIGH (ref 11.5–15.5)
WBC: 5.5 10*3/uL (ref 4.0–10.5)

## 2012-08-24 LAB — RENAL FUNCTION PANEL
BUN: 54 mg/dL — ABNORMAL HIGH (ref 6–23)
Glucose, Bld: 107 mg/dL — ABNORMAL HIGH (ref 70–99)
Phosphorus: 2.9 mg/dL (ref 2.3–4.6)
Potassium: 3.5 mEq/L (ref 3.5–5.1)

## 2012-08-24 LAB — GLUCOSE, CAPILLARY
Glucose-Capillary: 154 mg/dL — ABNORMAL HIGH (ref 70–99)
Glucose-Capillary: 203 mg/dL — ABNORMAL HIGH (ref 70–99)

## 2012-08-24 NOTE — Progress Notes (Signed)
[facility] Progress Note    Subjective:   BiV pacer with ICD placed yesterday by Dr. Ladona Ridgel without immediate complication. Patient without complaints this morning.    Objective:   BP 144/84  Pulse 84  Temp 98.8 F (37.1 C) (Oral)  Resp 20  Ht 5' 9.5" (1.765 m)  Wt 238 lb 8.6 oz (108.2 kg)  BMI 34.72 kg/m2  SpO2 93%  Intake/Output Summary (Last 24 hours) at 08/24/12 0656 Last data filed at 08/23/12 1855  Gross per 24 hour  Intake      0 ml  Output   3700 ml  Net  -3700 ml   Weight change:   Physical Exam: Gen: lying in bed relaxed, conversant, NAD, alert and oriented to place and situation but not time CVS: RRR, 3/6 systolic murmur Resp: mild bilateral lower rales ZOX:WRUE distended with fluid wave Ext: 3+ pitting edema of, buttocks, and sacrum Neuro: asterixis   Imaging: Dg Chest 2 View  08/22/2012  *RADIOLOGY REPORT*  Clinical Data: Dyspnea and CHF.  CHEST - 2 VIEW  Comparison: 08/15/2012  Findings: Lateral view degraded by patient arm position.  Suspect mild hyperinflation. Midline trachea.  Mild cardiomegaly. Mediastinal contours otherwise within normal limits. On the lateral view, a small pleural effusion is suspected.  Favor right-sided.  No pneumothorax.  No congestive failure.  Clear lungs.  IMPRESSION: Cardiomegaly without congestive failure.  Suspicion of a small pleural effusion on the lateral. Given technique, difficult to localize; favor right-sided.   Original Report Authenticated By: Consuello Bossier, M.D.     Labs: BMET  Lab 08/23/12 0857 08/23/12 0640 08/22/12 0628 08/21/12 1349 08/21/12 0815  NA 136 137 134* 131* 132*  K 3.4* 3.4* 3.5 3.6 3.8  CL 92* 94* 91* 89* 89*  CO2 33* 33* 30 28 29   GLUCOSE 129* 125* 143* 236* 227*  BUN 68* 71* 87* 88* 85*  CREATININE 2.69* 2.80* 3.57* 3.74* 3.69*  ALB -- -- -- -- --  CALCIUM 9.3 9.4 9.3 9.2 9.2  PHOS -- 3.4 -- -- --   CBC  Lab 08/23/12 0857 08/23/12 0640 08/21/12 1349 08/21/12 0815    WBC 4.5 5.0 3.8* 4.3  NEUTROABS -- -- 3.0 --  HGB 10.0* 9.8* 9.8* 10.1*  HCT 31.1* 30.9* 31.1* 31.2*  MCV 86.4 86.3 86.4 85.7  PLT 157 181 176 196    Medications:       . Chlorhexidine Gluconate Cloth  6 each Topical Q0600  . colchicine  0.3 mg Oral Daily  . collagenase   Topical Daily  . docusate sodium  100 mg Oral BID  . fentaNYL      . furosemide  160 mg Oral Q8H  . HYDROcodone-acetaminophen  1 tablet Oral TID PC & HS  . insulin aspart  0-15 Units Subcutaneous TID WC  . insulin glargine  30 Units Subcutaneous q morning - 10a  . isosorbide mononitrate  60 mg Oral QHS  . lactulose  20 g Oral BID  . lidocaine      . linagliptin  5 mg Oral Daily  . midazolam      . mupirocin ointment   Nasal BID  . pantoprazole  40 mg Oral Q1200  . potassium chloride  40 mEq Oral BID  . sodium chloride  3 mL Intravenous Q12H  . Tamsulosin HCl  0.4 mg Oral Daily  . vancomycin  2,000 mg Intravenous Q24H  . DISCONTD: chlorhexidine  60 mL Topical Once  . DISCONTD: chlorhexidine  60 mL Topical Once  . DISCONTD: gentamicin irrigation  80 mg Irrigation On Call  . DISCONTD: sodium chloride  3 mL Intravenous Q12H  . DISCONTD: sodium chloride  3 mL Intravenous Q12H  . DISCONTD: vancomycin  1,500 mg Intravenous On Call     Assessment/ Plan:   Pt is a 56 y.o. yo male with CHF HYHA Class III, Stage III-IV CKD, cirrhosis, and prostatic hypertrophy who presented for placement of an ICD, but the procedure was not performed secondary to worsening renal status.   CV: Patient needs replacement iof his BiV ICD placed but needs improved renal function and volume removal - Dr. Ladona Ridgel increased dobutamine 9/26 - Taking patient for procedure today    Acute on Chronic Kidney Injury: The kidney disease is borderline stage IV and likely secondary to long standing diabetes and cardiac disease. Baseline Creatinine is close to 2 and GFR in the 30s. At presentation his Cr was 3.69, BUN 85, and GFR 20. Likely  cardiorenal syndrome with decreased renal perfusion secondary to CHF. FeNa< 5. Foley placed. No hydronephrosis on ultrasound. - While BUN and Cr pending, he may be able to come off of dobutamine since pacer placed yesterday and patient will hopefully not need any more inotropic support  - continue Lasix 160 TID since still volume overloaded - continue K+ 40 meq BID to offset alkalemia    Anemia: Hemoglobin 9.8 at admission, which is close to his previous measurements, but it was 12.7 in April. Fe 41, Sat ratio 10. S/p Feraheme 9/27.  - continue Aranesp    Enchephalopathy: DDX include encephalopathy or excess Gabapentin; D/c'd gabapentin; Ammonia 83 at admission:  - continue Lactulose and check ammonia tomorrow  - d/c'd aricept   Si Raider. Clinton Sawyer, MD  08/24/2012, 6:56 AM   I have seen and examined this patient and agree with the plan of care seen and eval.  Have made progress with fluid. Cr mildly improved.  Will address secondary issues also. Clinton Sawyer, EDWARD 08/24/2012, 6:56 AM I have seen and examined this patient and agree with the plan of care seen and eval.  Will cont diruetics as is having good response.  Can d/c Dobut .  Cherrill Scrima L 08/24/2012, 9:55 AM

## 2012-08-24 NOTE — Progress Notes (Signed)
Orthopedic Tech Progress Note Patient Details:  [name] 01-Jul-1956 [id number]  Patient ID: Quinn Plowman, male   DOB: [birth year], 56 y.o.   MRN: [id number] Confirmed with pt RN that pt has am sling.   Temitope Griffing T 08/24/2012, 1:06 PM

## 2012-08-24 NOTE — Progress Notes (Signed)
Patient ID: [name], male   DOB: 01-30-1956, 57 y.o.   MRN: [id number] Subjective:  No chest pain, dyspnea improved.  Objective:  Vital Signs in the last 24 hours: Temp:  [98.8 F (37.1 C)] 98.8 F (37.1 C) (09/27 2200) Pulse Rate:  [67-86] 84  (09/27 2200) Resp:  [20] 20  (09/27 2200) BP: (98-144)/(61-84) 144/84 mmHg (09/27 2200) SpO2:  [93 %] 93 % (09/27 2200)  Intake/Output from previous day: 09/27 0701 - 09/28 0700 In: -  Out: 3700 [Urine:3700] Intake/Output from this shift:    Physical Exam: Chronically ill appearing NAD HEENT: Unremarkable Neck:  7 cm JVD, no thyromegally Lungs:  Rare basilar rales with no wheezes HEART:  Regular rate rhythm, no murmurs, no rubs, no clicks Abd:  soft, positive bowel sounds, no organomegally, no rebound, no guarding Ext:  2 plus pulses, 2+ edema, no cyanosis, no clubbing Skin:  No rashes no nodules Neuro:  CN II through XII intact, motor grossly intact AICD under right clavicle wound looks good  Lab Results:  Basename 08/23/12 0857 08/23/12 0640  WBC 4.5 5.0  HGB 10.0* 9.8*  PLT 157 181    Basename 08/23/12 0857 08/23/12 0640  NA 136 137  K 3.4* 3.4*  CL 92* 94*  CO2 33* 33*  GLUCOSE 129* 125*  BUN 68* 71*  CREATININE 2.69* 2.80*   No results found for this basename: TROPONINI:2,CK,MB:2 in the last 72 hours Hepatic Function Panel  Basename 08/23/12 0640 08/22/12 0628  PROT -- 7.2  ALBUMIN 3.2* --  AST -- 37  ALT -- 16  ALKPHOS -- 73  BILITOT -- 1.4*  BILIDIR -- --  IBILI -- --   No results found for this basename: CHOL in the last 72 hours No results found for this basename: PROTIME in the last 72 hours  Imaging: Dg Chest 2 View  08/24/2012  *RADIOLOGY REPORT*  Clinical Data: Status post biventricular ICD  CHEST - 2 VIEW  Comparison: 08/22/2038  Findings: Low lung volumes with bibasilar atelectasis. No pleural effusion or pneumothorax.  Stable cardiomegaly.  Right subclavian ICD in satisfactory position.   Mild degenerative changes of the visualized thoracolumbar spine.  IMPRESSION: Right subclavian ICD in satisfactory position.  No pneumothorax.   Original Report Authenticated By: Charline Bills, M.D.    Dg Chest 2 View  08/22/2012  *RADIOLOGY REPORT*  Clinical Data: Dyspnea and CHF.  CHEST - 2 VIEW  Comparison: 08/15/2012  Findings: Lateral view degraded by patient arm position.  Suspect mild hyperinflation. Midline trachea.  Mild cardiomegaly. Mediastinal contours otherwise within normal limits. On the lateral view, a small pleural effusion is suspected.  Favor right-sided.  No pneumothorax.  No congestive failure.  Clear lungs.  IMPRESSION: Cardiomegaly without congestive failure.  Suspicion of a small pleural effusion on the lateral. Given technique, difficult to localize; favor right-sided.   Original Report Authenticated By: Consuello Bossier, M.D.     Cardiac Studies: Tele - nsr Assessment/Plan:  1. Acute on chronic systolic CHF 2. Acute on chronic renal insufficiency 3. Cirrhosis with a normal TBili, transaminases but low albumin Rec: S/P AICD CXR looks good Continue dobutamine today.  He has arrangements for going back to rehab at [facility] on Monday  Primary F/U is with [facility].  LOS: 3 days    Fransico Meadow.D. 08/24/2012, 9:20 AM

## 2012-08-24 NOTE — Op Note (Signed)
NAMEJOHNN, KRASOWSKI NO.:  [id number]  MEDICAL RECORD NO.:  [id number]  LOCATION:  [id number]                        FACILITY:  [facility]  PHYSICIAN:  Doylene Canning. Ladona Ridgel, MD    DATE OF BIRTH:  Apr 25, 1956  DATE OF PROCEDURE:  08/23/2012 DATE OF DISCHARGE:                              OPERATIVE REPORT   PROCEDURE PERFORMED:  Insertion of a biventricular ICD by way of the right subclavian vein.  INTRODUCTION:  The patient is a 56 year old man with history of a longstanding ischemic cardiomyopathy status post chronic systolic heart failure and left bundle-branch block.  The patient underwent initial Bi- V ICD insertion years ago.  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  It was extracted 2 months ago, which was a very difficult procedure.  The patient has had worsening heart failure symptoms.  He presented for device implantation 2 days ago, but was in advanced heart failure at that time and was placed on IV dobutamine and IV Lasix, and he had a nice diuresis.  He is now referred for insertion of a new device.  PROCEDURE:  After informed consent was obtained, the patient was taken to the diagnostic EP lab in a fasting state.  After usual preparation and draping, intravenous fentanyl and midazolam was given for sedation. 30 mL of lidocaine was infiltrated into the right infraclavicular region.  A 7-cm incision was carried out over this region. Electrocautery was utilized to dissect down to the fascial plane.  The right subclavian vein was punctured x3 and the Medtronic model 5076 45 cm active fixation pacing lead, serial number [id number] was advanced to the right atrium.  The Medtronic model 6935 58 cm active fixation single coil defibrillation lead, serial number [id number] was advanced to the right ventricle.  Mapping was first carried out in the right ventricle. At the final site, the R-waves measured 9 mV, the pacing impedance was 700 ohms, and threshold 0.8 V at 0.5  msec.  10 V pacing did not stimulate the diaphragm.  With the RV lead in satisfactory position, attention was then turned to placement of the atrial lead, which was paced in the anterolateral portion of the right atrium where P-waves measured 3 mV.  The pacing impedance was 590 ohms and the threshold was 1.2 V at 0.5 msec.  A satisfactory injury currents were present both with atrial and ventricular lead insertion.  At this point, attention was then turned to placement of the left ventricular lead.  The 9-French guiding catheter along with a 6-French hexapolar EP catheter were advanced by way of the right subclavian vein into the right atrium. With quite a bit of difficulty, the coronary sinus was cannulated. Initial attempts to locate a satisfactory vein were carried out with the 0.14 angioplasty guidewire, and a small lateral vein was found, however, the guidewire would only be advanced approximately 1/3rd from base of the apex.  7.5 mL of IV contrast was injected into the guiding catheter to eliminate a very small lateral vein.  It should be noted there was also a high lateral vein, which was very small and a very posterior vein, which was of satisfactory size.  Multiple attempts to place the LV  lead in the lateral vein were carried out, all of them unsuccessful including the use of a subselective guiding catheter, as well as multiple angioplasty guidewires.  The problem was inability to traverse a very highly tortuous lateral vein.  It should be noted that this was the vein where the prior LV lead had been placed and there was significant scar tissue in the vein from this prior lead extraction procedure.  At this point, the high lateral vein was cannulated with the 0.14 guidewire and the LV lead was advanced over the guidewire, a distance of approximately 1/3rd from base to apex.  In this location, however, the pacing threshold was very high and the lead was really not far enough out  to get to the lateral portion of the high lateral vein. At this point, the posterior vein was selected for LV lead placement. It was advanced into a posterolateral branch of the posterior vein.  In this location, there was no diaphragmatic stimulation.  The pacing threshold was somewhat on the high side around 3 V at 0.8 msec. Unfortunately no other satisfactory location could be found for LV lead placement.  As this was the only option, the lead was deployed in this location, the guiding catheter was removed.  The sheaths were removed. The leads were secured to the subpectoral fascia with a figure-of-eight silk suture.  In addition, the sewing sleeves were also secured with silk suture.  Electrocautery was utilized to make a subcutaneous pocket. Antibiotic irrigation was utilized to irrigate the pocket. Electrocautery was utilized to assure hemostasis.  The Medtronic BiV ICD serial number [id number] was connected to the right atrial, right ventricular, and left ventricular pacing leads.  The pocket was irrigated with antibiotic irrigation.  The incision was closed with 2-0 and 3-0 Vicryl.  Benzoin and Steri-Strips were painted on the skin, pressure dressing was applied, and the patient was returned to his room in satisfactory condition.  COMPLICATIONS:  There were no immediate procedure complications.  RESULTS:  This demonstrates successful implantation of a Medtronic biventricular ICD in a patient with a longstanding ischemic cardiomyopathy, chronic systolic heart failure, left bundle-branch block, QRS duration of 170 msec.     Doylene Canning. Ladona Ridgel, MD     GWT/MEDQ  D:  08/23/2012  T:  08/24/2012  Job:  [id number]  cc:   Thurmon Fair, MD

## 2012-08-25 LAB — GLUCOSE, CAPILLARY: Glucose-Capillary: 258 mg/dL — ABNORMAL HIGH (ref 70–99)

## 2012-08-25 LAB — BASIC METABOLIC PANEL
CO2: 34 mEq/L — ABNORMAL HIGH (ref 19–32)
Chloride: 94 mEq/L — ABNORMAL LOW (ref 96–112)
Potassium: 4.1 mEq/L (ref 3.5–5.1)
Sodium: 138 mEq/L (ref 135–145)

## 2012-08-25 LAB — AMMONIA: Ammonia: 52 umol/L (ref 11–60)

## 2012-08-25 MED ORDER — DARBEPOETIN ALFA-POLYSORBATE 100 MCG/0.5ML IJ SOLN
100.0000 ug | INTRAMUSCULAR | Status: DC
  Filled 2012-08-25: qty 0.5

## 2012-08-25 MED ORDER — COLCHICINE 0.6 MG PO TABS: 0.3000 mg | ORAL_TABLET | Freq: Every day | ORAL | Status: DC

## 2012-08-25 MED ORDER — TICAGRELOR 90 MG PO TABS: 90.0000 mg | ORAL_TABLET | Freq: Two times a day (BID) | ORAL | Status: DC

## 2012-08-25 MED ORDER — DARBEPOETIN ALFA-POLYSORBATE 100 MCG/0.5ML IJ SOLN: 100.0000 ug | INTRAMUSCULAR | Status: DC

## 2012-08-25 MED ORDER — DOBUTAMINE IN D5W 4-5 MG/ML-% IV SOLN
5.0000 ug/kg/min | INTRAVENOUS | Status: DC
  Filled 2012-08-25: qty 250

## 2012-08-25 MED ORDER — COLLAGENASE 250 UNIT/GM EX OINT: TOPICAL_OINTMENT | Freq: Every day | CUTANEOUS | Status: DC

## 2012-08-25 MED ORDER — LACTULOSE 10 GM/15ML PO SOLN: 20.0000 g | Freq: Two times a day (BID) | ORAL | Status: DC

## 2012-08-25 MED ORDER — CIPROFLOXACIN HCL 250 MG PO TABS: 250.0000 mg | ORAL_TABLET | Freq: Two times a day (BID) | ORAL | Status: AC

## 2012-08-25 MED ORDER — FUROSEMIDE 80 MG PO TABS: 160.0000 mg | ORAL_TABLET | Freq: Three times a day (TID) | ORAL | Status: DC

## 2012-08-25 MED ORDER — POTASSIUM CHLORIDE CRYS ER 20 MEQ PO TBCR: 40.0000 meq | EXTENDED_RELEASE_TABLET | Freq: Two times a day (BID) | ORAL | Status: DC

## 2012-08-25 NOTE — Progress Notes (Signed)
Subjective: Interval History: has complaints wants to go home.  Objective: Vital signs in last 24 hours: Temp:  [98.3 F (36.8 C)-98.4 F (36.9 C)] 98.4 F (36.9 C) (09/29 0600) Pulse Rate:  [82-96] 90  (09/29 0600) Resp:  [18-20] 20  (09/29 0600) BP: (114-122)/(66-69) 119/66 mmHg (09/29 0600) SpO2:  [91 %-96 %] 94 % (09/29 0600) Weight change:   Intake/Output from previous day: 09/28 0701 - 09/29 0700 In: -  Out: 2125 [Urine:2125] Intake/Output this shift:    General appearance: alert, morbidly obese, pale and slowed mentation Resp: diminished breath sounds bilaterally and rales bibasilar Cardio: S1, S2 normal and systolic murmur: holosystolic 2/6, blowing at lower left sternal border GI: abnormal findings:  obese and pos bs, liver down 5 cm, distended. Extremities: edema 4+ and cool  Lab Results:  Basename 08/24/12 0941 08/23/12 0857  WBC 5.5 4.5  HGB 9.6* 10.0*  HCT 30.7* 31.1*  PLT 156 157   BMET:  Basename 08/25/12 0522 08/24/12 0941  NA 138 138  K 4.1 3.5  CL 94* 95*  CO2 34* 33*  GLUCOSE 171* 107*  BUN 45* 54*  CREATININE 2.25* 2.27*  CALCIUM 9.4 9.3   No results found for this basename: PTH:2 in the last 72 hours Iron Studies: No results found for this basename: IRON,TIBC,TRANSFERRIN,FERRITIN in the last 72 hours  Studies/Results: Dg Chest 2 View  08/24/2012  *RADIOLOGY REPORT*  Clinical Data: Status post biventricular ICD  CHEST - 2 VIEW  Comparison: 08/22/2038  Findings: Low lung volumes with bibasilar atelectasis. No pleural effusion or pneumothorax.  Stable cardiomegaly.  Right subclavian ICD in satisfactory position.  Mild degenerative changes of the visualized thoracolumbar spine.  IMPRESSION: Right subclavian ICD in satisfactory position.  No pneumothorax.   Original Report Authenticated By: Charline Bills, M.D.     I have reviewed the patient's current medications.  Assessment/Plan: 1 CKD with AKI.  AKI resolved.  Diuresing, cont lasix 2  Cirrhosis cont Lactulose as AMS better 3 CM ? D/c dobut. S/p pacer 4 Anemia give epo,, received Fe 5 Obesity 6 EOL issues  Goals of care need to be estab.  ? Palliative consult  P Lasix., epo. D/c foley    LOS: 4 days   Cionna Collantes L 08/25/2012,8:40 AM

## 2012-08-25 NOTE — Progress Notes (Addendum)
CSW was consulted to complete discharge of patient. Pt to transfer to [facility] today via [facility]. [facility] is aware of d/c, and patient stated he informed his family of d/c and declined this CSW to contact them. D/C packet complete with chart copy, signed FL2, and signed hard Rx.  CSW signing off as no other CSW needs identified at this time.  Lia Foyer, LCSWA [facility] Clinical Social Worker Contact #: [phone] (weekend)

## 2012-08-25 NOTE — Discharge Summary (Signed)
Discharge Summary   Patient ID: [name],  MRN: [id number], DOB/AGE: 1955/12/23 56 y.o.  Admit date: 08/21/2012 Discharge date: 08/25/2012   Primary Care Physician:  Galvin Proffer   Primary Cardiologist:  Dr. Jomarie Longs with [facility] Primary Electrophysiologist:  Dr. Lewayne Bunting   Reason for Admission:  Acute on Chronic Systolic CHF  Primary Discharge Diagnoses:   1. Acute on Chronic Systolic CHF     -  IV diuresis augmented with IV inotrope therapy this admission 2. Acute on Chronic Renal Failure     -  Likely related to hepatorenal syndrome     -  Followed by nephrology this admission     -  Discharge creatinine 2.25 (peak creatinine 3.74) 3. Encephalopathy     -  Question related to medications versus cirrhosis     -  Gabapentin and Aricept discontinued     -  Lactulose administered for elevated ammonia 4. Ischemic Cardiomyopathy     -  CRT-D reimplanted this admission 5. Anemia 6. Atrial Fibrillation     -  Amiodarone discontinued secondary to history of cirrhosis 7. Urinary Tract Infection     -  Cipro started at d/c (ID and sensitivity pending at time of dictation)  Secondary Discharge Diagnoses:   Past Medical History  Diagnosis Date  . Dyslipidemia     takes Tricor daily  . Stroke   . Asthma   . PONV (postoperative nausea and vomiting)   . Atrial fibrillation     takes Amiodarone daily  . Coronary artery disease   . Peripheral vascular disease   . Myocardial infarction     several;but doesn't recall the last time  . CHF (congestive heart failure)     was on Lasix but has been off for a couple of weeks  . Shortness of breath     with exertion  . Pneumonia     history of   . Peripheral neuropathy   . Arthritis     left foot and left leg  . Gout     takes Colchicine daily  . Scratches     all over legs from dog   . H/O hiatal hernia   . GERD (gastroesophageal reflux disease)     takes Omeprazole daily  . Hepatitis     hx of;6-89yrs ago;doesn't  remember which one  . Chronic constipation   . Chronic diarrhea   . Hemorrhoids   . Urinary frequency   . Urinary urgency   . Enlarged prostate     takes Flomax daily  . History of bladder infections   . Chronic kidney disease, stage 3, mod decreased GFR     pt states NO DIALYSIS  . Diabetes mellitus     novolog daily and lantus 20units nightly  . Depression     takes Aricept nightly  . Insomnia   . Hypertension     takes Carvedilol and Imdur daily and Hydralazine daily      Allergies  Allergen Reactions  . Cephalosporins Nausea Only  . Morphine And Related Other (See Comments)    hypotension  . Penicillins Other (See Comments)    Reaction as a child -unknown      Procedures Performed This Admission:  Re-Implantation of Medtronic BiV-ICD   Hospital Course: [name] is a 56 y.o. male with an ischemic CM, systolic CHF, prior VF arrest s/p ICD implantation in 2006 with upgrade to BiV ICD with CRT in January 2011, severe CAD, insulin-dependent  diabetes, hyperlipidemia, hypertension, chronic kidney disease-stage III, atrial fibrillation, stroke and asthma and cirrhosis.  He was admitted 7/13 with hemiplegia. During his workup, he was noted to have a pocket infection of his CRT-D. his device was extracted 06/26/12. He was placed on a life vest.  He was readmitted in 8/13 with acute on chronic systolic heart failure.  He was seen back in the office by Dr. Ladona Ridgel earlier this month with plans for reimplantation of his device. He presented to the hospital in 08/21/12 for reimplantation of his device. Over the previous several weeks he had increasing abdominal girth, shortness of breath and peripheral edema. His renal function had also worsened. Given his acute on chronic CHF, he was admitted for IV inotropes prior to proceeding with reinsertion of his device. Amiodarone was d/c due to diagnosis of cirrhosis.  Nephrology was asked to see the patient due to worsening renal function. He  was placed on dobutamine. Nephrology felt that his chronic kidney disease was likely secondary to long-standing diabetes and cardiac disease. Patient was noted to have multiple factors predisposing him to acute kidney injury including CHF, gout and colchicine and BPH. Overall picture was concerning for cardiorenal syndrome with decreased renal perfusion secondary to CHF. No hydronephrosis noted on renal ultrasound. His diuretic management was performed by nephrology. Ammonia levels increased and he was given lactulose. Symptoms of encephalopathy were noted and his gabapentin and aricept were discontinued. There was great concern for his rapid decline since his biventricular pacer was removed. Patient subsequently underwent reimplantation of his Medtronic CRT-D by Dr. Ladona Ridgel via the right subclavian vein 08/23/12. There were no immediate complications. Patient continued to show improvement. He was evaluated by Dr. Eden Emms on the morning of discharge. Patient was eager to be discharged back to his skilled nursing facility. His dobutamine was weaned off. At the time of this dictation, the patient is awaiting discontinuation of his dobutamine and Foley catheter. He is also awaiting approval from his skilled nursing facility to return to his bed. Barring any complications he will be discharged to SNF later today in stable condition.  Of note, upon review of his labs at discharge, it was noted that he had greater than 100,000 colony-forming units of gram-negative rods in his urine. He was started on 250 mg of Cipro twice a day for 5 days at discharge.   Discharge Vitals: Blood pressure 119/66, pulse 90, temperature 98.4 F (36.9 C), temperature source Oral, resp. rate 20, height 5' 9.5" (1.765 m), weight 238 lb 8.6 oz (108.2 kg), SpO2 94.00%.   Labs: Lab Results  Component Value Date   WBC 5.5 08/24/2012   HGB 9.6* 08/24/2012   HCT 30.7* 08/24/2012   MCV 88.7 08/24/2012   PLT 156 08/24/2012     Lab 08/25/12 0522  08/22/12 0628  NA 138 --  K 4.1 --  CL 94* --  CO2 34* --  BUN 45* --  CREATININE 2.25* --  CALCIUM 9.4 --  PROT -- 7.2  BILITOT -- 1.4*  ALKPHOS -- 73  ALT -- 16  AST -- 37  GLUCOSE 171* --   No results found for this basename: CKTOTAL:4,CKMB:4,TROPONINI:4 in the last 72 hours Lab Results  Component Value Date   CHOL 94 06/18/2012   HDL 26* 06/18/2012   LDLCALC 22 06/18/2012   TRIG 232* 06/18/2012   Lab Results  Component Value Date   DDIMER 0.57* 07/07/2012   Lab Results  Component Value Date   TSH 2.111 02/14/2011  Diagnostic Procedures and Studies:  Dg Chest 2 View  08/24/2012  *RADIOLOGY REPORT*  Clinical Data: Status post biventricular ICD  CHEST - 2 VIEW  Comparison: 08/22/2038  Findings: Low lung volumes with bibasilar atelectasis. No pleural effusion or pneumothorax.  Stable cardiomegaly.  Right subclavian ICD in satisfactory position.  Mild degenerative changes of the visualized thoracolumbar spine.  IMPRESSION: Right subclavian ICD in satisfactory position.  No pneumothorax.   Original Report Authenticated By: Charline Bills, M.D.    Dg Chest 2 View  08/22/2012  *RADIOLOGY REPORT*  Clinical Data: Dyspnea and CHF.  CHEST - 2 VIEW  Comparison: 08/15/2012  Findings: Lateral view degraded by patient arm position.  Suspect mild hyperinflation. Midline trachea.  Mild cardiomegaly. Mediastinal contours otherwise within normal limits. On the lateral view, a small pleural effusion is suspected.  Favor right-sided.  No pneumothorax.  No congestive failure.  Clear lungs.  IMPRESSION: Cardiomegaly without congestive failure.  Suspicion of a small pleural effusion on the lateral. Given technique, difficult to localize; favor right-sided.   Original Report Authenticated By: Consuello Bossier, M.D.    US Renal  08/21/2012  *RADIOLOGY REPORT*  Clinical Data: 56 year old male with acute renal failure.  Diabetes hypertension and hepatitis.  RENAL/URINARY TRACT ULTRASOUND COMPLETE   Comparison:  CT abdomen and pelvis 08/19/2012.  Findings:  Right Kidney:  No hydronephrosis.  Renal length 12.5 cm.  Increased cortical echogenicity compared to the liver.  Left Kidney:  No hydronephrosis.  Renal length 11.9 cm.  Increased cortical echogenicity.  Bladder:  Decompressed which may account for of wall thickening up to 12 mm.  Other findings:  Scattered small volume ascites.  IMPRESSION: 1.  Chronic medical renal disease.  No hydronephrosis. 2.  Bladder wall thickening, presumably related to the decompressed state of the bladder.  UTI not excluded. 3.  Scattered small volume ascites.   Original Report Authenticated By: Harley Hallmark, M.D.    Dg Chest Port 1 View  07/28/2012  *RADIOLOGY REPORT*  Clinical Data: Chest pain, shortness of breath and chest congestion.  PORTABLE CHEST - 1 VIEW  Comparison: 07/08/2012.  Findings: No significant change in enlargement of the cardiac silhouette and prominence of the pulmonary vasculature.  Multiple metallic devices are again demonstrated overlying the chest.  The visualized lungs are clear.  Thoracic spine degenerative changes.  IMPRESSION:  1.  Stable cardiomegaly and pulmonary vascular congestion. 2.  No acute abnormality.   Original Report Authenticated By: Darrol Angel, M.D.      Disposition Pt is being discharged to his SNF today in improved condition.   Follow-up Plans & Appointments      Follow-up Information    Follow up with CROITORU,MIHAI, MD. In 2 weeks. (office will call you)    Contact information:   [address] [place] [address] [phone]       Follow up with [facility] In 10 days. (office will call you)    Contact information:   [address] [place] [address]       Follow up with Lewayne Bunting, MD. In 3 months. (office will call you)    Contact information:   [address] [place] [address] [phone]          Discharge  Medications    Medication List     As of 08/25/2012 11:37 AM    STOP taking these medications  acetaminophen 650 MG CR tablet   Commonly known as: TYLENOL      amiodarone 200 MG tablet   Commonly known as: PACERONE      donepezil 5 MG tablet   Commonly known as: ARICEPT      febuxostat 40 MG tablet   Commonly known as: ULORIC      fenofibrate micronized 200 MG capsule   Commonly known as: LOFIBRA      fluconazole 100 MG tablet   Commonly known as: DIFLUCAN      gabapentin 300 MG capsule   Commonly known as: NEURONTIN      metolazone 5 MG tablet   Commonly known as: ZAROXOLYN      nystatin ointment   Commonly known as: MYCOSTATIN      potassium chloride 10 MEQ tablet   Commonly known as: K-DUR      TAKE these medications         carvedilol 6.25 MG tablet   Commonly known as: COREG   Take 1 tablet (6.25 mg total) by mouth 2 (two) times daily with a meal.      ciprofloxacin 250 MG tablet   Commonly known as: CIPRO   Take 1 tablet (250 mg total) by mouth 2 (two) times daily.      colchicine 0.6 MG tablet   Take 0.5 tablets (0.3 mg total) by mouth daily.      collagenase ointment   Commonly known as: SANTYL   Apply topically daily.      darbepoetin 100 MCG/0.5ML Soln   Commonly known as: ARANESP   Inject 0.5 mLs (100 mcg total) into the skin every Sunday at 6pm.      docusate sodium 100 MG capsule   Commonly known as: COLACE   Take 100 mg by mouth 2 (two) times daily.      furosemide 80 MG tablet   Commonly known as: LASIX   Take 2 tablets (160 mg total) by mouth every 8 (eight) hours.      hydrALAZINE 25 MG tablet   Commonly known as: APRESOLINE   Take 25 mg by mouth 3 (three) times daily.      HYDROcodone-acetaminophen 5-500 MG per tablet   Commonly known as: VICODIN   Take 1 tablet by mouth 4 (four) times daily - after meals and at bedtime. PAIN      insulin glargine 100 UNIT/ML injection   Commonly known as: LANTUS   Inject 30 Units  into the skin every morning.      isosorbide mononitrate 60 MG 24 hr tablet   Commonly known as: IMDUR   Take 60 mg by mouth at bedtime.      lactulose 10 GM/15ML solution   Commonly known as: CHRONULAC   Take 30 mLs (20 g total) by mouth 2 (two) times daily.      linagliptin 5 MG Tabs tablet   Commonly known as: TRADJENTA   Take 5 mg by mouth daily.      magnesium oxide 400 MG tablet   Commonly known as: MAG-OX   Take 400 mg by mouth 2 (two) times daily.      nitroGLYCERIN 0.4 MG SL tablet   Commonly known as: NITROSTAT   Place 0.4 mg under the tongue every 5 (five) minutes as needed. For chest pain      omeprazole 20 MG capsule   Commonly known as: PRILOSEC   Take 40 mg by mouth 2 (two) times daily.  potassium chloride SA 20 MEQ tablet   Commonly known as: K-DUR,KLOR-CON   Take 2 tablets (40 mEq total) by mouth 2 (two) times daily.      Tamsulosin HCl 0.4 MG Caps   Commonly known as: FLOMAX   Take 0.4 mg by mouth daily.      Ticagrelor 90 MG Tabs tablet   Commonly known as: BRILINTA   Take 1 tablet (90 mg total) by mouth 2 (two) times daily.   Start taking on: 08/30/2012      vitamin B-12 250 MCG tablet   Commonly known as: CYANOCOBALAMIN   Take 250 mcg by mouth daily.           Outstanding Labs/Studies  1. He can re-start Brilinta 08/30/2012 2. Urine organism ID and sensitivity pending.   Duration of Discharge Encounter: Greater than 30 minutes including physician and PA time.  Signed, Tereso Newcomer, PA-C  11:37 AM 08/25/2012        [address] [place], [place]  [address] Phone: [phone] Fax:  [phone]

## 2012-08-25 NOTE — Progress Notes (Signed)
  Patient ID: [name], male   DOB: December 27, 1955, 56 y.o.   MRN: [id number] Subjective:  No chest pain, dyspnea improved. Wants to go back to assisted living today  Objective:  Vital Signs in the last 24 hours: Temp:  [98.3 F (36.8 C)-98.4 F (36.9 C)] 98.4 F (36.9 C) (09/29 0600) Pulse Rate:  [82-96] 90  (09/29 0600) Resp:  [18-20] 20  (09/29 0600) BP: (114-122)/(66-69) 119/66 mmHg (09/29 0600) SpO2:  [91 %-96 %] 94 % (09/29 0600)  Intake/Output from previous day: 09/28 0701 - 09/29 0700 In: -  Out: 2125 [Urine:2125] Intake/Output from this shift:    Physical Exam: Chronically ill appearing NAD HEENT: Unremarkable Neck:  7 cm JVD, no thyromegally Lungs:  Rare basilar rales with no wheezes HEART:  Regular rate rhythm, no murmurs, no rubs, no clicks Abd:  soft, positive bowel sounds, no organomegally, no rebound, no guarding Ext:  2 plus pulses, 2+ edema, no cyanosis, no clubbing Skin:  No rashes no nodules Neuro:  CN II through XII intact, motor grossly intact AICD under right clavicle wound looks good  Lab Results:  Basename 08/24/12 0941 08/23/12 0857  WBC 5.5 4.5  HGB 9.6* 10.0*  PLT 156 157    Basename 08/25/12 0522 08/24/12 0941  NA 138 138  K 4.1 3.5  CL 94* 95*  CO2 34* 33*  GLUCOSE 171* 107*  BUN 45* 54*  CREATININE 2.25* 2.27*   No results found for this basename: TROPONINI:2,CK,MB:2 in the last 72 hours Hepatic Function Panel  Basename 08/24/12 0941  PROT --  ALBUMIN 3.1*  AST --  ALT --  ALKPHOS --  BILITOT --  BILIDIR --  IBILI --   No results found for this basename: CHOL in the last 72 hours No results found for this basename: PROTIME in the last 72 hours  Imaging: Dg Chest 2 View  08/24/2012  *RADIOLOGY REPORT*  Clinical Data: Status post biventricular ICD  CHEST - 2 VIEW  Comparison: 08/22/2038  Findings: Low lung volumes with bibasilar atelectasis. No pleural effusion or pneumothorax.  Stable cardiomegaly.  Right subclavian  ICD in satisfactory position.  Mild degenerative changes of the visualized thoracolumbar spine.  IMPRESSION: Right subclavian ICD in satisfactory position.  No pneumothorax.   Original Report Authenticated By: Charline Bills, M.D.     Cardiac Studies: Tele - nsr Assessment/Plan:  1. Acute on chronic systolic CHF 2. Acute on chronic renal insufficiency 3. Cirrhosis with a normal TBili, transaminases but low albumin Rec: S/P AICD CXR looks good D/C dobutamine  Foley out if he does well this am and rehab can take him D/C latter today  Outpatient F/U Dr Vivia Ewing [employer] and [facility] for wound check  Primary F/U is with [facility].  LOS: 4 days    Fransico Meadow.D. 08/25/2012, 8:46 AM

## 2012-08-26 ENCOUNTER — Telehealth: Payer: Self-pay | Admitting: Internal Medicine

## 2012-08-26 LAB — URINE CULTURE: Special Requests: NORMAL

## 2012-08-26 NOTE — Telephone Encounter (Signed)
PER KELLY PT DOES NEED APPTS HERE THEN FU WITH DR C AT [facility], WILL CALL PT BACK TO SCHEDULE

## 2012-08-26 NOTE — Telephone Encounter (Signed)
Called pt to set up wound ck and 91 day fu with taylor, per pt dr Eden Emms told him yesterday he was to follow up with dr cruotric (spelling? Pt spelled for me) at [facility], per scott after hours I was to set up appts here, pt asked I check with taylor to see what he wants, his appt with cruotric is 10-15

## 2013-07-22 IMAGING — CR DG CHEST 2V
2 series · 2 of 2 positions shown · non-contrast
Comparison: 02/21/2012

CLINICAL DATA: Pacemaker lead removal.  Short of breath.
Hypertension.  Diabetic.  CHF.

CHEST - 2 VIEW

[view not recorded (1 of 2)]
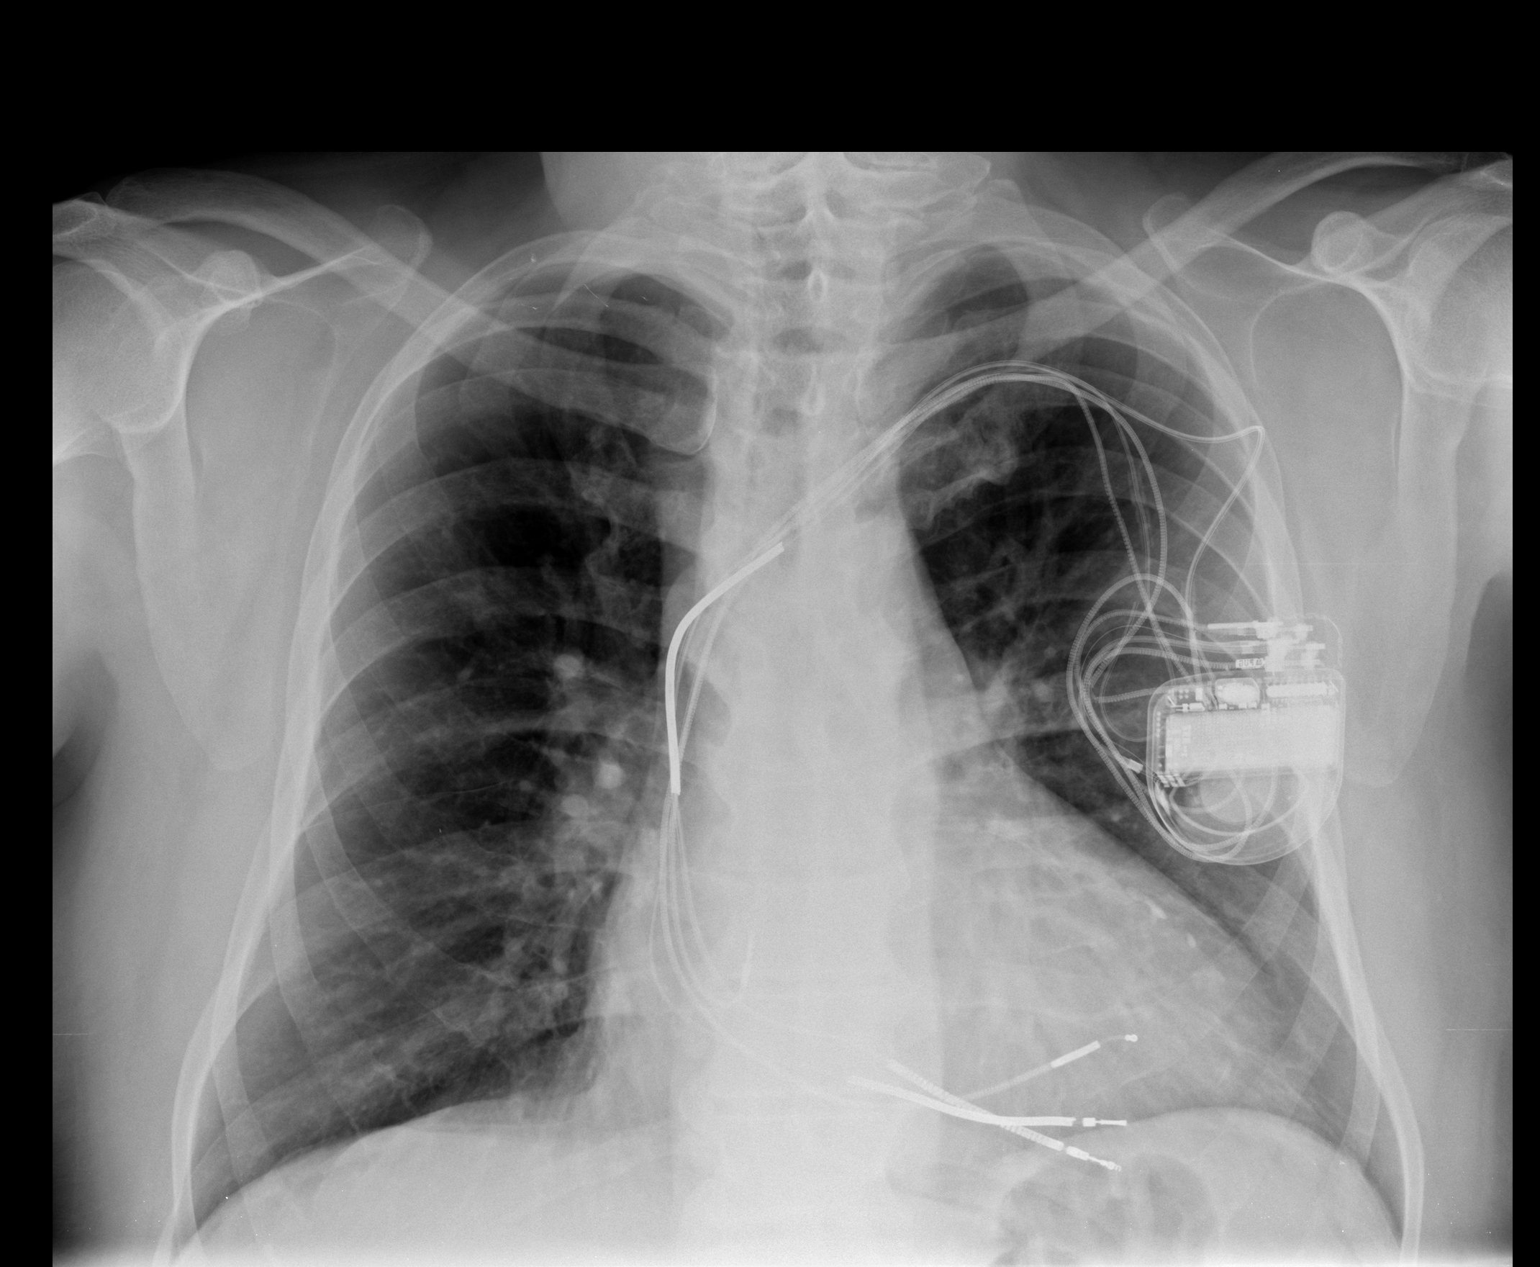

[view not recorded (2 of 2)]
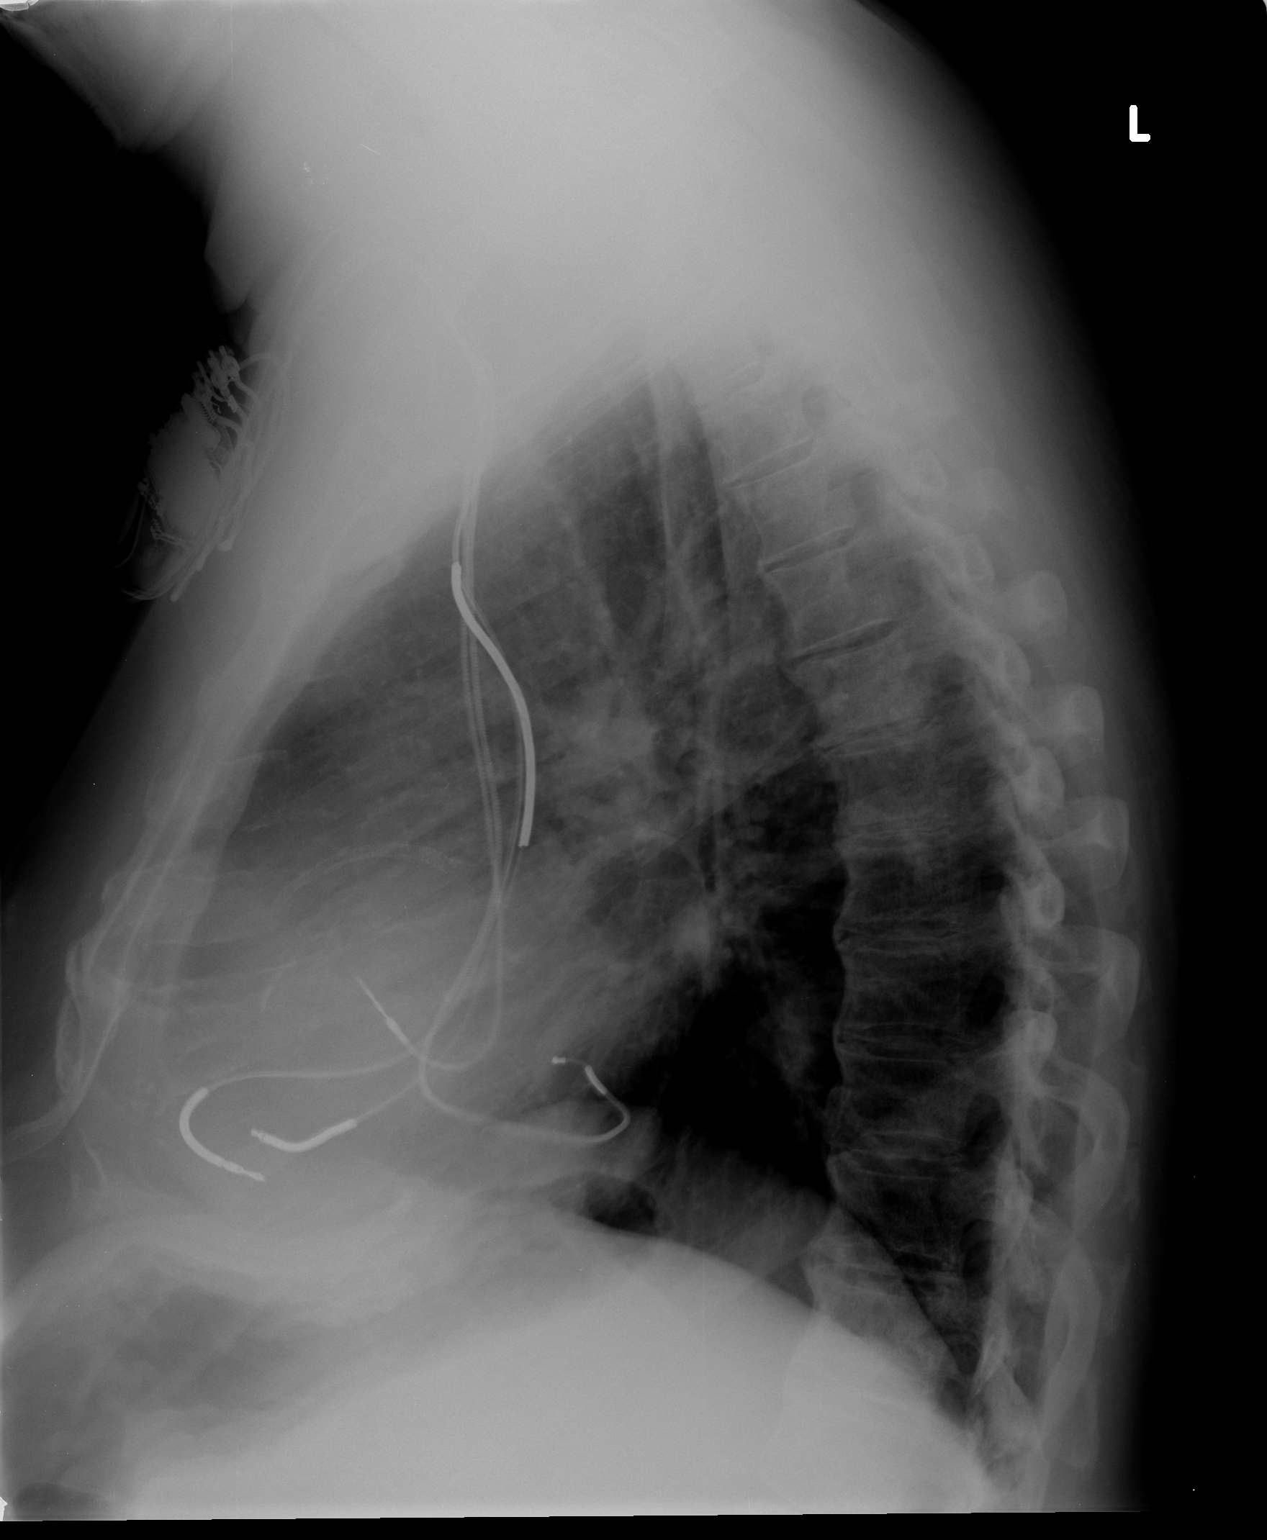

[2 of 2 positions shown; findings below may reference images not displayed]

FINDINGS: Moderate thoracic spondylosis.  Pacers and AICD devices.
This terminate at the right atrium, right ventricle, and coronary
sinus.

Midline trachea.  Mild cardiomegaly. No pleural effusion or
pneumothorax.  No congestive failure.

Clear lungs.
IMPRESSION: Cardiomegaly, without acute disease.

## 2013-08-27 HISTORY — PX: CHOLECYSTECTOMY: SHX55

## 2013-09-20 IMAGING — CR DG CHEST 2V
3 series · 3 of 3 positions shown · non-contrast
Comparison: 08/22/2038

CLINICAL DATA: Status post biventricular ICD

CHEST - 2 VIEW

[w chest lat]
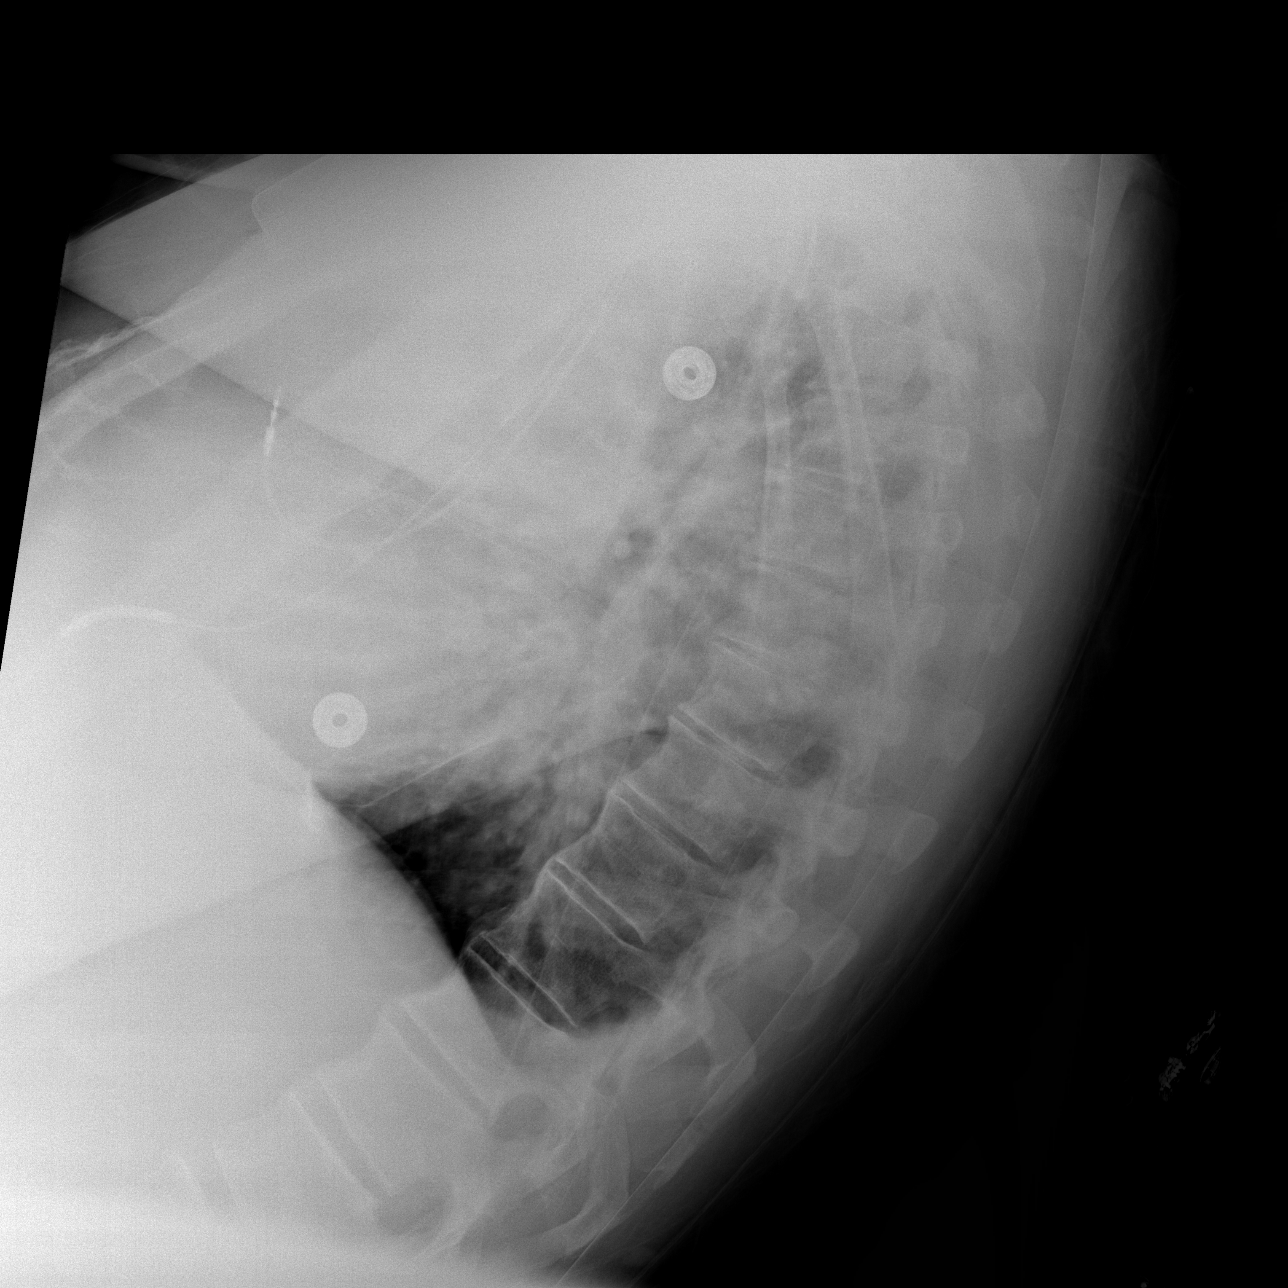

[AP]
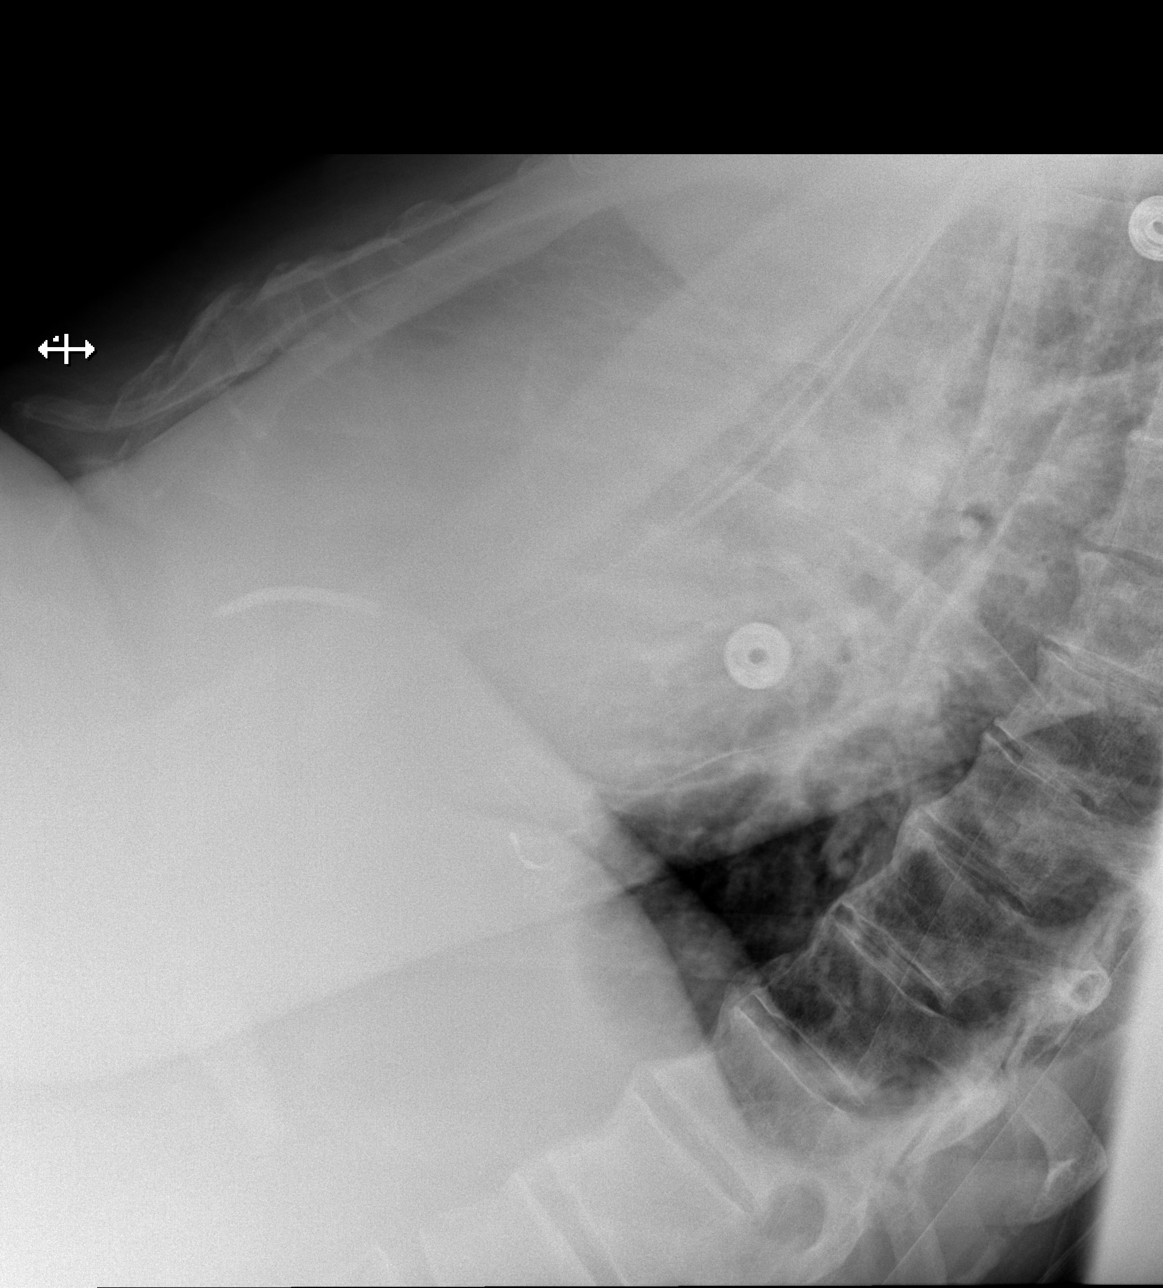

[x chest ap]
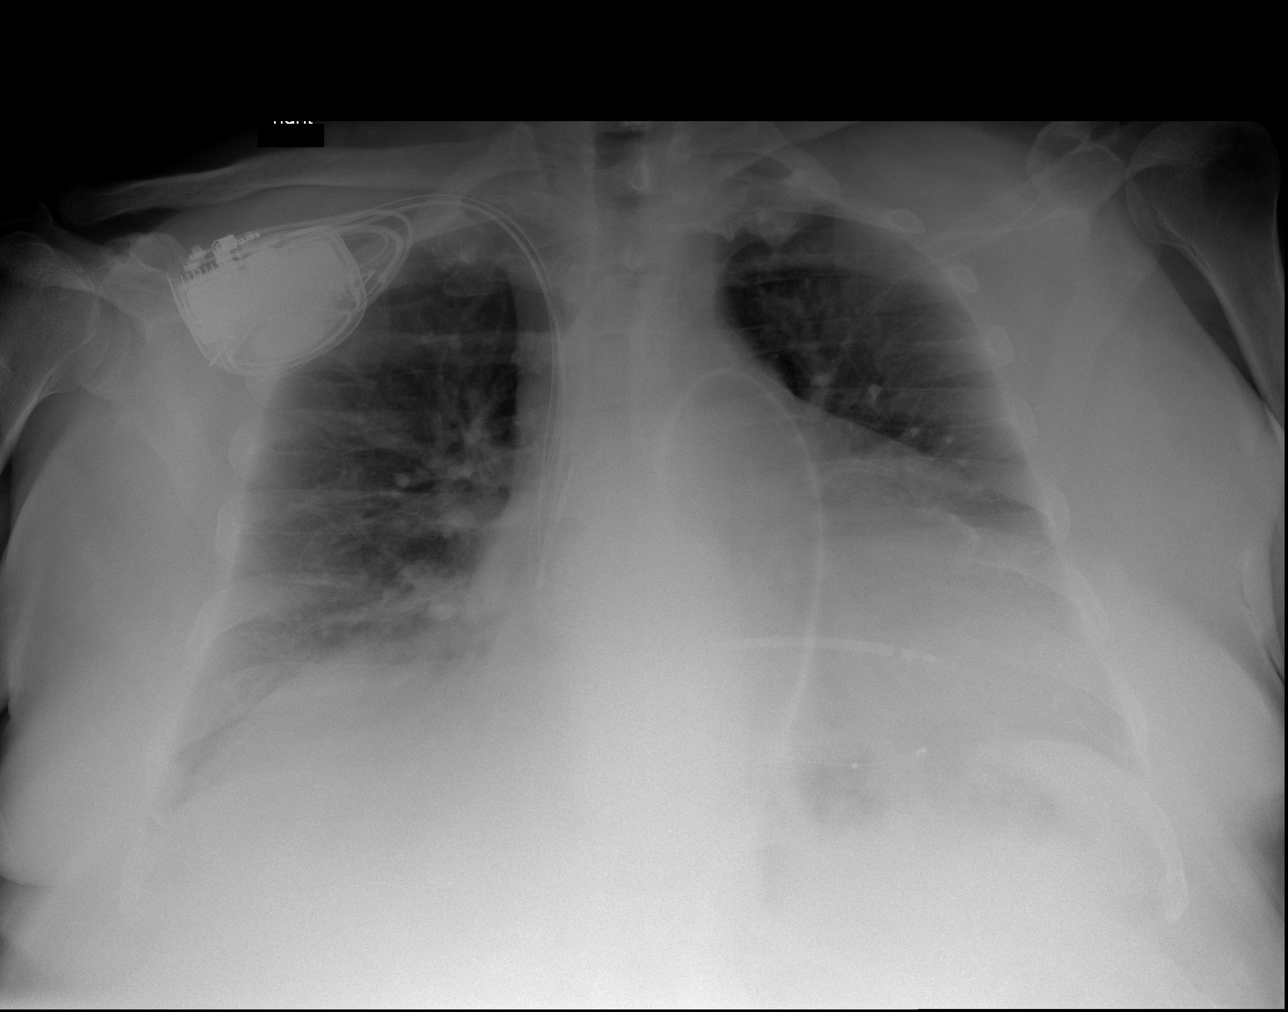

[3 of 3 positions shown; findings below may reference images not displayed]

FINDINGS: Low lung volumes with bibasilar atelectasis. No pleural
effusion or pneumothorax.

Stable cardiomegaly.  Right subclavian ICD in satisfactory
position.

Mild degenerative changes of the visualized thoracolumbar spine.
IMPRESSION: Right subclavian ICD in satisfactory position.

No pneumothorax.

## 2013-10-09 ENCOUNTER — Encounter (INDEPENDENT_AMBULATORY_CARE_PROVIDER_SITE_OTHER): Payer: Self-pay

## 2013-10-09 ENCOUNTER — Ambulatory Visit (INDEPENDENT_AMBULATORY_CARE_PROVIDER_SITE_OTHER): Payer: Medicare Other

## 2013-10-09 VITALS — BP 114/60 | HR 62 | Resp 18

## 2013-10-09 DIAGNOSIS — Q828 Other specified congenital malformations of skin: Secondary | ICD-10-CM

## 2013-10-09 DIAGNOSIS — E1140 Type 2 diabetes mellitus with diabetic neuropathy, unspecified: Secondary | ICD-10-CM | POA: Insufficient documentation

## 2013-10-09 DIAGNOSIS — E11610 Type 2 diabetes mellitus with diabetic neuropathic arthropathy: Secondary | ICD-10-CM | POA: Insufficient documentation

## 2013-10-09 DIAGNOSIS — E1142 Type 2 diabetes mellitus with diabetic polyneuropathy: Secondary | ICD-10-CM

## 2013-10-09 DIAGNOSIS — E1149 Type 2 diabetes mellitus with other diabetic neurological complication: Secondary | ICD-10-CM

## 2013-10-09 DIAGNOSIS — B351 Tinea unguium: Secondary | ICD-10-CM

## 2013-10-09 DIAGNOSIS — M79609 Pain in unspecified limb: Secondary | ICD-10-CM

## 2013-10-09 NOTE — Progress Notes (Signed)
  Subjective:    Patient ID: [name], male    DOB: Oct 14, 1956, 57 y.o.   MRN: [id number] Trim my nails HPI    Review of Systems  HENT: Negative.   Eyes: Positive for visual disturbance.  Respiratory: Positive for chest tightness and wheezing.   Cardiovascular: Positive for leg swelling.  Gastrointestinal: Positive for constipation.  Endocrine:       Excessive thirst  Genitourinary: Positive for urgency.  Musculoskeletal:       Difficulty walking and muscle pain  Skin:       Change in nails  Allergic/Immunologic: Negative.   Neurological: Positive for dizziness, weakness, light-headedness and numbness.  Hematological: Bruises/bleeds easily.       Slow to heal  Psychiatric/Behavioral: Negative.        Objective:   Physical Exam Vascular status unchanged patient does have a thready nonpalpable PT pulse bilateral dorsalis pedis pulse plus one over 4 bilateral. Significant edema left side with Charcot changes in foot left foot. Patient wears custom shoes with AFO attached brace. Dermatologically skin color pigment normal nails thick brittle friable criptotic in deformed patient had previous amputations second digit and fourth digit left second digit right. There is diffuse keratoses subsecond MTP area right foot secondary to plantar flexed metatarsal continues to have rigid digital contractures and abnormality gait.       Assessment & Plan:  Diabetes with history peripheral neuropathy. Charcot changes left foot with history of amputation of toes x2. Thick brittle friable mycotic nails debrided x8 and the presence of diabetes and complicated factors also debridement still keratotic lesion sub-#2 right foot. Recommend followup in 2-3 months for continued palliative care  Alvan Dame DPM

## 2013-10-09 NOTE — Patient Instructions (Signed)
Diabetes and Foot Care Diabetes may cause you to have problems because of poor blood supply (circulation) to your feet and legs. This may cause the skin on your feet to become thinner, break easier, and heal more slowly. Your skin may become dry, and the skin may peel and crack. You may also have nerve damage in your legs and feet causing decreased feeling in them. You may not notice minor injuries to your feet that could lead to infections or more serious problems. Taking care of your feet is one of the most important things you can do for yourself.  HOME CARE INSTRUCTIONS  Wear shoes at all times, even in the house. Do not go barefoot. Bare feet are easily injured.  Check your feet daily for blisters, cuts, and redness. If you cannot see the bottom of your feet, use a mirror or ask someone for help.  Wash your feet with warm water (do not use hot water) and mild soap. Then pat your feet and the areas between your toes until they are completely dry. Do not soak your feet as this can dry your skin.  Apply a moisturizing lotion or petroleum jelly (that does not contain alcohol and is unscented) to the skin on your feet and to dry, brittle toenails. Do not apply lotion between your toes.  Trim your toenails straight across. Do not dig under them or around the cuticle. File the edges of your nails with an emery board or nail file.  Do not cut corns or calluses or try to remove them with medicine.  Wear clean socks or stockings every day. Make sure they are not too tight. Do not wear knee-high stockings since they may decrease blood flow to your legs.  Wear shoes that fit properly and have enough cushioning. To break in new shoes, wear them for just a few hours a day. This prevents you from injuring your feet. Always look in your shoes before you put them on to be sure there are no objects inside.  Do not cross your legs. This may decrease the blood flow to your feet.  If you find a minor scrape,  cut, or break in the skin on your feet, keep it and the skin around it clean and dry. These areas may be cleansed with mild soap and water. Do not cleanse the area with peroxide, alcohol, or iodine.  When you remove an adhesive bandage, be sure not to damage the skin around it.  If you have a wound, look at it several times a day to make sure it is healing.  Do not use heating pads or hot water bottles. They may burn your skin. If you have lost feeling in your feet or legs, you may not know it is happening until it is too late.  Make sure your health care provider performs a complete foot exam at least annually or more often if you have foot problems. Report any cuts, sores, or bruises to your health care provider immediately. SEEK MEDICAL CARE IF:   You have an injury that is not healing.  You have cuts or breaks in the skin.  You have an ingrown nail.  You notice redness on your legs or feet.  You feel burning or tingling in your legs or feet.  You have pain or cramps in your legs and feet.  Your legs or feet are numb.  Your feet always feel cold. SEEK IMMEDIATE MEDICAL CARE IF:   There is increasing redness,   swelling, or pain in or around a wound.  There is a red line that goes up your leg.  Pus is coming from a wound.  You develop a fever or as directed by your health care provider.  You notice a bad smell coming from an ulcer or wound. Document Released: 11/10/2000 Document Revised: 07/16/2013 Document Reviewed: 04/22/2013 Exit[employer] Patient Information 2014 [employer].  

## 2013-12-17 ENCOUNTER — Other Ambulatory Visit: Payer: Self-pay | Admitting: Urology

## 2013-12-24 ENCOUNTER — Encounter (HOSPITAL_COMMUNITY): Payer: Self-pay | Admitting: Pharmacy Technician

## 2013-12-25 ENCOUNTER — Encounter (INDEPENDENT_AMBULATORY_CARE_PROVIDER_SITE_OTHER): Payer: Self-pay

## 2013-12-25 ENCOUNTER — Encounter (HOSPITAL_COMMUNITY): Payer: Self-pay

## 2013-12-25 ENCOUNTER — Encounter (HOSPITAL_COMMUNITY)
Admission: RE | Admit: 2013-12-25 | Discharge: 2013-12-25 | Disposition: A | Discharge status: Home / Self Care | Payer: Medicare Other | Source: Ambulatory Visit | Attending: Urology | Admitting: Urology

## 2013-12-25 DIAGNOSIS — Z01818 Encounter for other preprocedural examination: Secondary | ICD-10-CM | POA: Insufficient documentation

## 2013-12-25 DIAGNOSIS — Z01812 Encounter for preprocedural laboratory examination: Secondary | ICD-10-CM | POA: Insufficient documentation

## 2013-12-25 HISTORY — DX: Presence of cardiac pacemaker: Z95.0

## 2013-12-25 HISTORY — DX: Presence of automatic (implantable) cardiac defibrillator: Z95.810

## 2013-12-25 HISTORY — DX: Type 2 diabetes mellitus with diabetic neuropathic arthropathy: E11.610

## 2013-12-25 HISTORY — DX: Unspecified cirrhosis of liver: K74.60

## 2013-12-25 LAB — CBC
HCT: 36.9 % — ABNORMAL LOW (ref 39.0–52.0)
Hemoglobin: 13.5 g/dL (ref 13.0–17.0)
MCH: 31.8 pg (ref 26.0–34.0)
MCHC: 36.6 g/dL — ABNORMAL HIGH (ref 30.0–36.0)
MCV: 87 fL (ref 78.0–100.0)
PLATELETS: 98 10*3/uL — AB (ref 150–400)
RBC: 4.24 MIL/uL (ref 4.22–5.81)
RDW: 14.7 % (ref 11.5–15.5)
WBC: 4.4 10*3/uL (ref 4.0–10.5)

## 2013-12-25 LAB — BASIC METABOLIC PANEL
BUN: 57 mg/dL — ABNORMAL HIGH (ref 6–23)
CALCIUM: 9.4 mg/dL (ref 8.4–10.5)
CHLORIDE: 87 meq/L — AB (ref 96–112)
CO2: 27 meq/L (ref 19–32)
CREATININE: 2.34 mg/dL — AB (ref 0.50–1.35)
GFR calc non Af Amer: 29 mL/min — ABNORMAL LOW (ref >90)
GFR, EST AFRICAN AMERICAN: 34 mL/min — AB (ref >90)
Glucose, Bld: 492 mg/dL — ABNORMAL HIGH (ref 70–99)
Potassium: 4.9 mEq/L (ref 3.7–5.3)
SODIUM: 128 meq/L — AB (ref 137–147)

## 2013-12-25 LAB — SURGICAL PCR SCREEN
MRSA, PCR: NEGATIVE
STAPHYLOCOCCUS AUREUS: NEGATIVE

## 2013-12-25 NOTE — Patient Instructions (Addendum)
[address] Quinn PlowmanRobert E Elem  12/25/2013   Your procedure is scheduled on: 12/29/13  [facility].  Call this number if you have problems the morning of surgery [phone]: [phone]   Remember:NO VISITORS UNDER AGE 59 PER [place] POLICY.   Do not eat food or drink liquids After Midnight.     Take these medicines the morning of surgery with A SIP OF WATER: amiodarone, carvedilol, gabapentin, omeprazole   Do not wear jewelry, make-up or nail polish.  Do not wear lotions, powders, or perfumes. You may wear deodorant.  Do not shave 48 hours prior to surgery. Men may shave face and neck.  Do not bring valuables to the hospital.     Patients discharged the day of surgery will not be allowed to drive home.  Name and phone number of your driver: Algis LimingRuta [phone]   Please read over the following fact sheets that you were given: MRSA Information.  Birdie Sonsachel Krystall Kruckenberg, RN  pre op nurse call if needed [phone]    FAILURE TO FOLLOW THESE INSTRUCTIONS MAY RESULT IN CANCELLATION OF YOUR SURGERY   Patient Signature: ___________________________________________

## 2013-12-25 NOTE — Progress Notes (Signed)
Pacemaker orders requested from [facility] by fax x3

## 2013-12-25 NOTE — Progress Notes (Addendum)
EKG 07/14/13 on chart, stress test 07/14/13 on chart, ECHO 07/31/13 on chart, LOV note 12/01/13 Dr. Bing MatterKrasowski on chart, device clinic note 10/09/13 on chart, chest x-ray and CT abdomen and pelvis 09/08/13 on chart

## 2013-12-25 NOTE — Progress Notes (Signed)
12/25/13 1324  OBSTRUCTIVE SLEEP APNEA  Have you ever been diagnosed with sleep apnea through a sleep study? No  Do you snore loudly (loud enough to be heard through closed doors)?  1  Do you often feel tired, fatigued, or sleepy during the daytime? 0  Has anyone observed you stop breathing during your sleep? 1  Do you have, or are you being treated for high blood pressure? 1  BMI more than 35 kg/m2? 0  Age over 659 years old? 1  Neck circumference greater than 40 cm/18 inches? 1  Gender: 1  Obstructive Sleep Apnea Score 6  Score 4 or greater  Results sent to PCP

## 2013-12-25 NOTE — Progress Notes (Signed)
Pt wants to stay one night after surgery because there is no one to be with him for 24 hours. Please address.

## 2013-12-26 NOTE — Pre-Procedure Instructions (Signed)
PERIOPERATIVE PRESCRIPTION FOR IMPLANTED CARDIAC DEVICE PROGRAMMING ON CHART FROM [facility] [place] - POST OP INTERROGATION NOT NEEDED.

## 2013-12-29 ENCOUNTER — Ambulatory Visit (HOSPITAL_COMMUNITY): Payer: Medicare Other | Admitting: Registered Nurse

## 2013-12-29 ENCOUNTER — Encounter (HOSPITAL_COMMUNITY)
Admission: RE | Disposition: A | Discharge status: Home / Self Care | Payer: Self-pay | Source: Ambulatory Visit | Attending: Urology

## 2013-12-29 ENCOUNTER — Encounter (HOSPITAL_COMMUNITY): Payer: Self-pay | Admitting: *Deleted

## 2013-12-29 ENCOUNTER — Encounter (HOSPITAL_COMMUNITY): Payer: Medicare Other | Admitting: Registered Nurse

## 2013-12-29 ENCOUNTER — Ambulatory Visit (HOSPITAL_COMMUNITY)
Admission: RE | Admit: 2013-12-29 | Discharge: 2013-12-29 | Disposition: A | Discharge status: Home / Self Care | Payer: Medicare Other | Source: Ambulatory Visit | Attending: Urology | Admitting: Urology

## 2013-12-29 DIAGNOSIS — I2589 Other forms of chronic ischemic heart disease: Secondary | ICD-10-CM | POA: Insufficient documentation

## 2013-12-29 DIAGNOSIS — Z8673 Personal history of transient ischemic attack (TIA), and cerebral infarction without residual deficits: Secondary | ICD-10-CM | POA: Insufficient documentation

## 2013-12-29 DIAGNOSIS — N476 Balanoposthitis: Secondary | ICD-10-CM | POA: Insufficient documentation

## 2013-12-29 DIAGNOSIS — I5042 Chronic combined systolic (congestive) and diastolic (congestive) heart failure: Secondary | ICD-10-CM | POA: Insufficient documentation

## 2013-12-29 DIAGNOSIS — I129 Hypertensive chronic kidney disease with stage 1 through stage 4 chronic kidney disease, or unspecified chronic kidney disease: Secondary | ICD-10-CM | POA: Insufficient documentation

## 2013-12-29 DIAGNOSIS — K449 Diaphragmatic hernia without obstruction or gangrene: Secondary | ICD-10-CM | POA: Insufficient documentation

## 2013-12-29 DIAGNOSIS — N471 Phimosis: Secondary | ICD-10-CM

## 2013-12-29 DIAGNOSIS — I252 Old myocardial infarction: Secondary | ICD-10-CM | POA: Insufficient documentation

## 2013-12-29 DIAGNOSIS — N183 Chronic kidney disease, stage 3 unspecified: Secondary | ICD-10-CM | POA: Insufficient documentation

## 2013-12-29 DIAGNOSIS — I509 Heart failure, unspecified: Secondary | ICD-10-CM | POA: Insufficient documentation

## 2013-12-29 DIAGNOSIS — K7460 Unspecified cirrhosis of liver: Secondary | ICD-10-CM | POA: Insufficient documentation

## 2013-12-29 DIAGNOSIS — Z794 Long term (current) use of insulin: Secondary | ICD-10-CM | POA: Insufficient documentation

## 2013-12-29 DIAGNOSIS — I4891 Unspecified atrial fibrillation: Secondary | ICD-10-CM | POA: Insufficient documentation

## 2013-12-29 DIAGNOSIS — Z95810 Presence of automatic (implantable) cardiac defibrillator: Secondary | ICD-10-CM | POA: Insufficient documentation

## 2013-12-29 DIAGNOSIS — E119 Type 2 diabetes mellitus without complications: Secondary | ICD-10-CM | POA: Insufficient documentation

## 2013-12-29 DIAGNOSIS — N478 Other disorders of prepuce: Secondary | ICD-10-CM | POA: Insufficient documentation

## 2013-12-29 DIAGNOSIS — K219 Gastro-esophageal reflux disease without esophagitis: Secondary | ICD-10-CM | POA: Insufficient documentation

## 2013-12-29 DIAGNOSIS — N400 Enlarged prostate without lower urinary tract symptoms: Secondary | ICD-10-CM | POA: Insufficient documentation

## 2013-12-29 DIAGNOSIS — I2510 Atherosclerotic heart disease of native coronary artery without angina pectoris: Secondary | ICD-10-CM | POA: Insufficient documentation

## 2013-12-29 HISTORY — PX: CYSTOSCOPY: SHX5120

## 2013-12-29 LAB — GLUCOSE, CAPILLARY
GLUCOSE-CAPILLARY: 385 mg/dL — AB (ref 70–99)
GLUCOSE-CAPILLARY: 385 mg/dL — AB (ref 70–99)
GLUCOSE-CAPILLARY: 294 mg/dL — AB (ref 70–99)
GLUCOSE-CAPILLARY: 328 mg/dL — AB (ref 70–99)
Glucose-Capillary: 361 mg/dL — ABNORMAL HIGH (ref 70–99)
Glucose-Capillary: 301 mg/dL — ABNORMAL HIGH (ref 70–99)

## 2013-12-29 SURGERY — CYSTOSCOPY, FLEXIBLE
Anesthesia: Monitor Anesthesia Care

## 2013-12-29 MED ORDER — LIDOCAINE HCL (CARDIAC) 20 MG/ML IV SOLN
INTRAVENOUS | Status: DC | PRN
  Administered 2013-12-29: 100 mg via INTRAVENOUS

## 2013-12-29 MED ORDER — BACITRACIN ZINC 500 UNIT/GM EX OINT
TOPICAL_OINTMENT | CUTANEOUS | Status: AC
  Filled 2013-12-29: qty 28.35

## 2013-12-29 MED ORDER — PROPOFOL 10 MG/ML IV BOLUS
INTRAVENOUS | Status: AC
  Filled 2013-12-29: qty 20

## 2013-12-29 MED ORDER — HYDROCODONE-ACETAMINOPHEN 5-325 MG PO TABS: 1.0000 | ORAL_TABLET | Freq: Four times a day (QID) | ORAL | Status: AC | PRN

## 2013-12-29 MED ORDER — LIDOCAINE HCL 2 % EX GEL
CUTANEOUS | Status: AC
  Filled 2013-12-29: qty 10

## 2013-12-29 MED ORDER — LIDOCAINE HCL 2 % EX GEL
CUTANEOUS | Status: DC | PRN
  Administered 2013-12-29: 1 via URETHRAL

## 2013-12-29 MED ORDER — BELLADONNA ALKALOIDS-OPIUM 16.2-60 MG RE SUPP
RECTAL | Status: AC
  Filled 2013-12-29: qty 1

## 2013-12-29 MED ORDER — LIDOCAINE HCL 1 % IJ SOLN
INTRAMUSCULAR | Status: AC
Start: 2013-12-29 — End: 2013-12-29
  Filled 2013-12-29: qty 20

## 2013-12-29 MED ORDER — MIDAZOLAM HCL 2 MG/2ML IJ SOLN
INTRAMUSCULAR | Status: AC
  Filled 2013-12-29: qty 2

## 2013-12-29 MED ORDER — CIPROFLOXACIN IN D5W 400 MG/200ML IV SOLN
INTRAVENOUS | Status: AC
  Filled 2013-12-29: qty 200

## 2013-12-29 MED ORDER — INSULIN ASPART 100 UNIT/ML ~~LOC~~ SOLN
0.0000 [IU] | SUBCUTANEOUS | Status: DC
  Administered 2013-12-29: 15 [IU] via SUBCUTANEOUS
  Filled 2013-12-29: qty 1

## 2013-12-29 MED ORDER — ONDANSETRON HCL 4 MG/2ML IJ SOLN
INTRAMUSCULAR | Status: DC | PRN
  Administered 2013-12-29: 4 mg via INTRAVENOUS

## 2013-12-29 MED ORDER — MIDAZOLAM HCL 5 MG/5ML IJ SOLN
INTRAMUSCULAR | Status: DC | PRN
  Administered 2013-12-29: 2 mg via INTRAVENOUS

## 2013-12-29 MED ORDER — FENTANYL CITRATE 0.05 MG/ML IJ SOLN: 25.0000 ug | INTRAMUSCULAR | Status: DC | PRN

## 2013-12-29 MED ORDER — LACTATED RINGERS IV SOLN
INTRAVENOUS | Status: DC
Start: 2013-12-29 — End: 2013-12-29
  Administered 2013-12-29: 10:00:00 via INTRAVENOUS

## 2013-12-29 MED ORDER — CIPROFLOXACIN IN D5W 400 MG/200ML IV SOLN
400.0000 mg | INTRAVENOUS | Status: AC
  Administered 2013-12-29: 400 mg via INTRAVENOUS

## 2013-12-29 MED ORDER — FENTANYL CITRATE 0.05 MG/ML IJ SOLN
INTRAMUSCULAR | Status: DC | PRN
  Administered 2013-12-29: 25 ug via INTRAVENOUS

## 2013-12-29 MED ORDER — BUPIVACAINE HCL (PF) 0.25 % IJ SOLN
INTRAMUSCULAR | Status: AC
  Filled 2013-12-29: qty 30

## 2013-12-29 MED ORDER — LIDOCAINE HCL (PF) 1 % IJ SOLN
INTRAMUSCULAR | Status: DC | PRN
  Administered 2013-12-29: 20 mL

## 2013-12-29 MED ORDER — PROPOFOL INFUSION 10 MG/ML OPTIME
INTRAVENOUS | Status: DC | PRN
  Administered 2013-12-29: 75 ug/kg/min via INTRAVENOUS

## 2013-12-29 MED ORDER — LACTATED RINGERS IV SOLN: INTRAVENOUS | Status: DC

## 2013-12-29 MED ORDER — ONDANSETRON HCL 4 MG/2ML IJ SOLN
INTRAMUSCULAR | Status: AC
  Filled 2013-12-29: qty 2

## 2013-12-29 MED ORDER — BUPIVACAINE HCL (PF) 0.25 % IJ SOLN
INTRAMUSCULAR | Status: DC | PRN
  Administered 2013-12-29: 20 mL

## 2013-12-29 MED ORDER — LIDOCAINE HCL (CARDIAC) 20 MG/ML IV SOLN
INTRAVENOUS | Status: AC
  Filled 2013-12-29: qty 5

## 2013-12-29 MED ORDER — FENTANYL CITRATE 0.05 MG/ML IJ SOLN
INTRAMUSCULAR | Status: AC
  Filled 2013-12-29: qty 2

## 2013-12-29 SURGICAL SUPPLY — 42 items
BAG URO CATCHER STRL LF (DRAPE) IMPLANT
BANDAGE COBAN STERILE 2 (GAUZE/BANDAGES/DRESSINGS) ×3 IMPLANT
BLADE SURG 15 STRL LF DISP TIS (BLADE) ×1 IMPLANT
BLADE SURG 15 STRL SS (BLADE) ×2
BNDG COHESIVE 3X5 TAN STRL LF (GAUZE/BANDAGES/DRESSINGS) ×3 IMPLANT
CATH ROBINSON RED A/P 16FR (CATHETERS) IMPLANT
CATH ROBINSON RED A/P 18FR (CATHETERS) IMPLANT
COVER SURGICAL LIGHT HANDLE (MISCELLANEOUS) ×3 IMPLANT
CYSTOGRAFIN 30% 250ML (MISCELLANEOUS) IMPLANT
DECANTER SPIKE VIAL GLASS SM (MISCELLANEOUS) IMPLANT
DRAPE CAMERA CLOSED 9X96 (DRAPES) ×3 IMPLANT
DRAPE LAPAROTOMY T 102X78X121 (DRAPES) ×3 IMPLANT
DRSG XEROFORM 1X8 (GAUZE/BANDAGES/DRESSINGS) ×3 IMPLANT
ELECT NEEDLE TIP 2.8 STRL (NEEDLE) ×3 IMPLANT
ELECT REM PT RETURN 9FT ADLT (ELECTROSURGICAL) ×3
ELECTRODE REM PT RTRN 9FT ADLT (ELECTROSURGICAL) ×1 IMPLANT
GAUZE SPONGE 4X4 16PLY XRAY LF (GAUZE/BANDAGES/DRESSINGS) ×3 IMPLANT
GAUZE XEROFORM 1X8 LF (GAUZE/BANDAGES/DRESSINGS) ×3 IMPLANT
GLOVE BIOGEL M STRL SZ7.5 (GLOVE) ×6 IMPLANT
GOWN STRL REUS W/TWL LRG LVL3 (GOWN DISPOSABLE) ×3 IMPLANT
GOWN STRL REUS W/TWL XL LVL3 (GOWN DISPOSABLE) ×3 IMPLANT
KIT BASIN OR (CUSTOM PROCEDURE TRAY) ×3 IMPLANT
MANIFOLD NEPTUNE II (INSTRUMENTS) ×3 IMPLANT
NDL SAFETY ECLIPSE 18X1.5 (NEEDLE) IMPLANT
NEEDLE HYPO 18GX1.5 SHARP (NEEDLE)
NEEDLE HYPO 22GX1.5 SAFETY (NEEDLE) ×9 IMPLANT
NEEDLE HYPO 25X1 1.5 SAFETY (NEEDLE) IMPLANT
NS IRRIG 1000ML POUR BTL (IV SOLUTION) ×3 IMPLANT
PACK BASIC VI WITH GOWN DISP (CUSTOM PROCEDURE TRAY) IMPLANT
PACK CYSTO (CUSTOM PROCEDURE TRAY) IMPLANT
PACK GENERAL/GYN (CUSTOM PROCEDURE TRAY) ×3 IMPLANT
PENCIL BUTTON HOLSTER BLD 10FT (ELECTRODE) ×3 IMPLANT
SET IRRIG Y TYPE TUR BLADDER L (SET/KITS/TRAYS/PACK) ×3 IMPLANT
SPONGE GAUZE 4X4 12PLY (GAUZE/BANDAGES/DRESSINGS) ×3 IMPLANT
SUT VIC AB 3-0 PS2 18 (SUTURE)
SUT VIC AB 3-0 PS2 18XBRD (SUTURE) IMPLANT
SUT VIC AB 4-0 PS2 27 (SUTURE) ×6 IMPLANT
SYR BULB IRRIGATION 50ML (SYRINGE) IMPLANT
SYR CONTROL 10ML LL (SYRINGE) IMPLANT
TUBING CONNECTING 10 (TUBING) IMPLANT
TUBING CONNECTING 10' (TUBING)
WATER STERILE IRR 1500ML POUR (IV SOLUTION) IMPLANT

## 2013-12-29 NOTE — Progress Notes (Signed)
Notified Dr. Leta JunglingEwell of pt's elevated CBG.  Orders given see MAR.

## 2013-12-29 NOTE — Progress Notes (Signed)
pacu nursing:  Note in chart states that patient has no one to be with him tonight.  Spoke with Dr. Isabel CapriceGrapey and Dr. Leta JunglingEwell, pt states he has very close neighbors that will be checking in on him tonight.  He also has a [employer] alert response button that he wears around his neck, in addition he has two emergency pulls in his apartment.  Per Isabel CapriceGrapey and Ewell, pt had such light sedation for this procedure, there is no reason he cannot be discharge home today.  Darrol Angelobin Kathelene Rumberger, RN, CPAN

## 2013-12-29 NOTE — H&P (Signed)
Reason For Visit   [name] presents today with some new issues of apparent balanitis and phimosis as well as ongoing voiding complaints and dysuria. [name] is currently 58 years of age. I saw him on 1 occasion about 2 years ago as a referral for an elevated PSA. At that time, he really was having some significant medical issues, including what was felt to be potentially endstage ischemic cardiomyopathy and renal failure. He was sent with a mildly elevated PSA. At that time, apparently a cardiac transplant had been recommended, which he did not want to undergo. He felt that given his substantial medical comorbidities that really pursuing a prostate biopsy was not indicated. It is unclear whether he has had any PSA testing since that time, but it does appear that overall his health has improved. He apparently has had significant improvement in cardiac function, although I do not have any records from his cardiologist or primary care physician. He has intermittently had some ongoing renal issues, but apparently things have stabilized. When I saw him, he clearly had incomplete bladder emptying with significant voiding complaints and an elevated postvoid residual. I put him on Flomax, which he believes helped him, but he is no longer taking that medication. More recently, he has been treated for what sounds like balanitis and has had ongoing and progressive difficulty retracting his foreskin. He has a significant degree of obstructive and irritative voiding symptoms. He is unaware of any additional PSA testing. He tells me he currently resides in an assisted living and works several days a week. [name] is on anticoagulation. They did stop it about 6 months ago for laparoscopic cholecystectomy, which he made it through fairly well.     History of Present Illness   Past Gu Hx:    Mr. [name] presented in 2013 as a referral from Dr. Eliott Nine as well as Dr. Ardelle Park for further assessment of an apparent  elevated PSA as well as some progressive voiding symptoms. Mr. [name] is currently 58 years of age. The patient has had some progressive voiding complaints that have worsened recently. He does not have too much in the way of frequency or nocturia but does have significant issues with urinary urgency but also a weak stream, straining to void and intermittent flow. His current AUA symptom score is 21 with a bothersome score of 6. In addition, the patient apparently has been noted to have an elevated PSA. We initially did not have any of those records. We have subsequently been able to get some records faxed over and his PSA in March of 2013 was 6.5. We have asked for historic readings but it does not appear that there were any. There is no family history of prostate cancer.     The patient's medical history is quite complex. The patient has an apparent severe ischemic cardiomyopathy. He has had significant issues with cardiac arrhythmias. The patient has insulin requiring diabetes mellitus. He has had some chronic renal insufficiency. Recently a creatinine went up as high 3 or 4 but has apparently improved. The patient has refused any aggressive therapies and has already made it known that he is not interested in dialysis or any other heroic measures.        Past Medical History Problems  1. History of Acute Myocardial Infarction (V12.59) 2. History of Arthritis (V13.4) 3. History of Asthma (493.90) 4. History of Atrial fibrillation (427.31) 5. History of Chronic Liver Disease (571.9) 6. History of Disorder of heart rhythm (427.9)  7. History of Gout (274.9) 8. History of cardiac disorder (V12.50) 9. History of congestive heart disease (V12.59) 10. History of depression (V11.8) 11. History of diabetes mellitus (V12.29) 12. History of heartburn (V12.79) 13. History of hypercholesterolemia (V12.29) 14. History of hypertension (V12.59) 15. History of hypothyroidism (V12.29) 16. History of Renal  failure (586) 17. History of Stroke syndrome (436) 18. History of Thromboembolic Disease (V12.52)  Surgical History Problems  1. History of Cholecystectomy 2. History of Foot Surgery 3. History of Heart Surgery 4. History of Pacemaker Placement  Current Meds 1. Amiodarone HCl - 200 MG Oral Tablet;  Therapy: 15Jun2012 to Recorded 2. Brilinta 90 MG Oral Tablet;  Therapy: 15Jun2012 to Recorded 3. Carvedilol 6.25 MG Oral Tablet;  Therapy: 24Mar2012 to Recorded 4. Gabapentin 300 MG Oral Capsule;  Therapy: 02Jan2013 to Recorded 5. GlipiZIDE XL 5 MG Oral Tablet Extended Release 24 Hour;  Therapy: 13Mar2013 to Recorded 6. Insulin;  Therapy: (Recorded:16Jan2015) to Recorded 7. Isosorbide Mononitrate ER 60 MG Oral Tablet Extended Release 24 Hour;  Therapy: 31Dec2012 to Recorded 8. MethylPREDNISolone (Pak) 4 MG Oral Tablet;  Therapy: (Recorded:29Apr2013) to Recorded 9. Multi-Day Vitamins TABS;  Therapy: (Recorded:16Jan2015) to Recorded 10. Omeprazole 40 MG Oral Capsule Delayed Release;   Therapy: 02Jan2013 to Recorded 11. Potassium TABS;   Therapy: (Recorded:16Jan2015) to Recorded 12. Spironolactone 25 MG Oral Tablet;   Therapy: 15Mar2013 to Recorded 13. Tamsulosin HCl - 0.4 MG Oral Capsule; TAKE 1 CAPSULE EVERY DAY;   Therapy: 29Apr2013 to (Last Rx:29Apr2013)  Requested for: 29Apr2013 Ordered 14. Torsemide TABS;   Therapy: (Recorded:16Jan2015) to Recorded 15. Vitamin B-2 50 MG Oral Tablet;   Therapy: (Recorded:16Jan2015) to Recorded  Allergies Medication  1. Cephalosporins 2. Morphine Derivatives 3. Penicillins  Family History Problems  1. Family history of Acute Myocardial Infarction (V17.3) : Father 2. Family history of Cancer : Mother 3. Family history of Death In The Family Father : Father   6656yrs, MI 4. Family history of Death In The Family Mother : Mother   605yrs, pancreatic ca 5. Denied: Family history of Family Health Status Number Of Children 6. Family  history of Heart Disease (V17.49) : Father 7. Family history of Pancreatic Neoplasm : Mother  Social History Problems  1. Denied: History of Alcohol Use 2. Denied: History of Caffeine Use 3. Marital History - Divorced (V61.03) 4. Never A Smoker 5. Occupation:   funeral attendant 6. Denied: History of Tobacco Use  Review of Systems Genitourinary, constitutional, skin, eye, otolaryngeal, hematologic/lymphatic, cardiovascular, pulmonary, endocrine, musculoskeletal, gastrointestinal, neurological and psychiatric system(s) were reviewed and pertinent findings if present are noted.  Genitourinary: urinary frequency, nocturia, difficulty starting the urinary stream, weak urinary stream, urinary stream starts and stops, incomplete emptying of bladder, erectile dysfunction and initiating urination requires straining.  Gastrointestinal: heartburn and constipation.  Constitutional: feeling tired (fatigue).  Eyes: blurred vision.  ENT: sinus problems.  Hematologic/Lymphatic: a tendency to easily bruise.  Cardiovascular: leg swelling, but no chest pain.  Respiratory: cough, but no shortness of breath.  Psychiatric: depression.    Vitals Vital Signs [Data Includes: Last 1 Day]  Recorded: 16Jan2015 10:37AM  Blood Pressure: 106 / 64 Temperature: 98.1 F Heart Rate: 69  Physical Exam Rectal: Rectal exam demonstrates normal sphincter tone, no tenderness and no masses. Estimated prostate size is 2+. Normal rectal tone, no rectal masses, prostate is smooth, symmetric and non-tender. The prostate has no nodularity and is not tender. The left seminal vesicle is nonpalpable. The right seminal vesicle is nonpalpable. The perineum is  normal on inspection.  Genitourinary: Examination of the penis demonstrates phimosis. The penis is uncircumcised. The scrotum is normal in appearance. The right testis is palpably normal. The left testis is normal.    Results/Data Urine [Data Includes: Last 1 Day]    16Jan2015  COLOR YELLOW   APPEARANCE CLOUDY   SPECIFIC GRAVITY <1.005   pH 6.0   GLUCOSE > 1000 mg/dL  BILIRUBIN NEG   KETONE NEG mg/dL  BLOOD SMALL   PROTEIN NEG mg/dL  UROBILINOGEN 0.2 mg/dL  NITRITE NEG   LEUKOCYTE ESTERASE LARGE   SQUAMOUS EPITHELIAL/HPF MODERATE   WBC 11-20 WBC/hpf  RBC 11-20 RBC/hpf  BACTERIA MANY   CRYSTALS NONE SEEN   CASTS NONE SEEN    Assessment Assessed  1. Incomplete bladder emptying (788.21) 2. Benign prostatic hyperplasia with urinary obstruction (600.01,599.69) 3. Elevated prostate specific antigen (PSA) (790.93) 4. Phimosis (605) 5. Pyuria (791.9)  Plan Elevated prostate specific antigen (PSA)  1. PSA REFLEX TO FREE; Status:Hold For - Specimen/Data Collection,Appointment;  Requested for:16Jan2015;  Health Maintenance  2. UA With REFLEX; [Do Not Release]; Status:Complete;   Done: 16Jan2015 10:11AM Incomplete bladder emptying  3. BASIC METABOLIC PANEL; Status:Hold For - Specimen/Data Collection,Appointment;  Requested for:16Jan2015;  Pyuria  4. URINE CULTURE; Status:Hold For - Specimen/Data Collection,Appointment; Requested  for:16Jan2015;   Discussion/Summary   There are several issues that we need to sort out for [name]. Again, when he was initially sent to see me, he did have an elevated PSA, but we felt that given what was happening to him medically at that time indicated that it was not worth pursuing his PSA elevation. Clearly, his medical status has stabilized and I do think we need to at least address the PSA and to be sure there has not been a dramatic change. I really do not have any data on him with regard to recent blood work, so we will request any PSAs, renal function studies, etc, over the last year performed through his primary care provider's office. He does have considerable ongoing obstructive and irritative voiding symptoms. Urinalysis today is abnormal and it is difficult to know if this is truly cystitis or just some  inflammation and contamination from his fairly severe phimosis. A urine culture will be done today. It does appear that he would benefit from circumcision given his ongoing symptoms and severe phimosis. He did recently have gallbladder surgery without incident. In this case, I would recommend IV sedation with penile block rather than general anesthesia, although he would need to stop his blood thinner for the appropriate time frame. We would potentially need to get some additional records along with the lab work. I do think he would benefit from another trial of Flomax to see if we can improve his voiding and we will send in a prescription for him.

## 2013-12-29 NOTE — Op Note (Signed)
Preoperative diagnosis: Phimosis/ BPH Postoperative diagnosis: Same  Procedure: Circumcision/flexible cystoscopy Surgeon: Valetta Fulleravid S. Manasvini Whatley M.D. Anesthesia:Mac. with penile block  Indications: The patient was recently evaluated for phimosis. All risks and benefits of circumcision discussed. Full informed consent obtained. The patient now presents for definitive procedure. Patient is a known diabetic with multiple medical problems. He has severe phimosis and is had an inability to retract his foreskin. He is also had ongoing voiding symptoms and presents for circumcision as well as flexible cystoscopy  Technique and findings: The patient was brought to the operating room. Successful induction of general anesthesia. A penile block was then performed with 10 cc of quarter percent Marcaine. The patient was then prepped and draped in usual manner. Appropriate surgical timeout was performed. A circumferential incision was made behind the glans penis. The foreskin was not able to be retracted and therefore a dorsal slit was performed. A second circumferential incision was made within the mucosal collar. The sleeve of redundant skin was removed. Skin edges were reapproximated with interrupted 4-0 Vicryl suture. The incision was wrapped with Xeroform gauze with bacitracin ointment. A Coban dressing was loosely wrapped. Flexible cystoscopy was performed with lidocaine jelly. The patient had mild trilobar hyperplasia with a prominent median bar. Prostatic urethra measured approximately 3 cm. The bladder showed mild trabeculation but no other pathology. The patient was brought to recovery room in stable condition having had no obvious complications or problems. Sponge and needle counts were correct.

## 2013-12-29 NOTE — Progress Notes (Signed)
pacu nursing:  Blood glucose 361 - dr. Leta Junglingewell aware, no orders for further insulin at this time

## 2013-12-29 NOTE — Anesthesia Preprocedure Evaluation (Addendum)
Anesthesia Evaluation  Patient identified by MRN, date of birth, ID band Patient awake    Reviewed: Allergy & Precautions, H&P , NPO status , Patient's Chart, lab work & pertinent test results, reviewed documented beta blocker date and time   History of Anesthesia Complications (+) PONV  Airway Mallampati: II TM Distance: >3 FB Neck ROM: full    Dental no notable dental hx. (+) Teeth Intact and Dental Advisory Given   Pulmonary shortness of breath and with exertion,  breath sounds clear to auscultation  Pulmonary exam normal       Cardiovascular Exercise Tolerance: Poor hypertension, Pt. on home beta blockers and Pt. on medications + CAD, + Past MI and +CHF + dysrhythmias Atrial Fibrillation + pacemaker + Cardiac Defibrillator Rhythm:regular Rate:Normal  Systolic and diastolic heart failure   Neuro/Psych CVA, Residual Symptoms negative neurological ROS  negative psych ROS   GI/Hepatic negative GI ROS, hiatal hernia, GERD-  Medicated and Controlled,(+) Cirrhosis -       , Hepatitis -  Endo/Other  diabetes, Poorly Controlled, Type 2, Insulin Dependent  Renal/GU Renal diseaseStage 3 chronic kidney disease  negative genitourinary   Musculoskeletal   Abdominal (+) - obese,   Peds  Hematology negative hematology ROS (+)   Anesthesia Other Findings   Reproductive/Obstetrics negative OB ROS                        Anesthesia Physical Anesthesia Plan  ASA: IV  Anesthesia Plan: MAC   Post-op Pain Management:    Induction:   Airway Management Planned: Simple Face Mask  Additional Equipment:   Intra-op Plan:   Post-operative Plan:   Informed Consent: I have reviewed the patients History and Physical, chart, labs and discussed the procedure including the risks, benefits and alternatives for the proposed anesthesia with the patient or authorized representative who has indicated his/her  understanding and acceptance.   Dental Advisory Given  Plan Discussed with: CRNA and Surgeon  Anesthesia Plan Comments:         Anesthesia Quick Evaluation

## 2013-12-29 NOTE — Anesthesia Postprocedure Evaluation (Signed)
  Anesthesia Post-op Note  Patient: [name]  Procedure(s) Performed: Procedure(s) (LRB): CYSTOSCOPY FLEXIBLE AND CIRCUMCISION (N/A)  Patient Location: PACU  Anesthesia Type: MAC  Level of Consciousness: awake and alert   Airway and Oxygen Therapy: Patient Spontanous Breathing  Post-op Pain: mild  Post-op Assessment: Post-op Vital signs reviewed, Patient's Cardiovascular Status Stable, Respiratory Function Stable, Patent Airway and No signs of Nausea or vomiting  Last Vitals:  Filed Vitals:   12/29/13 1230  BP: 107/54  Pulse: 61  Temp: 36.9 C  Resp: 20    Post-op Vital Signs: stable   Complications: No apparent anesthesia complications

## 2013-12-29 NOTE — Transfer of Care (Signed)
Immediate Anesthesia Transfer of Care Note  Patient: [name]  Procedure(s) Performed: Procedure(s): CYSTOSCOPY FLEXIBLE AND CIRCUMCISION (N/A)  Patient Location: PACU  Anesthesia Type:MAC  Level of Consciousness: awake, alert , oriented and patient cooperative  Airway & Oxygen Therapy: Patient Spontanous Breathing and Patient connected to nasal cannula oxygen  Post-op Assessment: Report given to PACU RN, Post -op Vital signs reviewed and stable and Patient moving all extremities  Post vital signs: Reviewed and stable  Complications: No apparent anesthesia complications

## 2013-12-29 NOTE — Discharge Instructions (Signed)
Postoperative instructions for circumcision  Wound:  Remove the dressing the morning after surgery. In most cases your incision will have absorbable sutures that run along the course of your incision and will dissolve within the first 10-20 days. Some will fall out even earlier. Expect some redness as the sutures dissolved but this should occur only around the sutures. If there is generalized redness, especially with increasing pain or swelling, let us know. The penis will very likely get "black and blue" as the blood in the tissues spread. Sometimes the whole penis will turn colors. The black and blue is followed by a yellow and brown color. In time, all the discoloration will go away.  Diet:  You may return to your normal diet within 24 hours following your surgery. You may note some mild nausea and possibly vomiting the first 6-8 hours following surgery. This is usually due to the side effects of anesthesia, and will disappear quite soon. I would suggest clear liquids and a very light meal the first evening following your surgery.  Activity:  Your physical activity should be restricted the first 48 hours. During that time you should remain relatively inactive, moving about only when necessary. During the first 7-10 days following surgery he should avoid lifting any heavy objects (anything greater than 15 pounds), and avoid strenuous exercise. If you work, ask us specifically about your restrictions, both for work and home. We will write a note to your employer if needed.  Ice packs can be placed on and off over the penis for the first 48 hours to help relieve the pain and keep the swelling down. Frozen peas or corn in a ZipLock bag can be frozen, used and re-frozen. Fifteen minutes on and 15 minutes off is a reasonable schedule.  No sexual activity for 1 month.  Hygiene:  You may shower 48 hours after your surgery. Tub bathing should be restricted until the seventh day.  Medication:  You  will be sent home with some type of pain medication. In many cases you will be sent home with a narcotic pain pill (Vicodin or Tylox). If the pain is not too bad, you may take either Tylenol (acetaminophen) or Advil (ibuprofen) which contain no narcotic agents, and might be tolerated a little better, with fewer side effects. If the pain medication you are sent home with does not control the pain, you will have to let us know. Some narcotic pain medications cannot be given or refilled by a phone call to a pharmacy.  Problems you should report to us:   Fever of 101.0 degrees Fahrenheit or greater.  Moderate or severe swelling under the skin incision or involving the scrotum.  Drug reaction such as hives, a rash, nausea or vomiting.

## 2013-12-29 NOTE — Preoperative (Signed)
Coreg taken Beta Blockers   Reason not to administer Beta Blockers:Coreg taken at 0715 12-29-13

## 2013-12-29 NOTE — Interval H&P Note (Signed)
History and Physical Interval Note:  12/29/2013 11:01 AM  Quinn Plowmanobert E Apo  has presented today for surgery, with the diagnosis of PHIMOSIS, BENIGN PROSTATIC HYPERTROPHY  The various methods of treatment have been discussed with the patient and family. After consideration of risks, benefits and other options for treatment, the patient has consented to  Procedure(s): CYSTOSCOPY FLEXIBLE AND CIRCUMCISION (N/A) CIRCUMCISION ADULT (N/A) as a surgical intervention .  The patient's history has been reviewed, patient examined, no change in status, stable for surgery.  I have reviewed the patient's chart and labs.  Questions were answered to the patient's satisfaction.     Crystalina Stodghill S

## 2013-12-30 ENCOUNTER — Encounter (HOSPITAL_COMMUNITY): Payer: Self-pay | Admitting: Urology

## 2013-12-30 LAB — GLUCOSE, CAPILLARY: Glucose-Capillary: 385 mg/dL — ABNORMAL HIGH (ref 70–99)

## 2014-01-29 ENCOUNTER — Ambulatory Visit (INDEPENDENT_AMBULATORY_CARE_PROVIDER_SITE_OTHER): Payer: Medicare Other

## 2014-01-29 VITALS — BP 105/61 | HR 63 | Resp 18

## 2014-01-29 DIAGNOSIS — B351 Tinea unguium: Secondary | ICD-10-CM

## 2014-01-29 DIAGNOSIS — E11610 Type 2 diabetes mellitus with diabetic neuropathic arthropathy: Secondary | ICD-10-CM

## 2014-01-29 DIAGNOSIS — E1142 Type 2 diabetes mellitus with diabetic polyneuropathy: Secondary | ICD-10-CM

## 2014-01-29 DIAGNOSIS — E1149 Type 2 diabetes mellitus with other diabetic neurological complication: Secondary | ICD-10-CM

## 2014-01-29 DIAGNOSIS — Q828 Other specified congenital malformations of skin: Secondary | ICD-10-CM

## 2014-01-29 DIAGNOSIS — E1140 Type 2 diabetes mellitus with diabetic neuropathy, unspecified: Secondary | ICD-10-CM

## 2014-01-29 DIAGNOSIS — A5211 Tabes dorsalis: Secondary | ICD-10-CM

## 2014-01-29 DIAGNOSIS — L97409 Non-pressure chronic ulcer of unspecified heel and midfoot with unspecified severity: Secondary | ICD-10-CM

## 2014-01-29 DIAGNOSIS — M79609 Pain in unspecified limb: Secondary | ICD-10-CM

## 2014-01-29 MED ORDER — SILVER SULFADIAZINE 1 % EX CREA: 1.0000 "application " | TOPICAL_CREAM | Freq: Every day | CUTANEOUS | Status: AC

## 2014-01-29 NOTE — Progress Notes (Signed)
° °  Subjective:    Patient ID: [name], male    DOB: [birth year], 58 y.o.   MRN: [id number]  HPI I am here to get my toenails and calluses trimmed up and when I was here last time Dr. Ralene CorkSikora trimmed my callus on the left foot a little close and two days later it was bleeding and was in the hospital for prostate to clean it out and that was Feb 2nd 2015    Review of Systems no changes or findings at this time     Objective:   Physical Exam Cristopher PeruO'Shea objective findings follows vascular status is intact pedal pulses palpable DP +2/4 PT plus one over 4 bilateral Refill time 3 seconds all digits patient significant Charcot changes of left foot with collapsed medial arch medial column has prominence of the cuboid and central metatarsals the plantar left midfoot. Patient did experience some bleeding that area exam this time reveals there is a fissure in the skin patient also psoriatic skin arthritic changes of the fissure show some dry blood discharge early ulceration with a skin tear under the bony prominence sub-cuboid left foot. Neurologically has profound diabetic neuropathy with absence of epicritic and proprioceptive sensations absence of DTRs noted as well. Normal plantar response noted dermatologically there is a fissure ulceration sub-cuboid left foot is also hemorrhage a keratoses subsecond MTP area right foot and diffuse keratoses and plantar arch of the left as well. Patient has Shoes with the AFO double upright brace on his left foot with pocketing for the Charcot change of his left foot. Additional padding and pocketing to create at this time with a felt padding being applied to the underside of the Plastizote insoles in his left shoe. Nails thick brittle friable criptotic dystrophic debrided x8 and the presence of diabetes complications as well as partial indications the past left foot to       Assessment & Plan:  Assessment this time his diabetes with neuropathy and angiopathy patient  has a Charcot changes left foot with keratoses plantar aspect of both feet and pre-ulceration with skin fissure left foot sub-cuboid and Charcot changes. Multiple dystrophic friable nails 1 through 5 right 134 left are debridement of painful mycotic friable discolored brittle nails debridement presence of diabetes and complications also does have multiple keratoses debrided the ulcer site is cleansed with all cleansed Silvadene and gauze dressing is applied prescription for Silvadene is furniture patient apply daily with dressing changes as instructed continue to offload reappointed normal one month for followup and ulcer check and likely 2-3 months for continued diabetic foot and nail care.  Alvan Dameichard Sikora DPM

## 2014-01-29 NOTE — Patient Instructions (Addendum)
ANTIBACTERIAL SOAP INSTRUCTIONS  THE DAY AFTER PROCEDURE  Please follow the instructions your doctor has marked.   Shower as usual. Before getting out, place a drop of antibacterial liquid soap (Dial) on a wet, clean washcloth.  Gently wipe washcloth over affected area.  Afterward, rinse the area with warm water.  Blot the area dry with a soft cloth and cover with antibiotic ointment (neosporin, polysporin, bacitracin) and band aid or gauze and tape  Place 3-4 drops of antibacterial liquid soap in a quart of warm tap water.  Submerge foot into water for 20 minutes.  If bandage was applied after your procedure, leave on to allow for easy lift off, then remove and continue with soak for the remaining time.  Next, blot area dry with a soft cloth and cover with a bandage.  Apply other medications as directed by your doctor, apply the Silvadene cream which was prescribed with a light gauze and Silvadene cream dressing on the bottom of the left foot ulcer site change dressing once daily after bathing or showering with soap and water maintain dressing changes until wound is resolved.    Diabetes and Foot Care Diabetes may cause you to have problems because of poor blood supply (circulation) to your feet and legs. This may cause the skin on your feet to become thinner, break easier, and heal more slowly. Your skin may become dry, and the skin may peel and crack. You may also have nerve damage in your legs and feet causing decreased feeling in them. You may not notice minor injuries to your feet that could lead to infections or more serious problems. Taking care of your feet is one of the most important things you can do for yourself.  HOME CARE INSTRUCTIONS  Wear shoes at all times, even in the house. Do not go barefoot. Bare feet are easily injured.  Check your feet daily for blisters, cuts, and redness. If you cannot see the bottom of your feet, use a mirror or ask someone for help.  Wash your feet  with warm water (do not use hot water) and mild soap. Then pat your feet and the areas between your toes until they are completely dry. Do not soak your feet as this can dry your skin.  Apply a moisturizing lotion or petroleum jelly (that does not contain alcohol and is unscented) to the skin on your feet and to dry, brittle toenails. Do not apply lotion between your toes.  Trim your toenails straight across. Do not dig under them or around the cuticle. File the edges of your nails with an emery board or nail file.  Do not cut corns or calluses or try to remove them with medicine.  Wear clean socks or stockings every day. Make sure they are not too tight. Do not wear knee-high stockings since they may decrease blood flow to your legs.  Wear shoes that fit properly and have enough cushioning. To break in new shoes, wear them for just a few hours a day. This prevents you from injuring your feet. Always look in your shoes before you put them on to be sure there are no objects inside.  Do not cross your legs. This may decrease the blood flow to your feet.  If you find a minor scrape, cut, or break in the skin on your feet, keep it and the skin around it clean and dry. These areas may be cleansed with mild soap and water. Do not cleanse the area with  peroxide, alcohol, or iodine.  When you remove an adhesive bandage, be sure not to damage the skin around it.  If you have a wound, look at it several times a day to make sure it is healing.  Do not use heating pads or hot water bottles. They may burn your skin. If you have lost feeling in your feet or legs, you may not know it is happening until it is too late.  Make sure your health care provider performs a complete foot exam at least annually or more often if you have foot problems. Report any cuts, sores, or bruises to your health care provider immediately. SEEK MEDICAL CARE IF:   You have an injury that is not healing.  You have cuts or breaks  in the skin.  You have an ingrown nail.  You notice redness on your legs or feet.  You feel burning or tingling in your legs or feet.  You have pain or cramps in your legs and feet.  Your legs or feet are numb.  Your feet always feel cold. SEEK IMMEDIATE MEDICAL CARE IF:   There is increasing redness, swelling, or pain in or around a wound.  There is a red line that goes up your leg.  Pus is coming from a wound.  You develop a fever or as directed by your health care provider.  You notice a bad smell coming from an ulcer or wound. Document Released: 11/10/2000 Document Revised: 07/16/2013 Document Reviewed: 04/22/2013 [facility] Patient Information 2014 [employer], [place].

## 2014-02-26 ENCOUNTER — Ambulatory Visit (INDEPENDENT_AMBULATORY_CARE_PROVIDER_SITE_OTHER): Payer: Medicare Other

## 2014-02-26 VITALS — BP 123/63 | HR 67 | Resp 12

## 2014-02-26 DIAGNOSIS — E1140 Type 2 diabetes mellitus with diabetic neuropathy, unspecified: Secondary | ICD-10-CM

## 2014-02-26 DIAGNOSIS — E1142 Type 2 diabetes mellitus with diabetic polyneuropathy: Secondary | ICD-10-CM

## 2014-02-26 DIAGNOSIS — E11610 Type 2 diabetes mellitus with diabetic neuropathic arthropathy: Secondary | ICD-10-CM

## 2014-02-26 DIAGNOSIS — E1149 Type 2 diabetes mellitus with other diabetic neurological complication: Secondary | ICD-10-CM

## 2014-02-26 DIAGNOSIS — A5211 Tabes dorsalis: Secondary | ICD-10-CM

## 2014-02-26 DIAGNOSIS — L97409 Non-pressure chronic ulcer of unspecified heel and midfoot with unspecified severity: Secondary | ICD-10-CM

## 2014-02-26 DIAGNOSIS — M79609 Pain in unspecified limb: Secondary | ICD-10-CM

## 2014-02-26 NOTE — Patient Instructions (Signed)
ANTIBACTERIAL SOAP INSTRUCTIONS  THE DAY AFTER PROCEDURE  Please follow the instructions your doctor has marked.   Shower as usual. Before getting out, place a drop of antibacterial liquid soap (Dial) on a wet, clean washcloth.  Gently wipe washcloth over affected area.  Afterward, rinse the area with warm water.  Blot the area dry with a soft cloth and cover with antibiotic ointment (neosporin, polysporin, bacitracin) and band aid or gauze and tape  Place 3-4 drops of antibacterial liquid soap in a quart of warm tap water.  Submerge foot into water for 20 minutes.  If bandage was applied after your procedure, leave on to allow for easy lift off, then remove and continue with soak for the remaining time.  Next, blot area dry with a soft cloth and cover with a bandage. After drying thoroughly apply Silvadene and dry gauze dressing daily as instructed

## 2014-02-26 NOTE — Progress Notes (Signed)
   Subjective:    Patient ID: Quinn Plowmanobert E Forman, male    DOB: 1956/06/11, 58 y.o.   MRN: [id number]  HPI LT FOOT IS STILL SORE AND NOTHING CHANGE.   Review of Systems no new changes or findings at this time.     Objective:   Physical Exam Vascular status is intact and unchanged pedal pulses palpable DP postal for PT one over 4 bilateral capillary refill time 3 seconds significant Charcot change with prominence plantar midfoot and rear foot arch area of the left foot. There is hemorrhage a keratoses which and debridement reveals a small ulceration down and dermal level proximally a centimeter in diameter and a small skin tear a little more lateral of that same site the overall keratotic in bony prominence site is more than 3-4 cm in diameter plantar mid rear foot mid foot of the left foot. Again patient profound neuropathy is wearing his diabetic shoes with double upright brace AFO as well as custom diabetic insoles and will continue maintain this. This time there is cleansed with all cleansed Silvadene gauze dressing applied. No secondary infections no ascending psoas lymphangitis Mrs. 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 The ulcer is debrided hemorrhage a keratosis around the periphery is debrided to not have some your also down and dermal level only Silvadene gauze dressing applied and will be maintained by nursing facility her care facility. Orders are written for daily dressing changes with Silvadene gauze. Followup in the next month rash about 6-7 weeks for followup and ulcer check as well as likely diabetic foot and nail care  Alvan Dameichard Ritchie Klee DPM

## 2014-03-11 ENCOUNTER — Ambulatory Visit: Payer: Medicare Other

## 2014-03-12 ENCOUNTER — Ambulatory Visit (INDEPENDENT_AMBULATORY_CARE_PROVIDER_SITE_OTHER): Payer: Medicare Other

## 2014-03-12 VITALS — BP 121/55 | HR 80 | Temp 99.6°F | Resp 18

## 2014-03-12 DIAGNOSIS — E11610 Type 2 diabetes mellitus with diabetic neuropathic arthropathy: Secondary | ICD-10-CM

## 2014-03-12 DIAGNOSIS — L97409 Non-pressure chronic ulcer of unspecified heel and midfoot with unspecified severity: Secondary | ICD-10-CM

## 2014-03-12 DIAGNOSIS — E1140 Type 2 diabetes mellitus with diabetic neuropathy, unspecified: Secondary | ICD-10-CM

## 2014-03-12 DIAGNOSIS — R52 Pain, unspecified: Secondary | ICD-10-CM

## 2014-03-12 DIAGNOSIS — L02619 Cutaneous abscess of unspecified foot: Secondary | ICD-10-CM

## 2014-03-12 DIAGNOSIS — L03119 Cellulitis of unspecified part of limb: Secondary | ICD-10-CM

## 2014-03-12 NOTE — Patient Instructions (Signed)
Recommendations at this time for diabetic wound an ulcer of left foot.  Recommended immediate referral to [facility] emergency room. Have already contacted Dr. Glenice Laineicarlo Dial. [facility] Patient needs hospitalization for IV antibiotics MRI evaluation and possible surgical debridement of ulcer and foot care  [name] DPM

## 2014-03-12 NOTE — Progress Notes (Signed)
° °  Subjective:    Patient ID: [name], male    DOB: 04-02-1956, 58 y.o.   MRN: [id number]  HPI I am here to talk to the doctor about having surgery on my left foot    Review of Systems any systemic changes or findings at this time should note however patient apparently was placed on antibiotics 1 week ago by his primary physician was put on doxycycline and is still taking. Should also note he's been running a fever for the last 2-3 days current temperature today 99.6     Objective:   Physical Exam This is a 58 year old white male well-developed well-nourished oriented x3 does have long-standing history of diabetes profound neuropathy and Charcot changes of left foot previous minor ulceration of the dermal level has worsened x-rays at this time revealed severe collapse of the mid part of the foot and deterioration of the midfoot consistent with Charcot arthropathy however cannot rule out infective process if there is significant fever and temperature to the foot and leg. Patient also had fever and chills and the last several days. There is no purulent discharge or drainage no open sinus tract identified at this time superficial hemorrhage a keratosis and macerated tissue is debrided away area over 3 cm to 4 cm in the plantar aspect of the midfoot is debrided and cleansed at this time Iodosorb and gauze dressing applied. Should note pedal pulses are palpable DP postal for PT one over 4 profound neuropathy noted with absent sensation absent motor function patient is nonambulatory for the last 2-3 days in a wheelchair only as he couldn't fitting shoes on his foot again the foot has gotten more swollen painful edematous and deformed in recent days to       Assessment & Plan:  Assessment this time his diabetes profound neuropathy and Charcot changes which are likely exacerbated there is localized cellulitis of the foot leg and patient is wearing systemic fever at this time as well.  Recommendations for consultation IV antibiotic therapy and possible debridement at this time patient elected and my recommendation is referral to [facility] for admit through the hospitalist and followup care with Dr. dial who is RE been contacted regarding his transfer to [facility] to patient seemed to understand everything was explained was advised that he is at risk for losing his foot or leg may require aggressive debridement and hopefully his foot can't be saved with appropriate antibiotic therapy and surgical debridement. Caregiver present with the patient this time we'll arrange transports sometime within the next hour from the nursing facility to [facility].  Alvan Dameichard Sikora DPM

## 2014-04-09 ENCOUNTER — Ambulatory Visit: Payer: Medicare Other

## 2014-11-05 ENCOUNTER — Encounter (HOSPITAL_COMMUNITY): Payer: Self-pay | Admitting: Internal Medicine

## 2014-12-10 ENCOUNTER — Encounter (HOSPITAL_COMMUNITY): Payer: Self-pay | Admitting: Internal Medicine

## 2015-04-29 ENCOUNTER — Other Ambulatory Visit: Payer: Self-pay

## 2016-01-26 DEATH — deceased

## 2018-04-12 ENCOUNTER — Encounter: Payer: Self-pay | Admitting: Gastroenterology
